# Patient Record
Sex: Male | Born: 1980 | Race: White | Hispanic: No | Marital: Single | Smoking: Never smoker
Health system: Southern US, Community
[De-identification: ages and names within clinical notes are randomized; demographics above are authoritative.]

## PROBLEM LIST (undated history)

## (undated) DIAGNOSIS — K55069 Acute infarction of intestine, part and extent unspecified: Secondary | ICD-10-CM

## (undated) DIAGNOSIS — Z7901 Long term (current) use of anticoagulants: Secondary | ICD-10-CM

## (undated) DIAGNOSIS — T7840XA Allergy, unspecified, initial encounter: Secondary | ICD-10-CM

## (undated) DIAGNOSIS — D509 Iron deficiency anemia, unspecified: Secondary | ICD-10-CM

## (undated) DIAGNOSIS — D649 Anemia, unspecified: Secondary | ICD-10-CM

## (undated) DIAGNOSIS — S62609A Fracture of unspecified phalanx of unspecified finger, initial encounter for closed fracture: Secondary | ICD-10-CM

## (undated) DIAGNOSIS — Z5189 Encounter for other specified aftercare: Secondary | ICD-10-CM

## (undated) DIAGNOSIS — I81 Portal vein thrombosis: Secondary | ICD-10-CM

## (undated) DIAGNOSIS — I2699 Other pulmonary embolism without acute cor pulmonale: Secondary | ICD-10-CM

## (undated) DIAGNOSIS — M25562 Pain in left knee: Secondary | ICD-10-CM

## (undated) DIAGNOSIS — D689 Coagulation defect, unspecified: Secondary | ICD-10-CM

## (undated) DIAGNOSIS — N200 Calculus of kidney: Secondary | ICD-10-CM

## (undated) DIAGNOSIS — K766 Portal hypertension: Secondary | ICD-10-CM

## (undated) DIAGNOSIS — I8500 Esophageal varices without bleeding: Secondary | ICD-10-CM

## (undated) DIAGNOSIS — R161 Splenomegaly, not elsewhere classified: Secondary | ICD-10-CM

## (undated) HISTORY — DX: Long term (current) use of anticoagulants: Z79.01

## (undated) HISTORY — DX: Other pulmonary embolism without acute cor pulmonale: I26.99

## (undated) HISTORY — DX: Coagulation defect, unspecified: D68.9

## (undated) HISTORY — DX: Splenomegaly, not elsewhere classified: R16.1

## (undated) HISTORY — PX: OTHER SURGICAL HISTORY: SHX169

## (undated) HISTORY — DX: Encounter for other specified aftercare: Z51.89

## (undated) HISTORY — DX: Fracture of unspecified phalanx of unspecified finger, initial encounter for closed fracture: S62.609A

## (undated) HISTORY — DX: Portal vein thrombosis: I81

## (undated) HISTORY — DX: Acute infarction of intestine, part and extent unspecified: K55.069

## (undated) HISTORY — DX: Allergy, unspecified, initial encounter: T78.40XA

## (undated) HISTORY — DX: Portal hypertension: K76.6

## (undated) HISTORY — DX: Esophageal varices without bleeding: I85.00

## (undated) HISTORY — DX: Anemia, unspecified: D64.9

## (undated) HISTORY — DX: Calculus of kidney: N20.0

## (undated) HISTORY — DX: Pain in left knee: M25.562

---
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
## 2004-04-14 ENCOUNTER — Ambulatory Visit: Payer: Self-pay | Admitting: Family Medicine

## 2004-07-02 ENCOUNTER — Ambulatory Visit: Payer: Self-pay | Admitting: Family Medicine

## 2005-02-11 ENCOUNTER — Ambulatory Visit: Payer: Self-pay | Admitting: Family Medicine

## 2005-02-17 ENCOUNTER — Ambulatory Visit: Payer: Self-pay | Admitting: Family Medicine

## 2005-03-24 ENCOUNTER — Ambulatory Visit: Payer: Self-pay | Admitting: Family Medicine

## 2007-11-14 ENCOUNTER — Ambulatory Visit: Payer: Self-pay | Admitting: Family Medicine

## 2007-11-15 ENCOUNTER — Encounter: Payer: Self-pay | Admitting: Family Medicine

## 2007-11-15 DIAGNOSIS — H6980 Other specified disorders of Eustachian tube, unspecified ear: Secondary | ICD-10-CM | POA: Insufficient documentation

## 2008-05-24 ENCOUNTER — Ambulatory Visit: Payer: Self-pay | Admitting: Family Medicine

## 2008-05-24 DIAGNOSIS — J309 Allergic rhinitis, unspecified: Secondary | ICD-10-CM | POA: Insufficient documentation

## 2008-06-05 ENCOUNTER — Encounter (INDEPENDENT_AMBULATORY_CARE_PROVIDER_SITE_OTHER): Payer: Self-pay | Admitting: Internal Medicine

## 2009-06-16 ENCOUNTER — Ambulatory Visit: Payer: Self-pay | Admitting: Internal Medicine

## 2009-06-16 DIAGNOSIS — L049 Acute lymphadenitis, unspecified: Secondary | ICD-10-CM | POA: Insufficient documentation

## 2009-10-07 ENCOUNTER — Encounter (INDEPENDENT_AMBULATORY_CARE_PROVIDER_SITE_OTHER): Payer: Self-pay | Admitting: *Deleted

## 2010-04-02 NOTE — Assessment & Plan Note (Signed)
Summary: ST/CLE   Vital Signs:  Patient profile:   30 year old male Weight:      212 pounds BMI:     28.86 Temp:     98.3 degrees F oral Pulse rate:   80 / minute Pulse rhythm:   regular BP sitting:   118 / 70  (left arm) Cuff size:   regular  Vitals Entered By: Mervin Hack CMA Duncan Dull) (June 16, 2009 10:28 AM) CC: allergies/sinus/sore thoat   History of Present Illness: Bad cold vs. allergies cough, aching, sore throat, congestion slight clear sputum with cough started a couple of weeks ago took meds and seemed better (cold meds) now has tender swollen gland in left neck no fever no trouble swallowing  Coughed up slight blood yesterday with sore throat did not seem to come up from chest  Has been on immunotherapy since last May seems better this season   Allergies: No Known Drug Allergies  Past History:  Past medical, surgical, family and social histories (including risk factors) reviewed for relevance to current acute and chronic problems.  Past Medical History: Reviewed history from 11/15/2007 and no changes required. Allergic rhinitis(11/1996)  Past Surgical History: Reviewed history from 11/15/2007 and no changes required. FRACTURE OF LEFT WRIST:(10/1996)  Family History: Reviewed history from 11/15/2007 and no changes required. Father:A  Mother: A  BROTHER A CV: P UNCLE 4 MI / +MGM CVA AND MI EAV:[id number] DM: 3 GREAT AUNTS - ADULT ONSET GOUT/ARTHRITIS: PROSTATE CANCER: NEGATIVE BREAST/OVARIAN/UTERINE CANCER: + UTERINE CANCER + MGGM COLON CANCER: NEGATIVE LUNG CANCER: +MGF  EMPHYSEMA :+ PGF SKIN CANCER:  +PGM DEPRESSION: NEGATIVE ETOH/DRUG ABUSE: NEGATIVE OTHER: +MGM  Social History: Reviewed history from 11/15/2007 and no changes required. Marital Status: Children:  Occupation:   Review of Systems       had some stomach upset with original cold and loose stools better now  Physical Exam  General:  alert and normal appearance.    Head:  no sinus tenderness Ears:  R ear normal and L ear normal.   Nose:  mild congestion Mouth:  no erythema and no exudates.   Neck:  supple and no masses.  Small mildly tender left submental node Lungs:  normal respiratory effort and normal breath sounds.     Impression & Recommendations:  Problem # 1:  ACUTE LYMPHADENITIS (ICD-683) Assessment New can try warm compress but may be too deep for that will Rx with cephalexin  Complete Medication List: 1)  Cephalexin 500 Mg Caps (Cephalexin) .Marland Kitchen.. 1 cap by mouth three times a day for infected gland  Patient Instructions: 1)  Please schedule a follow-up appointment as needed .  Prescriptions: CEPHALEXIN 500 MG CAPS (CEPHALEXIN) 1 cap by mouth three times a day for infected gland  #30 x 0   Entered and Authorized by:   Cindee Salt MD   Signed by:   Cindee Salt MD on 06/16/2009   Method used:   Electronically to        [facility] [phone]* (retail)       [address]       [place], [place]  [address]       Ph: [phone] or [phone]       Fax: [phone]   RxID:   [id number]   Prior Medications: Current Allergies (reviewed today): No known allergies

## 2010-04-02 NOTE — Letter (Signed)
Summary: Nathan Rowland letter  [place] at [facility]  [address] [place], [place] [address]   Phone: [phone]  Fax: [phone]       10/07/2009 MRN: [id number]  JAKADEN OUZTS [address], [place]  [address]  Dear [name] [facility] - [place] and [place] announce the retirement of Nathan Rowland, M.D., from full-time practice at the [facility] office effective August 28, 2009 and his plans of returning part-time.  It is important to Nathan Rowland and to our practice that you understand that [facility] has seven physicians in our office for your health care needs.  We will continue to offer the same exceptional care that you have today.    Nathan Rowland has spoken to many of you about his plans for retirement and returning part-time in the fall.   We will continue to work with you through the transition to schedule appointments for you in the office and meet the high standards that [facility] is committed to.   Again, it is with great pleasure that we share the news that Nathan Rowland will return to [facility] at [facility] in October of 2011 with a reduced schedule.    If you have any questions, or would like to request an appointment with one of our physicians, please call us at [phone] and press the option for Scheduling an appointment.  We take pleasure in providing you with excellent patient care and look forward to seeing you at your next office visit.  Our [facility] Physicians are:  Nathan Rowland, M.D. Nathan Rowland, M.D. Nathan Rowland, M.D. Nathan Rowland, M.D. Nathan Rowland, M.D. Nathan Rowland, M.D. We proudly welcomed Nathan Rowland, M.D. and Nathan Rowland, M.D. to the practice in July/August 2011.  Sincerely,  [place] [facility]

## 2010-10-20 ENCOUNTER — Ambulatory Visit (INDEPENDENT_AMBULATORY_CARE_PROVIDER_SITE_OTHER): Payer: 59 | Admitting: Family Medicine

## 2010-10-20 ENCOUNTER — Encounter: Payer: Self-pay | Admitting: Family Medicine

## 2010-10-20 DIAGNOSIS — R05 Cough: Secondary | ICD-10-CM

## 2010-10-20 DIAGNOSIS — R059 Cough, unspecified: Secondary | ICD-10-CM

## 2010-10-20 MED ORDER — HYDROCODONE-HOMATROPINE 5-1.5 MG/5ML PO SYRP: 5.0000 mL | ORAL_SOLUTION | Freq: Four times a day (QID) | ORAL | Status: DC | PRN

## 2010-10-20 MED ORDER — BENZONATATE 200 MG PO CAPS: 200.0000 mg | ORAL_CAPSULE | Freq: Three times a day (TID) | ORAL | Status: AC | PRN

## 2010-10-20 NOTE — Progress Notes (Signed)
duration of symptoms: 2 weeks Rhinorrhea: no Congestion: no ear pain: no sore throat: no Cough: yes, mainly dry, occ sputum, worse at night Myalgias: no other concerns: no FCNAV Nonsmoker.   Had some hycodan left over, this helped some, but he ran out.   He doesn't feel sick, except for the cough.    ROS: See HPI.  Otherwise negative.    Meds, vitals, and allergies reviewed.   GEN: nad, alert and oriented HEENT: mucous membranes moist, TM w/o erythema, nasal epithelium not injected, OP with cobblestoning but no exudates NECK: supple w/o LA CV: rrr. PULM: ctab, no inc wob ABD: soft, +bs EXT: no edema

## 2010-10-20 NOTE — Patient Instructions (Signed)
Use the hycodan for cough- it can make you drowsy.  Take tessalon during the day (it shouldn't make you drowsy).  This should gradually get better.  If it doesn't, then let us know.  Take care.  Glad to see you.

## 2010-10-21 ENCOUNTER — Encounter: Payer: Self-pay | Admitting: Family Medicine

## 2010-10-21 DIAGNOSIS — R059 Cough, unspecified: Secondary | ICD-10-CM | POA: Insufficient documentation

## 2010-10-21 DIAGNOSIS — R05 Cough: Secondary | ICD-10-CM | POA: Insufficient documentation

## 2010-10-21 NOTE — Assessment & Plan Note (Signed)
Likely viral, prev with some relief with hycodan. I don't think atypical PNA is likely. ddx d/w pt, supportive tx in meantime and f/u prn. Well appearing. He agrees. Sedation caution on meds.

## 2010-12-17 ENCOUNTER — Ambulatory Visit (INDEPENDENT_AMBULATORY_CARE_PROVIDER_SITE_OTHER): Payer: 59 | Admitting: Family Medicine

## 2010-12-17 ENCOUNTER — Ambulatory Visit (INDEPENDENT_AMBULATORY_CARE_PROVIDER_SITE_OTHER)
Admission: RE | Admit: 2010-12-17 | Discharge: 2010-12-17 | Disposition: A | Discharge status: Home / Self Care | Payer: 59 | Source: Ambulatory Visit | Attending: Family Medicine | Admitting: Family Medicine

## 2010-12-17 ENCOUNTER — Encounter: Payer: Self-pay | Admitting: Family Medicine

## 2010-12-17 VITALS — BP 112/70 | HR 80 | Temp 98.6°F | Wt 199.2 lb

## 2010-12-17 DIAGNOSIS — S99919A Unspecified injury of unspecified ankle, initial encounter: Secondary | ICD-10-CM

## 2010-12-17 DIAGNOSIS — S99921A Unspecified injury of right foot, initial encounter: Secondary | ICD-10-CM

## 2010-12-17 DIAGNOSIS — S8990XA Unspecified injury of unspecified lower leg, initial encounter: Secondary | ICD-10-CM

## 2010-12-17 NOTE — Assessment & Plan Note (Addendum)
R 2nd toe injury Xray - possible distal phalanx fx, no dislocation or interarticular fx. Concern for nail root dislocation - refer to podiatry for eval/treatment.

## 2010-12-17 NOTE — Patient Instructions (Addendum)
I'm worried about nail root dislocation. Pass by Marion's office to make appointment to see podiatrist.  I'd like to get you seen today if possible. I do think there was a small distal phalanx fracture.  If any change in plan based on radiology read, we will call you. Call us with questions.

## 2010-12-17 NOTE — Progress Notes (Signed)
  Subjective:    Patient ID: Nathan Rowland, male    DOB: 12-Dec-1980, 30 y.o.   MRN: [id number]  HPI CC: foot injury  Dropped microwave glass on right 2nd toe last night.  Bleeding, toenail seems to be coming off.  Took advil, helped pain some.  Now numb.   Has had toe fractures in past.  Review of Systems Per HPI    Objective:   Physical Exam  Nursing note and vitals reviewed. Constitutional: He appears well-developed and well-nourished. No distress.  Musculoskeletal:       2nd right toe distally swollen, some oozing under nail. Nail root with question of dislocation, cuticle torn Neurovascularly intact.  Skin: Skin is warm and dry.      Assessment & Plan:

## 2011-03-08 ENCOUNTER — Encounter: Payer: Self-pay | Admitting: Family Medicine

## 2011-03-08 ENCOUNTER — Ambulatory Visit (INDEPENDENT_AMBULATORY_CARE_PROVIDER_SITE_OTHER): Payer: 59 | Admitting: Family Medicine

## 2011-03-08 DIAGNOSIS — J069 Acute upper respiratory infection, unspecified: Secondary | ICD-10-CM

## 2011-03-08 MED ORDER — HYDROCODONE-HOMATROPINE 5-1.5 MG/5ML PO SYRP: 5.0000 mL | ORAL_SOLUTION | Freq: Four times a day (QID) | ORAL | Status: AC | PRN

## 2011-03-08 NOTE — Patient Instructions (Addendum)
I would get a flu shot each fall (and this winter when you are feeling better).   Get some rest and use the hycodan for cough.  Use nasal saline and gargle with salt water for your throat.    Back to work when not needing hycodan.  This is likely a virus and should gradually improve.

## 2011-03-08 NOTE — Progress Notes (Signed)
duration of symptoms: 5 days Rhinorrhea: no Congestion: yes ear pain: no sore throat: yes, from post nasal gtt Cough: yes, some, with brown sputum cleared from throat Myalgias: no other concerns:no fevers No sick contacts On allergy shots, no other meds except for leftover hycodan, which helps with cough w/o ADE.    ROS: See HPI.  Otherwise negative.    Meds, vitals, and allergies reviewed.   GEN: nad, alert and oriented HEENT: mucous membranes moist, TM w/o erythema but SOM noted, nasal epithelium injected, OP with cobblestoning, sinuses not ttp x4 NECK: supple w/o LA CV: rrr. PULM: ctab, no inc wob ABD: soft, +bs EXT: no edema

## 2011-03-09 ENCOUNTER — Encounter: Payer: Self-pay | Admitting: Family Medicine

## 2011-03-09 DIAGNOSIS — J069 Acute upper respiratory infection, unspecified: Secondary | ICD-10-CM | POA: Insufficient documentation

## 2011-03-09 NOTE — Assessment & Plan Note (Signed)
Nontoxic, likely viral,  Refill hycodan Declines nasal steroid  Use nasal saline, supportive tx o/w.  F/u prn.  ddx d/w pt.

## 2012-05-30 ENCOUNTER — Ambulatory Visit (INDEPENDENT_AMBULATORY_CARE_PROVIDER_SITE_OTHER): Payer: 59 | Admitting: Family Medicine

## 2012-05-30 ENCOUNTER — Encounter: Payer: Self-pay | Admitting: Family Medicine

## 2012-05-30 VITALS — BP 124/76 | HR 96 | Temp 98.4°F | Ht 72.0 in | Wt 216.5 lb

## 2012-05-30 DIAGNOSIS — Z0100 Encounter for examination of eyes and vision without abnormal findings: Secondary | ICD-10-CM

## 2012-05-30 DIAGNOSIS — R319 Hematuria, unspecified: Secondary | ICD-10-CM

## 2012-05-30 DIAGNOSIS — Z029 Encounter for administrative examinations, unspecified: Secondary | ICD-10-CM

## 2012-05-30 DIAGNOSIS — Z0110 Encounter for examination of ears and hearing without abnormal findings: Secondary | ICD-10-CM

## 2012-05-30 LAB — POCT URINALYSIS DIPSTICK
Bilirubin, UA: NEGATIVE
Glucose, UA: NEGATIVE
Ketones, UA: NEGATIVE
Leukocytes, UA: NEGATIVE
Nitrite, UA: NEGATIVE
pH, UA: 6

## 2012-05-30 NOTE — Progress Notes (Signed)
DOT physical.  See scanned forms.  No contraindication to driving based on history.   PMH and SH reviewed  Meds, vitals, and allergies reviewed.   ROS: See HPI.  Otherwise negative.    GEN: nad, alert and oriented HEENT: mucous membranes moist NECK: supple w/o LA CV: rrr. PULM: ctab, no inc wob ABD: soft, +bs EXT: no edema SKIN: no acute rash See DOT form for other details.

## 2012-05-30 NOTE — Patient Instructions (Signed)
Please talk to the front about getting a records release from [facility] (records since 2000).   We'll contact you with your follow up lab report. Take care.

## 2012-05-31 DIAGNOSIS — Z029 Encounter for administrative examinations, unspecified: Secondary | ICD-10-CM | POA: Insufficient documentation

## 2012-05-31 LAB — URINALYSIS, MICROSCOPIC ONLY
Bacteria, UA: NONE SEEN
Crystals: NONE SEEN
Squamous Epithelial / LPF: NONE SEEN

## 2012-05-31 LAB — URINE CULTURE: Organism ID, Bacteria: NO GROWTH

## 2012-05-31 NOTE — Assessment & Plan Note (Signed)
See DOT forms, scanned.  Okay for 2 years based on exam.   Tetanus <10 years per patient.   He had seen ortho years ago, we'll request records.   Flu shot encouraged.  His f/u urine micro was normal, ucx pending.  If neg then likely false positive.  D/w pt about the possibility of false positives. He understood.   F/u prn.

## 2013-04-23 ENCOUNTER — Ambulatory Visit (INDEPENDENT_AMBULATORY_CARE_PROVIDER_SITE_OTHER): Payer: 59 | Admitting: Family Medicine

## 2013-04-23 ENCOUNTER — Encounter: Payer: Self-pay | Admitting: Family Medicine

## 2013-04-23 VITALS — BP 114/80 | HR 80 | Temp 98.4°F | Ht 72.0 in | Wt 218.8 lb

## 2013-04-23 DIAGNOSIS — S8990XA Unspecified injury of unspecified lower leg, initial encounter: Secondary | ICD-10-CM

## 2013-04-23 DIAGNOSIS — S99921A Unspecified injury of right foot, initial encounter: Secondary | ICD-10-CM

## 2013-04-23 DIAGNOSIS — S99919A Unspecified injury of unspecified ankle, initial encounter: Secondary | ICD-10-CM

## 2013-04-23 DIAGNOSIS — S99929A Unspecified injury of unspecified foot, initial encounter: Secondary | ICD-10-CM

## 2013-04-23 MED ORDER — DICLOFENAC SODIUM 75 MG PO TBEC: 75.0000 mg | DELAYED_RELEASE_TABLET | Freq: Two times a day (BID) | ORAL | Status: DC

## 2013-04-23 NOTE — Progress Notes (Signed)
Pre visit review using our clinic review tool, if applicable. No additional management support is needed unless otherwise documented below in the visit note. 

## 2013-04-23 NOTE — Progress Notes (Signed)
Date:  04/23/2013   Name:  Nathan Rowland   DOB:  Jul 13, 1980   MRN:  [id number]  Primary Physician:  Crawford GivensGraham Duncan, MD   Chief Complaint: Foot Pain   Subjective:   History of Present Illness:  Nathan Rowland is a 33 y.o. pleasant patient who presents with the following:  Sharp pain starting last week R forefoot. Broken a lot of toes.   Pleasant gentleman with RIGHT forefoot pain, concentrated in the 1st MTP on the RIGHT. There is no redness or warmth associated with it. He has no history of trauma whatsoever. He is quite active however. No known history of gout. No history of pseudogout, no other history of significant injury to this joint. He does have a remote history of toe fracture, but not adjacent to this MTP.  He has been able to work, it does feel better when he is wearing steel toed boots.  1st ray post HP insoles  Capsule 1st r  Patient Active Problem List   Diagnosis Date Noted  . Administrative encounter 05/31/2012  . Injury of right toe 12/17/2010  . ALLERGIC RHINITIS 05/24/2008  . 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 Allergy 11/1996  . Finger fracture   . Left knee pain     dx'd with L retropatellar knee pain    No past surgical history on file.  History   Social History  . Marital Status: Single    Spouse Name: N/A    Number of Children: N/A  . Years of Education: N/A   Occupational History  . Not on file.   Social History Main Topics  . Smoking status: Never Smoker   . Smokeless tobacco: Never Used  . Alcohol Use: Yes     Comment: occ  . Drug Use: No  . Sexual Activity: Not on file   Other Topics Concern  . 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 Heart disease Maternal Grandmother     MI  . Stroke Maternal Grandmother   . Cancer Maternal Grandfather     Lung  .  Cancer Paternal Grandmother     Skin  . Emphysema Paternal Grandfather   . Hypertension Neg Hx   . Depression Neg Hx   . Alcohol abuse Neg Hx   . Drug abuse Neg Hx   . Prostate cancer Neg Hx   . Colon cancer Neg Hx   . Diabetes Other     3 great aunts, adult onset  . Cancer Other     Uterine    No Known Allergies  Medication list has been reviewed and updated.  Review of Systems:  GEN: No fevers, chills. Nontoxic. Primarily MSK c/o today. MSK: Detailed in the HPI GI: tolerating PO intake without difficulty Neuro: No numbness, parasthesias, or tingling associated. Otherwise the pertinent positives of the ROS are noted above.   Objective:   Physical Examination: BP 114/80  Pulse 80  Temp(Src) 98.4 F (36.9 C) (Oral)  Ht 6' (1.829 m)  Wt 218 lb 12 oz (99.224 kg)  BMI 29.66 kg/m2  Ideal Body Weight: Weight in (lb) to have BMI = 25: 183.9   GEN: WDWN, NAD, Non-toxic, Alert & Oriented x 3 HEENT: Atraumatic, Normocephalic.  Ears and Nose: No external deformity. EXTR: No  clubbing/cyanosis/edema NEURO: Normal gait.  PSYCH: Normally interactive. Conversant. Not depressed or anxious appearing.  Calm demeanor.   FEET: r Echymosis: no Edema: no ROM: full LE B Gait: heel toe, non-antalgic MT pain: no Callus pattern: none Lateral Mall: NT Medial Mall: NT Talus: NT Navicular: NT Cuboid: NT Calcaneous: NT Metatarsals: 1ST MTP TENDER ALONG JOINT LINE 5th MT: NT Phalanges: NT Achilles: NT Plantar Fascia: NT Fat Pad: NT Peroneals: NT Post Tib: NT Great Toe: painful terminal motion Ant Drawer: neg ATFL: NT CFL: NT Deltoid: NT Other foot breakdown: none Long arch: preserved Transverse arch: preserved Hindfoot breakdown: none Sensation: intact   No results found.  Assessment & Plan:   Injury of right toe  Most likely capsular injury. Suspect will heal with relative rest. Given sports insoles and placed a 1st ray post with partial MT bar under 1st.  New  Prescriptions   DICLOFENAC (VOLTAREN) 75 MG EC TABLET    Take 1 tablet (75 mg total) by mouth 2 (two) times daily.   No orders of the defined types were placed in this encounter.   Signed,  Elpidio Galea. Noor Witte, MD, CAQ Sports Medicine  [facility] [address] [place] [address] Phone: [phone] Fax: [phone]  There are no Patient Instructions on file for this visit. Patient's Medications  New Prescriptions   DICLOFENAC (VOLTAREN) 75 MG EC TABLET    Take 1 tablet (75 mg total) by mouth 2 (two) times daily.  Previous Medications   No medications on file  Modified Medications   No medications on file  Discontinued Medications   No medications on file

## 2013-10-01 ENCOUNTER — Encounter: Payer: Self-pay | Admitting: Family Medicine

## 2013-10-01 ENCOUNTER — Ambulatory Visit (INDEPENDENT_AMBULATORY_CARE_PROVIDER_SITE_OTHER): Payer: 59 | Admitting: Family Medicine

## 2013-10-01 VITALS — BP 100/80 | HR 72 | Temp 98.0°F | Ht 72.0 in | Wt 220.5 lb

## 2013-10-01 DIAGNOSIS — M545 Low back pain, unspecified: Secondary | ICD-10-CM

## 2013-10-01 MED ORDER — TIZANIDINE HCL 4 MG PO TABS: 4.0000 mg | ORAL_TABLET | Freq: Every evening | ORAL | Status: DC

## 2013-10-01 MED ORDER — PREDNISONE 20 MG PO TABS: ORAL_TABLET | ORAL | Status: DC

## 2013-10-01 NOTE — Progress Notes (Signed)
Pre visit review using our clinic review tool, if applicable. No additional management support is needed unless otherwise documented below in the visit note. 

## 2013-10-01 NOTE — Progress Notes (Signed)
[address] [place] [address]                Phone: [phone]                  Fax: [phone] 10/01/2013  ID: Nathan Rowland   MRN: [id number]  DOB: [birth year]  Primary Physician:  Crawford Givens, MD  Chief Complaint: Back Pain  Subjective:   History of Present Illness:  Nathan Rowland is a 33 y.o. very pleasant male patient who presents with the following: Back Pain  ongoing for approximately: 3-4 weeks The patient has had back pain before. The back pain is localized into the lumbar spine area. They also describe no radiculopathy.  Was thinking first and lifting the first week in July, has been dealing with it and has seemed to get worse. Sat was not that bad, off and on -  Both sides. No incontinence.   No numbness or tingling. No bowel or bladder incontinence. No focal weakness. Prior interventions: voltaren, tylenol Physical therapy: No Chiropractic manipulations: No Acupuncture: No Osteopathic manipulation: No Heat or cold: Minimal effect  Past Medical History, Surgical History, Family History, Medications, Allergies have been reviewed and updated if relevant.  Review of Systems  GEN: No fevers, chills. Nontoxic. Primarily MSK c/o today. MSK: Detailed in the HPI GI: tolerating PO intake without difficulty Neuro: As above  Otherwise the pertinent positives of the ROS are noted above.     Objective:   Physical Exam Filed Vitals:   10/01/13 0929  BP: 100/80  Pulse: 72  Temp: 98 F (36.7 C)  TempSrc: Oral  Height: 6' (1.829 m)  Weight: 220 lb 8 oz (100.018 kg)    Gen: Well-developed,well-nourished,in no acute distress; alert,appropriate and cooperative throughout examination HEENT: Normocephalic and atraumatic without obvious abnormalities.  Ears, externally no deformities Pulm: Breathing comfortably in no respiratory distress Range of motion at  the waist: Flexion, rotation and lateral bending: mild restriction all  directions  No echymosis or edema Rises to examination table with no difficulty Gait: minimally antalgic  Inspection/Deformity: No abnormality Paraspinus T:  L2-s1 b ten muscle  B Ankle Dorsiflexion (L5,4): 5/5 B Great Toe Dorsiflexion (L5,4): 5/5 Heel Walk (L5): WNL Toe Walk (S1): WNL Rise/Squat (L4): WNL, mild pain  SENSORY B Medial Foot (L4): WNL B Dorsum (L5): WNL B Lateral (S1): WNL Light Touch: WNL Pinprick: WNL  REFLEXES Knee (L4): 2+ Ankle (S1): 2+  B SLR, seated: neg B SLR, supine: neg B FABER: neg B Reverse FABER: neg B Greater Troch: NT B Log Roll: neg B Stork: NT B Sciatic Notch: NT  Radiology: No results found.  Assessment & Plan:   Bilateral low back pain without sciatica  I reviewed with the patient the anatomical structures involved.  Conservative algorithms for acute back pain generally begin with the following: NSAIDS, Muscle Relaxants, Mild pain medication -- failure NSAIDS. Start Pred burst, taper, ROM  Start with medications, core rehab, and progress from there following low back pain algorithm. No red flags are present.  New Prescriptions   PREDNISONE (DELTASONE) 20 MG TABLET    2 tabs po daily for 4 days, then 1 tab po for 4 days   TIZANIDINE (ZANAFLEX) 4 MG TABLET    Take 1 tablet (4 mg total) by mouth Nightly.   Discontinued Medications  No medications on file   Modified Medications   Modified Medication Previous Medication   DICLOFENAC (VOLTAREN) 75 MG EC TABLET diclofenac (VOLTAREN) 75 MG EC tablet      Take 75 mg by mouth 2 (two) times daily as needed.    Take 1 tablet (75 mg total) by mouth 2 (two) times daily.   No orders of the defined types were placed in this encounter.   Follow-up: No Follow-up on file. Unless noted above, the patient is to follow-up if symptoms worsen. Red flags were reviewed with the patient.  Signed,  Elpidio GaleaSpencer T. Siarra Gilkerson, MD, CAQ Sports Medicine  Current Medications at Discharge:     Medication List       This list is accurate as of: 10/01/13 11:59 PM.  Always use your most recent med list.               diclofenac 75 MG EC tablet  Commonly known as:  VOLTAREN  Take 75 mg by mouth 2 (two) times daily as needed.     predniSONE 20 MG tablet  Commonly known as:  DELTASONE  2 tabs po daily for 4 days, then 1 tab po for 4 days     tiZANidine 4 MG tablet  Commonly known as:  ZANAFLEX  Take 1 tablet (4 mg total) by mouth Nightly.

## 2016-05-19 ENCOUNTER — Ambulatory Visit (INDEPENDENT_AMBULATORY_CARE_PROVIDER_SITE_OTHER): Payer: Commercial Managed Care - HMO | Admitting: Family Medicine

## 2016-05-19 ENCOUNTER — Encounter (INDEPENDENT_AMBULATORY_CARE_PROVIDER_SITE_OTHER): Payer: Self-pay

## 2016-05-19 ENCOUNTER — Encounter: Payer: Self-pay | Admitting: Family Medicine

## 2016-05-19 VITALS — BP 120/84 | HR 74 | Temp 98.4°F | Ht 72.0 in | Wt 222.5 lb

## 2016-05-19 DIAGNOSIS — M7542 Impingement syndrome of left shoulder: Secondary | ICD-10-CM

## 2016-05-19 DIAGNOSIS — G2589 Other specified extrapyramidal and movement disorders: Secondary | ICD-10-CM | POA: Diagnosis not present

## 2016-05-19 DIAGNOSIS — M7582 Other shoulder lesions, left shoulder: Secondary | ICD-10-CM | POA: Diagnosis not present

## 2016-05-19 NOTE — Progress Notes (Signed)
Dr. Karleen HampshireSpencer T. Ryleigh Esqueda, MD, CAQ Sports Medicine Primary Care and Sports Medicine [address] [place], [id number] Phone: [phone] Fax: [phone]  05/19/2016  Patient: Nathan AnnaCameron R Wiehe, MRN: [id number], DOB: 1980/05/12, 36 y.o.  Primary Physician:  Crawford GivensGraham Duncan, MD   Chief Complaint  Patient presents with  . Shoulder Pain    Left   Subjective:   This 36 y.o. male patient noted above presents with L shoulder pain that has been ongoing for 8 mo scapular discomfort worsened in the shoulder the last few days. there is no history of trauma or accident recently The patient denies neck pain or radicular symptoms. Denies dislocation, subluxation, separation of the shoulder. The patient does complain of pain in the overhead plane with significant painful arc of motion.  8-9 months, had some shoulder blade pain, trouble sleeping on his side. Yesterday had some bad pain yesterday.   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  Medications Tried: Tylenol, NSAIDS Ice or Heat: minimally helpful Tried PT: No  Prior shoulder Injury: No Prior surgery: No Prior fracture: No  The PMH, PSH, Social History, Family History, Medications, and allergies have been reviewed in [facility], and have been updated if relevant.  Patient Active Problem List   Diagnosis Date Noted  . Administrative encounter 05/31/2012  . Injury of right toe 12/17/2010  . ALLERGIC RHINITIS 05/24/2008  . 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 Allergy 11/1996  . Finger fracture   . Left knee pain    dx'd with L retropatellar knee pain    No past surgical history on file.  Social History   Social History  . Marital status: Single    Spouse name: N/A  . Number of children: N/A  . Years of education: N/A   Occupational History  . Not on file.   Social History Main Topics  . Smoking status: Never Smoker  . Smokeless tobacco: Never Used  . Alcohol use Yes       Comment: occ  . Drug use: No  . Sexual activity: Not on file   Other Topics Concern  . 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 Heart disease Maternal Grandmother     MI  . Stroke Maternal Grandmother   . Cancer Maternal Grandfather     Lung  . Cancer Paternal Grandmother     Skin  . Emphysema Paternal Grandfather   . Hypertension Neg Hx   . Depression Neg Hx   . Alcohol abuse Neg Hx   . Drug abuse Neg Hx   . Prostate cancer Neg Hx   . Colon cancer Neg Hx   . Diabetes Other     3 great aunts, adult onset  . Cancer Other     Uterine    No Known Allergies  Medication list reviewed and updated in full in [facility].  GEN: No fevers, chills. Nontoxic. Primarily MSK c/o today. MSK: Detailed in the HPI GI: tolerating PO intake without difficulty Neuro: No numbness, parasthesias, or tingling associated. Otherwise the pertinent positives of the ROS are noted above.   Objective:   Blood pressure 120/84, pulse 74, temperature 98.4 F (36.9 C), temperature source Oral, height 6' (1.829 m), weight 222 lb 8 oz (100.9 kg).  GEN: Well-developed,well-nourished,in no acute  distress; alert,appropriate and cooperative throughout examination HEENT: Normocephalic and atraumatic without obvious abnormalities. Ears, externally no deformities PULM: Breathing comfortably in no respiratory distress EXT: No clubbing, cyanosis, or edema PSYCH: Normally interactive. Cooperative during the interview. Pleasant. Friendly and conversant. Not anxious or depressed appearing. Normal, full affect.  Shoulder: L Inspection: No muscle wasting or winging Ecchymosis/edema: neg  AC joint, scapula, clavicle: NT Cervical spine: NT, full ROM Spurling's: neg Abduction: full, 5/5 Flexion: full, 5/5 IR, full, lift-off: 5/5 ER at neutral: full, 5/5 AC crossover: neg Neer: pos Hawkins: pos Drop  Test: neg Empty Can: pos Supraspinatus insertion: mild-mod T Bicipital groove: NT Speed's: neg Yergason's: neg Sulcus sign: neg Scapular dyskinesis: mild winging and elevation during abduction C5-T1 intact  Neuro: Sensation intact Grip 5/5   Radiology: No results found.  Assessment and Plan:    Shoulder impingement, left - Plan: Ambulatory referral to Physical Therapy  Rotator cuff tendonitis, left - Plan: Ambulatory referral to Physical Therapy  Scapular dyskinesis  >25 minutes spent in face to face time with patient, >50% spent in counselling or coordination of care   Rotator cuff strengthening and scapular stabilization exercises were reviewed with the patient.    Start with [place]  Retraining shoulder mechanics and function was emphasized to the patient with rehab done at least 5-6 days a week.  The patient could benefit from formal PT to assist with scapular stabilization and RTC strengthening.  Ibuprofen or alleve prn   Follow-up: if not improving in 4-6 weeks  Orders Placed This Encounter  Procedures  . Ambulatory referral to Physical Therapy    Signed,  Karleen Hampshire T. Camari Wisham, MD   Patient's Medications  New Prescriptions   No medications on file  Previous Medications   No medications on file  Modified Medications   No medications on file  Discontinued Medications   DICLOFENAC (VOLTAREN) 75 MG EC TABLET    Take 75 mg by mouth 2 (two) times daily as needed.   PREDNISONE (DELTASONE) 20 MG TABLET    2 tabs po daily for 4 days, then 1 tab po for 4 days   TIZANIDINE (ZANAFLEX) 4 MG TABLET    Take 1 tablet (4 mg total) by mouth Nightly.

## 2016-05-19 NOTE — Patient Instructions (Signed)

## 2016-05-19 NOTE — Progress Notes (Signed)
Pre visit review using our clinic review tool, if applicable. No additional management support is needed unless otherwise documented below in the visit note. 

## 2016-05-21 DIAGNOSIS — M25512 Pain in left shoulder: Secondary | ICD-10-CM | POA: Diagnosis not present

## 2016-05-21 DIAGNOSIS — M7542 Impingement syndrome of left shoulder: Secondary | ICD-10-CM | POA: Diagnosis not present

## 2016-05-21 DIAGNOSIS — S46012D Strain of muscle(s) and tendon(s) of the rotator cuff of left shoulder, subsequent encounter: Secondary | ICD-10-CM | POA: Diagnosis not present

## 2016-08-02 DIAGNOSIS — J3089 Other allergic rhinitis: Secondary | ICD-10-CM | POA: Diagnosis not present

## 2016-08-02 DIAGNOSIS — J3081 Allergic rhinitis due to animal (cat) (dog) hair and dander: Secondary | ICD-10-CM | POA: Diagnosis not present

## 2016-08-02 DIAGNOSIS — J301 Allergic rhinitis due to pollen: Secondary | ICD-10-CM | POA: Diagnosis not present

## 2016-08-10 DIAGNOSIS — J301 Allergic rhinitis due to pollen: Secondary | ICD-10-CM | POA: Diagnosis not present

## 2016-08-10 DIAGNOSIS — J3089 Other allergic rhinitis: Secondary | ICD-10-CM | POA: Diagnosis not present

## 2016-08-10 DIAGNOSIS — J3081 Allergic rhinitis due to animal (cat) (dog) hair and dander: Secondary | ICD-10-CM | POA: Diagnosis not present

## 2016-08-11 DIAGNOSIS — J3089 Other allergic rhinitis: Secondary | ICD-10-CM | POA: Diagnosis not present

## 2016-08-11 DIAGNOSIS — J301 Allergic rhinitis due to pollen: Secondary | ICD-10-CM | POA: Diagnosis not present

## 2016-08-11 DIAGNOSIS — J3081 Allergic rhinitis due to animal (cat) (dog) hair and dander: Secondary | ICD-10-CM | POA: Diagnosis not present

## 2016-08-20 DIAGNOSIS — J3081 Allergic rhinitis due to animal (cat) (dog) hair and dander: Secondary | ICD-10-CM | POA: Diagnosis not present

## 2016-08-20 DIAGNOSIS — J301 Allergic rhinitis due to pollen: Secondary | ICD-10-CM | POA: Diagnosis not present

## 2016-08-20 DIAGNOSIS — J3089 Other allergic rhinitis: Secondary | ICD-10-CM | POA: Diagnosis not present

## 2016-08-23 DIAGNOSIS — J3089 Other allergic rhinitis: Secondary | ICD-10-CM | POA: Diagnosis not present

## 2016-08-23 DIAGNOSIS — J301 Allergic rhinitis due to pollen: Secondary | ICD-10-CM | POA: Diagnosis not present

## 2016-08-23 DIAGNOSIS — J3081 Allergic rhinitis due to animal (cat) (dog) hair and dander: Secondary | ICD-10-CM | POA: Diagnosis not present

## 2016-08-25 DIAGNOSIS — J301 Allergic rhinitis due to pollen: Secondary | ICD-10-CM | POA: Diagnosis not present

## 2016-08-25 DIAGNOSIS — J3081 Allergic rhinitis due to animal (cat) (dog) hair and dander: Secondary | ICD-10-CM | POA: Diagnosis not present

## 2016-08-25 DIAGNOSIS — J3089 Other allergic rhinitis: Secondary | ICD-10-CM | POA: Diagnosis not present

## 2016-08-31 DIAGNOSIS — J301 Allergic rhinitis due to pollen: Secondary | ICD-10-CM | POA: Diagnosis not present

## 2016-08-31 DIAGNOSIS — J3081 Allergic rhinitis due to animal (cat) (dog) hair and dander: Secondary | ICD-10-CM | POA: Diagnosis not present

## 2016-08-31 DIAGNOSIS — J3089 Other allergic rhinitis: Secondary | ICD-10-CM | POA: Diagnosis not present

## 2016-09-03 DIAGNOSIS — J301 Allergic rhinitis due to pollen: Secondary | ICD-10-CM | POA: Diagnosis not present

## 2016-09-03 DIAGNOSIS — J3081 Allergic rhinitis due to animal (cat) (dog) hair and dander: Secondary | ICD-10-CM | POA: Diagnosis not present

## 2016-09-03 DIAGNOSIS — J3089 Other allergic rhinitis: Secondary | ICD-10-CM | POA: Diagnosis not present

## 2016-09-07 DIAGNOSIS — J3081 Allergic rhinitis due to animal (cat) (dog) hair and dander: Secondary | ICD-10-CM | POA: Diagnosis not present

## 2016-09-07 DIAGNOSIS — J3089 Other allergic rhinitis: Secondary | ICD-10-CM | POA: Diagnosis not present

## 2016-09-07 DIAGNOSIS — J301 Allergic rhinitis due to pollen: Secondary | ICD-10-CM | POA: Diagnosis not present

## 2016-09-09 DIAGNOSIS — J301 Allergic rhinitis due to pollen: Secondary | ICD-10-CM | POA: Diagnosis not present

## 2016-09-09 DIAGNOSIS — J3081 Allergic rhinitis due to animal (cat) (dog) hair and dander: Secondary | ICD-10-CM | POA: Diagnosis not present

## 2016-09-09 DIAGNOSIS — J3089 Other allergic rhinitis: Secondary | ICD-10-CM | POA: Diagnosis not present

## 2016-09-15 DIAGNOSIS — J301 Allergic rhinitis due to pollen: Secondary | ICD-10-CM | POA: Diagnosis not present

## 2016-09-15 DIAGNOSIS — J3089 Other allergic rhinitis: Secondary | ICD-10-CM | POA: Diagnosis not present

## 2016-09-15 DIAGNOSIS — J3081 Allergic rhinitis due to animal (cat) (dog) hair and dander: Secondary | ICD-10-CM | POA: Diagnosis not present

## 2016-09-24 DIAGNOSIS — J3081 Allergic rhinitis due to animal (cat) (dog) hair and dander: Secondary | ICD-10-CM | POA: Diagnosis not present

## 2016-09-24 DIAGNOSIS — J3089 Other allergic rhinitis: Secondary | ICD-10-CM | POA: Diagnosis not present

## 2016-09-24 DIAGNOSIS — J301 Allergic rhinitis due to pollen: Secondary | ICD-10-CM | POA: Diagnosis not present

## 2016-09-28 DIAGNOSIS — J3081 Allergic rhinitis due to animal (cat) (dog) hair and dander: Secondary | ICD-10-CM | POA: Diagnosis not present

## 2016-09-28 DIAGNOSIS — J301 Allergic rhinitis due to pollen: Secondary | ICD-10-CM | POA: Diagnosis not present

## 2016-09-28 DIAGNOSIS — J3089 Other allergic rhinitis: Secondary | ICD-10-CM | POA: Diagnosis not present

## 2016-10-06 DIAGNOSIS — J3089 Other allergic rhinitis: Secondary | ICD-10-CM | POA: Diagnosis not present

## 2016-10-06 DIAGNOSIS — J301 Allergic rhinitis due to pollen: Secondary | ICD-10-CM | POA: Diagnosis not present

## 2016-10-06 DIAGNOSIS — J3081 Allergic rhinitis due to animal (cat) (dog) hair and dander: Secondary | ICD-10-CM | POA: Diagnosis not present

## 2016-10-13 DIAGNOSIS — J301 Allergic rhinitis due to pollen: Secondary | ICD-10-CM | POA: Diagnosis not present

## 2016-10-13 DIAGNOSIS — J3081 Allergic rhinitis due to animal (cat) (dog) hair and dander: Secondary | ICD-10-CM | POA: Diagnosis not present

## 2016-10-13 DIAGNOSIS — J3089 Other allergic rhinitis: Secondary | ICD-10-CM | POA: Diagnosis not present

## 2016-10-15 DIAGNOSIS — J301 Allergic rhinitis due to pollen: Secondary | ICD-10-CM | POA: Diagnosis not present

## 2016-10-15 DIAGNOSIS — J3081 Allergic rhinitis due to animal (cat) (dog) hair and dander: Secondary | ICD-10-CM | POA: Diagnosis not present

## 2016-10-15 DIAGNOSIS — J3089 Other allergic rhinitis: Secondary | ICD-10-CM | POA: Diagnosis not present

## 2016-10-18 DIAGNOSIS — J3081 Allergic rhinitis due to animal (cat) (dog) hair and dander: Secondary | ICD-10-CM | POA: Diagnosis not present

## 2016-10-18 DIAGNOSIS — J3089 Other allergic rhinitis: Secondary | ICD-10-CM | POA: Diagnosis not present

## 2016-10-18 DIAGNOSIS — J301 Allergic rhinitis due to pollen: Secondary | ICD-10-CM | POA: Diagnosis not present

## 2016-10-21 DIAGNOSIS — J3081 Allergic rhinitis due to animal (cat) (dog) hair and dander: Secondary | ICD-10-CM | POA: Diagnosis not present

## 2016-10-21 DIAGNOSIS — J3089 Other allergic rhinitis: Secondary | ICD-10-CM | POA: Diagnosis not present

## 2016-10-21 DIAGNOSIS — J301 Allergic rhinitis due to pollen: Secondary | ICD-10-CM | POA: Diagnosis not present

## 2016-10-25 DIAGNOSIS — J3089 Other allergic rhinitis: Secondary | ICD-10-CM | POA: Diagnosis not present

## 2016-10-25 DIAGNOSIS — J301 Allergic rhinitis due to pollen: Secondary | ICD-10-CM | POA: Diagnosis not present

## 2016-10-25 DIAGNOSIS — J3081 Allergic rhinitis due to animal (cat) (dog) hair and dander: Secondary | ICD-10-CM | POA: Diagnosis not present

## 2016-11-02 DIAGNOSIS — J3081 Allergic rhinitis due to animal (cat) (dog) hair and dander: Secondary | ICD-10-CM | POA: Diagnosis not present

## 2016-11-02 DIAGNOSIS — J3089 Other allergic rhinitis: Secondary | ICD-10-CM | POA: Diagnosis not present

## 2016-11-02 DIAGNOSIS — J301 Allergic rhinitis due to pollen: Secondary | ICD-10-CM | POA: Diagnosis not present

## 2016-11-08 DIAGNOSIS — J3081 Allergic rhinitis due to animal (cat) (dog) hair and dander: Secondary | ICD-10-CM | POA: Diagnosis not present

## 2016-11-08 DIAGNOSIS — J3089 Other allergic rhinitis: Secondary | ICD-10-CM | POA: Diagnosis not present

## 2016-11-08 DIAGNOSIS — J301 Allergic rhinitis due to pollen: Secondary | ICD-10-CM | POA: Diagnosis not present

## 2016-11-18 DIAGNOSIS — J3089 Other allergic rhinitis: Secondary | ICD-10-CM | POA: Diagnosis not present

## 2016-11-18 DIAGNOSIS — J3081 Allergic rhinitis due to animal (cat) (dog) hair and dander: Secondary | ICD-10-CM | POA: Diagnosis not present

## 2016-11-18 DIAGNOSIS — J301 Allergic rhinitis due to pollen: Secondary | ICD-10-CM | POA: Diagnosis not present

## 2016-11-24 DIAGNOSIS — J3089 Other allergic rhinitis: Secondary | ICD-10-CM | POA: Diagnosis not present

## 2016-11-24 DIAGNOSIS — J3081 Allergic rhinitis due to animal (cat) (dog) hair and dander: Secondary | ICD-10-CM | POA: Diagnosis not present

## 2016-11-24 DIAGNOSIS — J301 Allergic rhinitis due to pollen: Secondary | ICD-10-CM | POA: Diagnosis not present

## 2016-11-29 DIAGNOSIS — J3081 Allergic rhinitis due to animal (cat) (dog) hair and dander: Secondary | ICD-10-CM | POA: Diagnosis not present

## 2016-11-29 DIAGNOSIS — J301 Allergic rhinitis due to pollen: Secondary | ICD-10-CM | POA: Diagnosis not present

## 2016-11-29 DIAGNOSIS — J3089 Other allergic rhinitis: Secondary | ICD-10-CM | POA: Diagnosis not present

## 2016-12-17 DIAGNOSIS — J3089 Other allergic rhinitis: Secondary | ICD-10-CM | POA: Diagnosis not present

## 2016-12-17 DIAGNOSIS — J3081 Allergic rhinitis due to animal (cat) (dog) hair and dander: Secondary | ICD-10-CM | POA: Diagnosis not present

## 2016-12-17 DIAGNOSIS — J301 Allergic rhinitis due to pollen: Secondary | ICD-10-CM | POA: Diagnosis not present

## 2016-12-23 DIAGNOSIS — J3081 Allergic rhinitis due to animal (cat) (dog) hair and dander: Secondary | ICD-10-CM | POA: Diagnosis not present

## 2016-12-23 DIAGNOSIS — J3089 Other allergic rhinitis: Secondary | ICD-10-CM | POA: Diagnosis not present

## 2016-12-23 DIAGNOSIS — J301 Allergic rhinitis due to pollen: Secondary | ICD-10-CM | POA: Diagnosis not present

## 2017-01-04 DIAGNOSIS — J3081 Allergic rhinitis due to animal (cat) (dog) hair and dander: Secondary | ICD-10-CM | POA: Diagnosis not present

## 2017-01-04 DIAGNOSIS — J3089 Other allergic rhinitis: Secondary | ICD-10-CM | POA: Diagnosis not present

## 2017-01-04 DIAGNOSIS — J301 Allergic rhinitis due to pollen: Secondary | ICD-10-CM | POA: Diagnosis not present

## 2017-01-10 DIAGNOSIS — J301 Allergic rhinitis due to pollen: Secondary | ICD-10-CM | POA: Diagnosis not present

## 2017-01-10 DIAGNOSIS — J3081 Allergic rhinitis due to animal (cat) (dog) hair and dander: Secondary | ICD-10-CM | POA: Diagnosis not present

## 2017-01-10 DIAGNOSIS — J3089 Other allergic rhinitis: Secondary | ICD-10-CM | POA: Diagnosis not present

## 2017-01-13 DIAGNOSIS — J301 Allergic rhinitis due to pollen: Secondary | ICD-10-CM | POA: Diagnosis not present

## 2017-01-13 DIAGNOSIS — J3081 Allergic rhinitis due to animal (cat) (dog) hair and dander: Secondary | ICD-10-CM | POA: Diagnosis not present

## 2017-01-13 DIAGNOSIS — J3089 Other allergic rhinitis: Secondary | ICD-10-CM | POA: Diagnosis not present

## 2017-01-17 DIAGNOSIS — J3081 Allergic rhinitis due to animal (cat) (dog) hair and dander: Secondary | ICD-10-CM | POA: Diagnosis not present

## 2017-01-17 DIAGNOSIS — J3089 Other allergic rhinitis: Secondary | ICD-10-CM | POA: Diagnosis not present

## 2017-01-17 DIAGNOSIS — J301 Allergic rhinitis due to pollen: Secondary | ICD-10-CM | POA: Diagnosis not present

## 2017-01-25 DIAGNOSIS — J3081 Allergic rhinitis due to animal (cat) (dog) hair and dander: Secondary | ICD-10-CM | POA: Diagnosis not present

## 2017-01-25 DIAGNOSIS — J301 Allergic rhinitis due to pollen: Secondary | ICD-10-CM | POA: Diagnosis not present

## 2017-01-25 DIAGNOSIS — J3089 Other allergic rhinitis: Secondary | ICD-10-CM | POA: Diagnosis not present

## 2017-01-28 DIAGNOSIS — J3089 Other allergic rhinitis: Secondary | ICD-10-CM | POA: Diagnosis not present

## 2017-01-28 DIAGNOSIS — J3081 Allergic rhinitis due to animal (cat) (dog) hair and dander: Secondary | ICD-10-CM | POA: Diagnosis not present

## 2017-01-28 DIAGNOSIS — J301 Allergic rhinitis due to pollen: Secondary | ICD-10-CM | POA: Diagnosis not present

## 2017-02-03 DIAGNOSIS — J3081 Allergic rhinitis due to animal (cat) (dog) hair and dander: Secondary | ICD-10-CM | POA: Diagnosis not present

## 2017-02-03 DIAGNOSIS — J3089 Other allergic rhinitis: Secondary | ICD-10-CM | POA: Diagnosis not present

## 2017-02-03 DIAGNOSIS — J301 Allergic rhinitis due to pollen: Secondary | ICD-10-CM | POA: Diagnosis not present

## 2017-02-15 DIAGNOSIS — J3089 Other allergic rhinitis: Secondary | ICD-10-CM | POA: Diagnosis not present

## 2017-02-15 DIAGNOSIS — J301 Allergic rhinitis due to pollen: Secondary | ICD-10-CM | POA: Diagnosis not present

## 2017-02-15 DIAGNOSIS — J3081 Allergic rhinitis due to animal (cat) (dog) hair and dander: Secondary | ICD-10-CM | POA: Diagnosis not present

## 2017-03-03 DIAGNOSIS — J3089 Other allergic rhinitis: Secondary | ICD-10-CM | POA: Diagnosis not present

## 2017-03-03 DIAGNOSIS — J3081 Allergic rhinitis due to animal (cat) (dog) hair and dander: Secondary | ICD-10-CM | POA: Diagnosis not present

## 2017-03-03 DIAGNOSIS — J301 Allergic rhinitis due to pollen: Secondary | ICD-10-CM | POA: Diagnosis not present

## 2017-03-10 DIAGNOSIS — J3081 Allergic rhinitis due to animal (cat) (dog) hair and dander: Secondary | ICD-10-CM | POA: Diagnosis not present

## 2017-03-10 DIAGNOSIS — J3089 Other allergic rhinitis: Secondary | ICD-10-CM | POA: Diagnosis not present

## 2017-03-10 DIAGNOSIS — J301 Allergic rhinitis due to pollen: Secondary | ICD-10-CM | POA: Diagnosis not present

## 2017-03-15 DIAGNOSIS — J3081 Allergic rhinitis due to animal (cat) (dog) hair and dander: Secondary | ICD-10-CM | POA: Diagnosis not present

## 2017-03-15 DIAGNOSIS — J301 Allergic rhinitis due to pollen: Secondary | ICD-10-CM | POA: Diagnosis not present

## 2017-03-15 DIAGNOSIS — J3089 Other allergic rhinitis: Secondary | ICD-10-CM | POA: Diagnosis not present

## 2017-03-23 DIAGNOSIS — J3081 Allergic rhinitis due to animal (cat) (dog) hair and dander: Secondary | ICD-10-CM | POA: Diagnosis not present

## 2017-03-23 DIAGNOSIS — J301 Allergic rhinitis due to pollen: Secondary | ICD-10-CM | POA: Diagnosis not present

## 2017-03-23 DIAGNOSIS — J3089 Other allergic rhinitis: Secondary | ICD-10-CM | POA: Diagnosis not present

## 2017-03-31 DIAGNOSIS — J301 Allergic rhinitis due to pollen: Secondary | ICD-10-CM | POA: Diagnosis not present

## 2017-03-31 DIAGNOSIS — J3081 Allergic rhinitis due to animal (cat) (dog) hair and dander: Secondary | ICD-10-CM | POA: Diagnosis not present

## 2017-04-01 DIAGNOSIS — J3089 Other allergic rhinitis: Secondary | ICD-10-CM | POA: Diagnosis not present

## 2017-04-05 DIAGNOSIS — J3089 Other allergic rhinitis: Secondary | ICD-10-CM | POA: Diagnosis not present

## 2017-04-05 DIAGNOSIS — J301 Allergic rhinitis due to pollen: Secondary | ICD-10-CM | POA: Diagnosis not present

## 2017-04-05 DIAGNOSIS — J3081 Allergic rhinitis due to animal (cat) (dog) hair and dander: Secondary | ICD-10-CM | POA: Diagnosis not present

## 2017-04-14 DIAGNOSIS — J301 Allergic rhinitis due to pollen: Secondary | ICD-10-CM | POA: Diagnosis not present

## 2017-04-14 DIAGNOSIS — J3089 Other allergic rhinitis: Secondary | ICD-10-CM | POA: Diagnosis not present

## 2017-04-14 DIAGNOSIS — J3081 Allergic rhinitis due to animal (cat) (dog) hair and dander: Secondary | ICD-10-CM | POA: Diagnosis not present

## 2017-04-29 DIAGNOSIS — J3089 Other allergic rhinitis: Secondary | ICD-10-CM | POA: Diagnosis not present

## 2017-04-29 DIAGNOSIS — J301 Allergic rhinitis due to pollen: Secondary | ICD-10-CM | POA: Diagnosis not present

## 2017-04-29 DIAGNOSIS — J3081 Allergic rhinitis due to animal (cat) (dog) hair and dander: Secondary | ICD-10-CM | POA: Diagnosis not present

## 2017-05-05 DIAGNOSIS — J301 Allergic rhinitis due to pollen: Secondary | ICD-10-CM | POA: Diagnosis not present

## 2017-05-05 DIAGNOSIS — J3081 Allergic rhinitis due to animal (cat) (dog) hair and dander: Secondary | ICD-10-CM | POA: Diagnosis not present

## 2017-05-05 DIAGNOSIS — J3089 Other allergic rhinitis: Secondary | ICD-10-CM | POA: Diagnosis not present

## 2017-05-13 DIAGNOSIS — J3089 Other allergic rhinitis: Secondary | ICD-10-CM | POA: Diagnosis not present

## 2017-05-13 DIAGNOSIS — J301 Allergic rhinitis due to pollen: Secondary | ICD-10-CM | POA: Diagnosis not present

## 2017-05-13 DIAGNOSIS — J3081 Allergic rhinitis due to animal (cat) (dog) hair and dander: Secondary | ICD-10-CM | POA: Diagnosis not present

## 2017-05-16 DIAGNOSIS — J3089 Other allergic rhinitis: Secondary | ICD-10-CM | POA: Diagnosis not present

## 2017-05-16 DIAGNOSIS — J301 Allergic rhinitis due to pollen: Secondary | ICD-10-CM | POA: Diagnosis not present

## 2017-05-16 DIAGNOSIS — J3081 Allergic rhinitis due to animal (cat) (dog) hair and dander: Secondary | ICD-10-CM | POA: Diagnosis not present

## 2017-05-19 DIAGNOSIS — J301 Allergic rhinitis due to pollen: Secondary | ICD-10-CM | POA: Diagnosis not present

## 2017-05-19 DIAGNOSIS — J3081 Allergic rhinitis due to animal (cat) (dog) hair and dander: Secondary | ICD-10-CM | POA: Diagnosis not present

## 2017-05-19 DIAGNOSIS — J3089 Other allergic rhinitis: Secondary | ICD-10-CM | POA: Diagnosis not present

## 2017-05-25 DIAGNOSIS — J3089 Other allergic rhinitis: Secondary | ICD-10-CM | POA: Diagnosis not present

## 2017-05-25 DIAGNOSIS — J301 Allergic rhinitis due to pollen: Secondary | ICD-10-CM | POA: Diagnosis not present

## 2017-05-25 DIAGNOSIS — J3081 Allergic rhinitis due to animal (cat) (dog) hair and dander: Secondary | ICD-10-CM | POA: Diagnosis not present

## 2017-06-06 DIAGNOSIS — J3089 Other allergic rhinitis: Secondary | ICD-10-CM | POA: Diagnosis not present

## 2017-06-06 DIAGNOSIS — J3081 Allergic rhinitis due to animal (cat) (dog) hair and dander: Secondary | ICD-10-CM | POA: Diagnosis not present

## 2017-06-06 DIAGNOSIS — J301 Allergic rhinitis due to pollen: Secondary | ICD-10-CM | POA: Diagnosis not present

## 2017-06-24 DIAGNOSIS — J301 Allergic rhinitis due to pollen: Secondary | ICD-10-CM | POA: Diagnosis not present

## 2017-06-24 DIAGNOSIS — J3081 Allergic rhinitis due to animal (cat) (dog) hair and dander: Secondary | ICD-10-CM | POA: Diagnosis not present

## 2017-06-24 DIAGNOSIS — J3089 Other allergic rhinitis: Secondary | ICD-10-CM | POA: Diagnosis not present

## 2017-07-06 DIAGNOSIS — J301 Allergic rhinitis due to pollen: Secondary | ICD-10-CM | POA: Diagnosis not present

## 2017-07-06 DIAGNOSIS — J3081 Allergic rhinitis due to animal (cat) (dog) hair and dander: Secondary | ICD-10-CM | POA: Diagnosis not present

## 2017-07-06 DIAGNOSIS — J3089 Other allergic rhinitis: Secondary | ICD-10-CM | POA: Diagnosis not present

## 2017-07-12 DIAGNOSIS — J301 Allergic rhinitis due to pollen: Secondary | ICD-10-CM | POA: Diagnosis not present

## 2017-07-12 DIAGNOSIS — J3089 Other allergic rhinitis: Secondary | ICD-10-CM | POA: Diagnosis not present

## 2017-07-12 DIAGNOSIS — J3081 Allergic rhinitis due to animal (cat) (dog) hair and dander: Secondary | ICD-10-CM | POA: Diagnosis not present

## 2017-07-21 DIAGNOSIS — J3081 Allergic rhinitis due to animal (cat) (dog) hair and dander: Secondary | ICD-10-CM | POA: Diagnosis not present

## 2017-07-21 DIAGNOSIS — J3089 Other allergic rhinitis: Secondary | ICD-10-CM | POA: Diagnosis not present

## 2017-07-21 DIAGNOSIS — J301 Allergic rhinitis due to pollen: Secondary | ICD-10-CM | POA: Diagnosis not present

## 2017-07-26 DIAGNOSIS — J3089 Other allergic rhinitis: Secondary | ICD-10-CM | POA: Diagnosis not present

## 2017-07-26 DIAGNOSIS — J3081 Allergic rhinitis due to animal (cat) (dog) hair and dander: Secondary | ICD-10-CM | POA: Diagnosis not present

## 2017-07-26 DIAGNOSIS — J301 Allergic rhinitis due to pollen: Secondary | ICD-10-CM | POA: Diagnosis not present

## 2017-08-02 DIAGNOSIS — J301 Allergic rhinitis due to pollen: Secondary | ICD-10-CM | POA: Diagnosis not present

## 2017-08-02 DIAGNOSIS — J3081 Allergic rhinitis due to animal (cat) (dog) hair and dander: Secondary | ICD-10-CM | POA: Diagnosis not present

## 2017-08-02 DIAGNOSIS — J3089 Other allergic rhinitis: Secondary | ICD-10-CM | POA: Diagnosis not present

## 2017-08-08 DIAGNOSIS — J301 Allergic rhinitis due to pollen: Secondary | ICD-10-CM | POA: Diagnosis not present

## 2017-08-08 DIAGNOSIS — J3081 Allergic rhinitis due to animal (cat) (dog) hair and dander: Secondary | ICD-10-CM | POA: Diagnosis not present

## 2017-08-08 DIAGNOSIS — J3089 Other allergic rhinitis: Secondary | ICD-10-CM | POA: Diagnosis not present

## 2017-08-17 DIAGNOSIS — J3081 Allergic rhinitis due to animal (cat) (dog) hair and dander: Secondary | ICD-10-CM | POA: Diagnosis not present

## 2017-08-17 DIAGNOSIS — J301 Allergic rhinitis due to pollen: Secondary | ICD-10-CM | POA: Diagnosis not present

## 2017-08-17 DIAGNOSIS — J3089 Other allergic rhinitis: Secondary | ICD-10-CM | POA: Diagnosis not present

## 2017-08-23 DIAGNOSIS — J3081 Allergic rhinitis due to animal (cat) (dog) hair and dander: Secondary | ICD-10-CM | POA: Diagnosis not present

## 2017-08-23 DIAGNOSIS — J3089 Other allergic rhinitis: Secondary | ICD-10-CM | POA: Diagnosis not present

## 2017-08-23 DIAGNOSIS — J301 Allergic rhinitis due to pollen: Secondary | ICD-10-CM | POA: Diagnosis not present

## 2017-08-29 DIAGNOSIS — J301 Allergic rhinitis due to pollen: Secondary | ICD-10-CM | POA: Diagnosis not present

## 2017-08-29 DIAGNOSIS — J3089 Other allergic rhinitis: Secondary | ICD-10-CM | POA: Diagnosis not present

## 2017-08-29 DIAGNOSIS — J3081 Allergic rhinitis due to animal (cat) (dog) hair and dander: Secondary | ICD-10-CM | POA: Diagnosis not present

## 2017-09-06 DIAGNOSIS — J3089 Other allergic rhinitis: Secondary | ICD-10-CM | POA: Diagnosis not present

## 2017-09-06 DIAGNOSIS — J301 Allergic rhinitis due to pollen: Secondary | ICD-10-CM | POA: Diagnosis not present

## 2017-09-06 DIAGNOSIS — J3081 Allergic rhinitis due to animal (cat) (dog) hair and dander: Secondary | ICD-10-CM | POA: Diagnosis not present

## 2017-09-13 DIAGNOSIS — J3089 Other allergic rhinitis: Secondary | ICD-10-CM | POA: Diagnosis not present

## 2017-09-13 DIAGNOSIS — J301 Allergic rhinitis due to pollen: Secondary | ICD-10-CM | POA: Diagnosis not present

## 2017-09-13 DIAGNOSIS — J3081 Allergic rhinitis due to animal (cat) (dog) hair and dander: Secondary | ICD-10-CM | POA: Diagnosis not present

## 2017-09-22 DIAGNOSIS — J3089 Other allergic rhinitis: Secondary | ICD-10-CM | POA: Diagnosis not present

## 2017-09-22 DIAGNOSIS — J3081 Allergic rhinitis due to animal (cat) (dog) hair and dander: Secondary | ICD-10-CM | POA: Diagnosis not present

## 2017-09-22 DIAGNOSIS — J301 Allergic rhinitis due to pollen: Secondary | ICD-10-CM | POA: Diagnosis not present

## 2017-09-23 DIAGNOSIS — J3081 Allergic rhinitis due to animal (cat) (dog) hair and dander: Secondary | ICD-10-CM | POA: Diagnosis not present

## 2017-09-23 DIAGNOSIS — J301 Allergic rhinitis due to pollen: Secondary | ICD-10-CM | POA: Diagnosis not present

## 2017-09-26 DIAGNOSIS — J3089 Other allergic rhinitis: Secondary | ICD-10-CM | POA: Diagnosis not present

## 2017-09-28 DIAGNOSIS — J301 Allergic rhinitis due to pollen: Secondary | ICD-10-CM | POA: Diagnosis not present

## 2017-09-28 DIAGNOSIS — J3089 Other allergic rhinitis: Secondary | ICD-10-CM | POA: Diagnosis not present

## 2017-09-28 DIAGNOSIS — J3081 Allergic rhinitis due to animal (cat) (dog) hair and dander: Secondary | ICD-10-CM | POA: Diagnosis not present

## 2017-10-04 DIAGNOSIS — J301 Allergic rhinitis due to pollen: Secondary | ICD-10-CM | POA: Diagnosis not present

## 2017-10-04 DIAGNOSIS — J3089 Other allergic rhinitis: Secondary | ICD-10-CM | POA: Diagnosis not present

## 2017-10-04 DIAGNOSIS — J3081 Allergic rhinitis due to animal (cat) (dog) hair and dander: Secondary | ICD-10-CM | POA: Diagnosis not present

## 2017-10-10 DIAGNOSIS — J3089 Other allergic rhinitis: Secondary | ICD-10-CM | POA: Diagnosis not present

## 2017-10-10 DIAGNOSIS — J301 Allergic rhinitis due to pollen: Secondary | ICD-10-CM | POA: Diagnosis not present

## 2017-10-10 DIAGNOSIS — J3081 Allergic rhinitis due to animal (cat) (dog) hair and dander: Secondary | ICD-10-CM | POA: Diagnosis not present

## 2017-10-25 DIAGNOSIS — J3081 Allergic rhinitis due to animal (cat) (dog) hair and dander: Secondary | ICD-10-CM | POA: Diagnosis not present

## 2017-10-25 DIAGNOSIS — J301 Allergic rhinitis due to pollen: Secondary | ICD-10-CM | POA: Diagnosis not present

## 2017-10-25 DIAGNOSIS — J3089 Other allergic rhinitis: Secondary | ICD-10-CM | POA: Diagnosis not present

## 2017-10-28 DIAGNOSIS — J301 Allergic rhinitis due to pollen: Secondary | ICD-10-CM | POA: Diagnosis not present

## 2017-10-28 DIAGNOSIS — J3089 Other allergic rhinitis: Secondary | ICD-10-CM | POA: Diagnosis not present

## 2017-10-28 DIAGNOSIS — J3081 Allergic rhinitis due to animal (cat) (dog) hair and dander: Secondary | ICD-10-CM | POA: Diagnosis not present

## 2017-11-03 DIAGNOSIS — J301 Allergic rhinitis due to pollen: Secondary | ICD-10-CM | POA: Diagnosis not present

## 2017-11-03 DIAGNOSIS — J3081 Allergic rhinitis due to animal (cat) (dog) hair and dander: Secondary | ICD-10-CM | POA: Diagnosis not present

## 2017-11-03 DIAGNOSIS — J3089 Other allergic rhinitis: Secondary | ICD-10-CM | POA: Diagnosis not present

## 2017-11-07 DIAGNOSIS — J3081 Allergic rhinitis due to animal (cat) (dog) hair and dander: Secondary | ICD-10-CM | POA: Diagnosis not present

## 2017-11-07 DIAGNOSIS — J301 Allergic rhinitis due to pollen: Secondary | ICD-10-CM | POA: Diagnosis not present

## 2017-11-07 DIAGNOSIS — J3089 Other allergic rhinitis: Secondary | ICD-10-CM | POA: Diagnosis not present

## 2017-11-09 DIAGNOSIS — J3089 Other allergic rhinitis: Secondary | ICD-10-CM | POA: Diagnosis not present

## 2017-11-09 DIAGNOSIS — J3081 Allergic rhinitis due to animal (cat) (dog) hair and dander: Secondary | ICD-10-CM | POA: Diagnosis not present

## 2017-11-09 DIAGNOSIS — J301 Allergic rhinitis due to pollen: Secondary | ICD-10-CM | POA: Diagnosis not present

## 2017-11-16 DIAGNOSIS — J3089 Other allergic rhinitis: Secondary | ICD-10-CM | POA: Diagnosis not present

## 2017-11-16 DIAGNOSIS — J301 Allergic rhinitis due to pollen: Secondary | ICD-10-CM | POA: Diagnosis not present

## 2017-11-16 DIAGNOSIS — J3081 Allergic rhinitis due to animal (cat) (dog) hair and dander: Secondary | ICD-10-CM | POA: Diagnosis not present

## 2017-11-22 DIAGNOSIS — J301 Allergic rhinitis due to pollen: Secondary | ICD-10-CM | POA: Diagnosis not present

## 2017-11-22 DIAGNOSIS — J3089 Other allergic rhinitis: Secondary | ICD-10-CM | POA: Diagnosis not present

## 2017-11-22 DIAGNOSIS — J3081 Allergic rhinitis due to animal (cat) (dog) hair and dander: Secondary | ICD-10-CM | POA: Diagnosis not present

## 2017-12-01 DIAGNOSIS — J3081 Allergic rhinitis due to animal (cat) (dog) hair and dander: Secondary | ICD-10-CM | POA: Diagnosis not present

## 2017-12-01 DIAGNOSIS — J3089 Other allergic rhinitis: Secondary | ICD-10-CM | POA: Diagnosis not present

## 2017-12-01 DIAGNOSIS — J301 Allergic rhinitis due to pollen: Secondary | ICD-10-CM | POA: Diagnosis not present

## 2017-12-06 DIAGNOSIS — J3089 Other allergic rhinitis: Secondary | ICD-10-CM | POA: Diagnosis not present

## 2017-12-06 DIAGNOSIS — J301 Allergic rhinitis due to pollen: Secondary | ICD-10-CM | POA: Diagnosis not present

## 2017-12-06 DIAGNOSIS — J3081 Allergic rhinitis due to animal (cat) (dog) hair and dander: Secondary | ICD-10-CM | POA: Diagnosis not present

## 2017-12-12 DIAGNOSIS — J3089 Other allergic rhinitis: Secondary | ICD-10-CM | POA: Diagnosis not present

## 2017-12-12 DIAGNOSIS — J301 Allergic rhinitis due to pollen: Secondary | ICD-10-CM | POA: Diagnosis not present

## 2017-12-12 DIAGNOSIS — J3081 Allergic rhinitis due to animal (cat) (dog) hair and dander: Secondary | ICD-10-CM | POA: Diagnosis not present

## 2017-12-19 DIAGNOSIS — J3081 Allergic rhinitis due to animal (cat) (dog) hair and dander: Secondary | ICD-10-CM | POA: Diagnosis not present

## 2017-12-19 DIAGNOSIS — J301 Allergic rhinitis due to pollen: Secondary | ICD-10-CM | POA: Diagnosis not present

## 2017-12-19 DIAGNOSIS — J3089 Other allergic rhinitis: Secondary | ICD-10-CM | POA: Diagnosis not present

## 2017-12-27 DIAGNOSIS — J3081 Allergic rhinitis due to animal (cat) (dog) hair and dander: Secondary | ICD-10-CM | POA: Diagnosis not present

## 2017-12-27 DIAGNOSIS — J301 Allergic rhinitis due to pollen: Secondary | ICD-10-CM | POA: Diagnosis not present

## 2017-12-27 DIAGNOSIS — J3089 Other allergic rhinitis: Secondary | ICD-10-CM | POA: Diagnosis not present

## 2018-01-04 DIAGNOSIS — J3089 Other allergic rhinitis: Secondary | ICD-10-CM | POA: Diagnosis not present

## 2018-01-04 DIAGNOSIS — J301 Allergic rhinitis due to pollen: Secondary | ICD-10-CM | POA: Diagnosis not present

## 2018-01-04 DIAGNOSIS — J3081 Allergic rhinitis due to animal (cat) (dog) hair and dander: Secondary | ICD-10-CM | POA: Diagnosis not present

## 2018-01-11 DIAGNOSIS — J301 Allergic rhinitis due to pollen: Secondary | ICD-10-CM | POA: Diagnosis not present

## 2018-01-11 DIAGNOSIS — J3089 Other allergic rhinitis: Secondary | ICD-10-CM | POA: Diagnosis not present

## 2018-01-11 DIAGNOSIS — J3081 Allergic rhinitis due to animal (cat) (dog) hair and dander: Secondary | ICD-10-CM | POA: Diagnosis not present

## 2018-01-20 DIAGNOSIS — J3089 Other allergic rhinitis: Secondary | ICD-10-CM | POA: Diagnosis not present

## 2018-01-20 DIAGNOSIS — J3081 Allergic rhinitis due to animal (cat) (dog) hair and dander: Secondary | ICD-10-CM | POA: Diagnosis not present

## 2018-01-20 DIAGNOSIS — J301 Allergic rhinitis due to pollen: Secondary | ICD-10-CM | POA: Diagnosis not present

## 2018-02-01 DIAGNOSIS — J301 Allergic rhinitis due to pollen: Secondary | ICD-10-CM | POA: Diagnosis not present

## 2018-02-01 DIAGNOSIS — J3081 Allergic rhinitis due to animal (cat) (dog) hair and dander: Secondary | ICD-10-CM | POA: Diagnosis not present

## 2018-02-01 DIAGNOSIS — J3089 Other allergic rhinitis: Secondary | ICD-10-CM | POA: Diagnosis not present

## 2018-02-17 DIAGNOSIS — J3089 Other allergic rhinitis: Secondary | ICD-10-CM | POA: Diagnosis not present

## 2018-02-17 DIAGNOSIS — J301 Allergic rhinitis due to pollen: Secondary | ICD-10-CM | POA: Diagnosis not present

## 2018-02-17 DIAGNOSIS — J3081 Allergic rhinitis due to animal (cat) (dog) hair and dander: Secondary | ICD-10-CM | POA: Diagnosis not present

## 2018-02-28 DIAGNOSIS — J301 Allergic rhinitis due to pollen: Secondary | ICD-10-CM | POA: Diagnosis not present

## 2018-02-28 DIAGNOSIS — J3081 Allergic rhinitis due to animal (cat) (dog) hair and dander: Secondary | ICD-10-CM | POA: Diagnosis not present

## 2018-02-28 DIAGNOSIS — J3089 Other allergic rhinitis: Secondary | ICD-10-CM | POA: Diagnosis not present

## 2018-03-06 DIAGNOSIS — J301 Allergic rhinitis due to pollen: Secondary | ICD-10-CM | POA: Diagnosis not present

## 2018-03-06 DIAGNOSIS — J3089 Other allergic rhinitis: Secondary | ICD-10-CM | POA: Diagnosis not present

## 2018-03-06 DIAGNOSIS — J3081 Allergic rhinitis due to animal (cat) (dog) hair and dander: Secondary | ICD-10-CM | POA: Diagnosis not present

## 2018-03-24 DIAGNOSIS — J301 Allergic rhinitis due to pollen: Secondary | ICD-10-CM | POA: Diagnosis not present

## 2018-03-24 DIAGNOSIS — J3089 Other allergic rhinitis: Secondary | ICD-10-CM | POA: Diagnosis not present

## 2018-03-24 DIAGNOSIS — J3081 Allergic rhinitis due to animal (cat) (dog) hair and dander: Secondary | ICD-10-CM | POA: Diagnosis not present

## 2018-03-27 DIAGNOSIS — J3089 Other allergic rhinitis: Secondary | ICD-10-CM | POA: Diagnosis not present

## 2018-03-27 DIAGNOSIS — J3081 Allergic rhinitis due to animal (cat) (dog) hair and dander: Secondary | ICD-10-CM | POA: Diagnosis not present

## 2018-03-27 DIAGNOSIS — J301 Allergic rhinitis due to pollen: Secondary | ICD-10-CM | POA: Diagnosis not present

## 2018-04-05 DIAGNOSIS — J3081 Allergic rhinitis due to animal (cat) (dog) hair and dander: Secondary | ICD-10-CM | POA: Diagnosis not present

## 2018-04-05 DIAGNOSIS — J3089 Other allergic rhinitis: Secondary | ICD-10-CM | POA: Diagnosis not present

## 2018-04-05 DIAGNOSIS — J301 Allergic rhinitis due to pollen: Secondary | ICD-10-CM | POA: Diagnosis not present

## 2018-04-06 DIAGNOSIS — J3089 Other allergic rhinitis: Secondary | ICD-10-CM | POA: Diagnosis not present

## 2018-04-11 DIAGNOSIS — J3089 Other allergic rhinitis: Secondary | ICD-10-CM | POA: Diagnosis not present

## 2018-04-11 DIAGNOSIS — J3081 Allergic rhinitis due to animal (cat) (dog) hair and dander: Secondary | ICD-10-CM | POA: Diagnosis not present

## 2018-04-11 DIAGNOSIS — J301 Allergic rhinitis due to pollen: Secondary | ICD-10-CM | POA: Diagnosis not present

## 2018-04-21 DIAGNOSIS — J3089 Other allergic rhinitis: Secondary | ICD-10-CM | POA: Diagnosis not present

## 2018-04-21 DIAGNOSIS — J301 Allergic rhinitis due to pollen: Secondary | ICD-10-CM | POA: Diagnosis not present

## 2018-04-21 DIAGNOSIS — J3081 Allergic rhinitis due to animal (cat) (dog) hair and dander: Secondary | ICD-10-CM | POA: Diagnosis not present

## 2018-04-27 DIAGNOSIS — J3089 Other allergic rhinitis: Secondary | ICD-10-CM | POA: Diagnosis not present

## 2018-04-27 DIAGNOSIS — J301 Allergic rhinitis due to pollen: Secondary | ICD-10-CM | POA: Diagnosis not present

## 2018-04-27 DIAGNOSIS — J3081 Allergic rhinitis due to animal (cat) (dog) hair and dander: Secondary | ICD-10-CM | POA: Diagnosis not present

## 2018-05-08 DIAGNOSIS — J3081 Allergic rhinitis due to animal (cat) (dog) hair and dander: Secondary | ICD-10-CM | POA: Diagnosis not present

## 2018-05-08 DIAGNOSIS — J3089 Other allergic rhinitis: Secondary | ICD-10-CM | POA: Diagnosis not present

## 2018-05-08 DIAGNOSIS — J301 Allergic rhinitis due to pollen: Secondary | ICD-10-CM | POA: Diagnosis not present

## 2018-05-11 DIAGNOSIS — J301 Allergic rhinitis due to pollen: Secondary | ICD-10-CM | POA: Diagnosis not present

## 2018-05-11 DIAGNOSIS — J3089 Other allergic rhinitis: Secondary | ICD-10-CM | POA: Diagnosis not present

## 2018-05-11 DIAGNOSIS — J3081 Allergic rhinitis due to animal (cat) (dog) hair and dander: Secondary | ICD-10-CM | POA: Diagnosis not present

## 2018-05-17 DIAGNOSIS — J301 Allergic rhinitis due to pollen: Secondary | ICD-10-CM | POA: Diagnosis not present

## 2018-05-17 DIAGNOSIS — J3081 Allergic rhinitis due to animal (cat) (dog) hair and dander: Secondary | ICD-10-CM | POA: Diagnosis not present

## 2018-05-17 DIAGNOSIS — J3089 Other allergic rhinitis: Secondary | ICD-10-CM | POA: Diagnosis not present

## 2018-05-22 DIAGNOSIS — J3081 Allergic rhinitis due to animal (cat) (dog) hair and dander: Secondary | ICD-10-CM | POA: Diagnosis not present

## 2018-05-22 DIAGNOSIS — J3089 Other allergic rhinitis: Secondary | ICD-10-CM | POA: Diagnosis not present

## 2018-05-22 DIAGNOSIS — J301 Allergic rhinitis due to pollen: Secondary | ICD-10-CM | POA: Diagnosis not present

## 2018-05-25 DIAGNOSIS — J3081 Allergic rhinitis due to animal (cat) (dog) hair and dander: Secondary | ICD-10-CM | POA: Diagnosis not present

## 2018-05-25 DIAGNOSIS — J3089 Other allergic rhinitis: Secondary | ICD-10-CM | POA: Diagnosis not present

## 2018-05-25 DIAGNOSIS — J301 Allergic rhinitis due to pollen: Secondary | ICD-10-CM | POA: Diagnosis not present

## 2018-06-08 DIAGNOSIS — J3081 Allergic rhinitis due to animal (cat) (dog) hair and dander: Secondary | ICD-10-CM | POA: Diagnosis not present

## 2018-06-08 DIAGNOSIS — J301 Allergic rhinitis due to pollen: Secondary | ICD-10-CM | POA: Diagnosis not present

## 2018-06-08 DIAGNOSIS — J3089 Other allergic rhinitis: Secondary | ICD-10-CM | POA: Diagnosis not present

## 2018-06-21 DIAGNOSIS — J3081 Allergic rhinitis due to animal (cat) (dog) hair and dander: Secondary | ICD-10-CM | POA: Diagnosis not present

## 2018-06-21 DIAGNOSIS — J301 Allergic rhinitis due to pollen: Secondary | ICD-10-CM | POA: Diagnosis not present

## 2018-06-21 DIAGNOSIS — J3089 Other allergic rhinitis: Secondary | ICD-10-CM | POA: Diagnosis not present

## 2018-06-26 DIAGNOSIS — J3081 Allergic rhinitis due to animal (cat) (dog) hair and dander: Secondary | ICD-10-CM | POA: Diagnosis not present

## 2018-06-26 DIAGNOSIS — J3089 Other allergic rhinitis: Secondary | ICD-10-CM | POA: Diagnosis not present

## 2018-06-26 DIAGNOSIS — J301 Allergic rhinitis due to pollen: Secondary | ICD-10-CM | POA: Diagnosis not present

## 2018-07-06 DIAGNOSIS — J301 Allergic rhinitis due to pollen: Secondary | ICD-10-CM | POA: Diagnosis not present

## 2018-07-06 DIAGNOSIS — J3081 Allergic rhinitis due to animal (cat) (dog) hair and dander: Secondary | ICD-10-CM | POA: Diagnosis not present

## 2018-07-06 DIAGNOSIS — J3089 Other allergic rhinitis: Secondary | ICD-10-CM | POA: Diagnosis not present

## 2019-05-08 ENCOUNTER — Encounter: Payer: Self-pay | Admitting: Family Medicine

## 2019-05-15 ENCOUNTER — Emergency Department (HOSPITAL_COMMUNITY)
Admission: EM | Admit: 2019-05-15 | Discharge: 2019-05-15 | Disposition: A | Discharge status: Home / Self Care | Payer: 59 | Attending: Emergency Medicine | Admitting: Emergency Medicine

## 2019-05-15 ENCOUNTER — Encounter (HOSPITAL_COMMUNITY): Payer: Self-pay | Admitting: Emergency Medicine

## 2019-05-15 ENCOUNTER — Emergency Department (HOSPITAL_COMMUNITY): Payer: 59

## 2019-05-15 ENCOUNTER — Other Ambulatory Visit: Payer: Self-pay

## 2019-05-15 DIAGNOSIS — R1032 Left lower quadrant pain: Secondary | ICD-10-CM | POA: Diagnosis present

## 2019-05-15 DIAGNOSIS — R109 Unspecified abdominal pain: Secondary | ICD-10-CM

## 2019-05-15 DIAGNOSIS — R319 Hematuria, unspecified: Secondary | ICD-10-CM | POA: Diagnosis not present

## 2019-05-15 LAB — URINALYSIS, ROUTINE W REFLEX MICROSCOPIC
Bilirubin Urine: NEGATIVE
Glucose, UA: NEGATIVE mg/dL
Ketones, ur: NEGATIVE mg/dL
Leukocytes,Ua: NEGATIVE
Nitrite: NEGATIVE
Protein, ur: 30 mg/dL — AB
Specific Gravity, Urine: 1.018 (ref 1.005–1.030)
pH: 8 (ref 5.0–8.0)

## 2019-05-15 LAB — CBC
HCT: 48.8 % (ref 39.0–52.0)
Hemoglobin: 16 g/dL (ref 13.0–17.0)
MCH: 29.5 pg (ref 26.0–34.0)
MCHC: 32.8 g/dL (ref 30.0–36.0)
MCV: 90 fL (ref 80.0–100.0)
Platelets: 348 10*3/uL (ref 150–400)
RBC: 5.42 MIL/uL (ref 4.22–5.81)
RDW: 12.6 % (ref 11.5–15.5)
WBC: 11 10*3/uL — ABNORMAL HIGH (ref 4.0–10.5)
nRBC: 0 % (ref 0.0–0.2)

## 2019-05-15 LAB — BASIC METABOLIC PANEL
Anion gap: 10 (ref 5–15)
BUN: 12 mg/dL (ref 6–20)
CO2: 26 mmol/L (ref 22–32)
Calcium: 9.2 mg/dL (ref 8.9–10.3)
Chloride: 102 mmol/L (ref 98–111)
Creatinine, Ser: 1.27 mg/dL — ABNORMAL HIGH (ref 0.61–1.24)
GFR calc Af Amer: >60 mL/min (ref >60)
GFR calc non Af Amer: >60 mL/min (ref >60)
Glucose, Bld: 131 mg/dL — ABNORMAL HIGH (ref 70–99)
Potassium: 4.3 mmol/L (ref 3.5–5.1)
Sodium: 138 mmol/L (ref 135–145)

## 2019-05-15 NOTE — Discharge Instructions (Addendum)
Your symptoms appear to be consistent with a passed kidney stone. You do have blood in your urine and this should be followed up until clear.  You have been given a referral to urology. Return to the emergency department if you are having worsening pain or further symptoms.

## 2019-05-15 NOTE — ED Notes (Signed)
Pt d/c home per MD order. Discharge summary reviewed with pt, pt verbalizes understanding. Off unit via WC. Reports discharge ride home.  

## 2019-05-15 NOTE — ED Provider Notes (Signed)
[facility] EMERGENCY DEPARTMENT Provider Note   CSN: [id number] Arrival date & time: 05/15/19  3220     History Chief Complaint  Patient presents with  . Flank Pain    Nathan Rowland is a 39 y.o. male.  HPI 39 year old male presents today complaining of left flank pain.  He states that he awoke this morning around 530 and went to the bathroom to urinate.  When he began urinating he started having severe pain in the left flank area that radiated around to the left lower quadrant and into the groin and upper thigh.  It was severe in nature.  It lasted approximately an hour and a half and resolved on arrival here to the emergency department.  He had not noted any dark or bloody urine.  He has not had similar symptoms in the past.  He denies any fever, chills, cough, chest pain, diarrhea.  He is currently asymptomatic.    Past Medical History:  Diagnosis Date  . Allergy 11/1996  . Finger fracture   . Left knee pain    dx'd with L retropatellar knee pain    Patient Active Problem List   Diagnosis Date Noted  . Administrative encounter 05/31/2012  . Injury of right toe 12/17/2010  . ALLERGIC RHINITIS 05/24/2008  . 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 No pertinent surgical history.     Family History  Problem Relation Age of Onset  . Diabetes Other        3 great aunts, adult onset  . Cancer Other        Uterine  . Heart disease Maternal Grandmother        MI  . Stroke Maternal Grandmother   . Cancer Maternal Grandfather        Lung  . Cancer Paternal Grandmother        Skin  . Emphysema Paternal Grandfather   . Hypertension Neg Hx   . Depression Neg Hx   . Alcohol abuse Neg Hx   . Drug abuse Neg Hx   . Prostate cancer Neg Hx   . Colon cancer Neg Hx     Social History   Tobacco Use  . Smoking status: Never Smoker  . Smokeless tobacco: Never Used  Substance Use Topics  . Alcohol use: Yes    Comment:  occ  . Drug use: No    Home Medications Prior to Admission medications   Not on File    Allergies    Patient has no known allergies.  Review of Systems   Review of Systems  All other systems reviewed and are negative.   Physical Exam Updated Vital Signs BP 126/81   Pulse 83   Temp (!) 97.4 F (36.3 C) (Oral)   Resp 16   SpO2 96%   Physical Exam Vitals and nursing note reviewed.  Constitutional:      Appearance: He is well-developed.  HENT:     Head: Normocephalic and atraumatic.     Right Ear: External ear normal.     Left Ear: External ear normal.     Nose: Nose normal.  Eyes:     Conjunctiva/sclera: Conjunctivae normal.     Pupils: Pupils are equal, round, and reactive to light.  Cardiovascular:     Rate and Rhythm: Normal rate and regular rhythm.     Heart sounds: Normal heart sounds.  Pulmonary:     Effort: Pulmonary effort is normal. No respiratory distress.  Breath sounds: Normal breath sounds. No wheezing.  Chest:     Chest wall: No tenderness.  Abdominal:     General: Bowel sounds are normal. There is no distension.     Palpations: Abdomen is soft. There is no mass.     Tenderness: There is no abdominal tenderness. There is no guarding.  Musculoskeletal:        General: Normal range of motion.     Cervical back: Normal range of motion and neck supple.  Skin:    General: Skin is warm and dry.  Neurological:     Mental Status: He is alert and oriented to person, place, and time.     Motor: No abnormal muscle tone.     Coordination: Coordination normal.     Deep Tendon Reflexes: Reflexes are normal and symmetric.  Psychiatric:        Behavior: Behavior normal.        Thought Content: Thought content normal.        Judgment: Judgment normal.     ED Results / Procedures / Treatments   Labs (all labs ordered are listed, but only abnormal results are displayed) Labs Reviewed  BASIC METABOLIC PANEL - Abnormal; Notable for the following  components:      Result Value   Glucose, Bld 131 (*)    Creatinine, Ser 1.27 (*)    All other components within normal limits  CBC - Abnormal; Notable for the following components:   WBC 11.0 (*)    All other components within normal limits  URINALYSIS, ROUTINE W REFLEX MICROSCOPIC    EKG None  Radiology CT Renal Stone Study  Result Date: 05/15/2019 CLINICAL DATA:  Left flank pain, dysuria EXAM: CT ABDOMEN AND PELVIS WITHOUT CONTRAST TECHNIQUE: Multidetector CT imaging of the abdomen and pelvis was performed following the standard protocol without IV contrast. COMPARISON:  None. FINDINGS: Lower chest: No acute abnormality. Hepatobiliary: No focal hepatic abnormality. Gallbladder unremarkable. Pancreas: No focal abnormality or ductal dilatation. Spleen: No focal abnormality.  Normal size. Adrenals/Urinary Tract: Small nonobstructing stones in the midpole of the left kidney and lower pole of the right kidney. No ureteral stones or hydronephrosis. No adrenal mass. Urinary bladder unremarkable. Stomach/Bowel: Moderate stool burden. Normal appendix. Stomach, large and small bowel grossly unremarkable. Vascular/Lymphatic: No evidence of aneurysm or adenopathy. Reproductive: No visible focal abnormality. Other: No free fluid or free air. Small bilateral inguinal hernias containing fat. Musculoskeletal: No acute bony abnormality. IMPRESSION: Small punctate bilateral nephrolithiasis. No ureteral stones or hydronephrosis. No acute findings in the abdomen or pelvis. Electronically Signed   By: Rolm Baptise M.D.   On: 05/15/2019 08:58    Procedures Procedures (including critical care time)  Medications Ordered in ED Medications - No data to display  ED Course  I have reviewed the triage vital signs and the nursing notes.  Pertinent labs & imaging results that were available during my care of the patient were reviewed by me and considered in my medical decision making (see chart for details).      MDM Rules/Calculators/A&P                      Patient presents today with right-sided flank pain consistent with renal colic.  His pain had resolved prior to my evaluation.  CT scan shows punctate stones but no ureteral obstruction.  I suspect that, given his pain-free status, he has passed the stone.  He has urine that has 21-50 red blood cells  consistent with this.  Patient is pain-free here.  Plan referral to urology for follow-up as needed.  Discussed kidney stones and given information. Final Clinical Impression(s) / ED Diagnoses Final diagnoses:  Flank pain  Hematuria, unspecified type    Rx / DC Orders ED Discharge Orders    None       Margarita Grizzle, MD 05/15/19 [phone]

## 2019-05-15 NOTE — ED Triage Notes (Signed)
Patient reports L flank pain that began at 0530 this morning. He states that pain comes in waves and was radiating to his testicles earlier. He reports the pain has eased off at this time. Denies hematuria but does endorse dysuria despite feeling an extreme urge to go.

## 2020-02-27 ENCOUNTER — Other Ambulatory Visit: Payer: Self-pay

## 2020-02-27 ENCOUNTER — Telehealth: Payer: Self-pay | Admitting: Internal Medicine
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
## 2020-06-12 DIAGNOSIS — J301 Allergic rhinitis due to pollen: Secondary | ICD-10-CM | POA: Diagnosis not present

## 2020-06-12 DIAGNOSIS — J3089 Other allergic rhinitis: Secondary | ICD-10-CM | POA: Diagnosis not present

## 2020-06-12 DIAGNOSIS — J3081 Allergic rhinitis due to animal (cat) (dog) hair and dander: Secondary | ICD-10-CM | POA: Diagnosis not present

## 2020-06-27 DIAGNOSIS — J3081 Allergic rhinitis due to animal (cat) (dog) hair and dander: Secondary | ICD-10-CM | POA: Diagnosis not present

## 2020-06-27 DIAGNOSIS — J301 Allergic rhinitis due to pollen: Secondary | ICD-10-CM | POA: Diagnosis not present

## 2020-06-27 DIAGNOSIS — J3089 Other allergic rhinitis: Secondary | ICD-10-CM | POA: Diagnosis not present

## 2020-07-10 DIAGNOSIS — J3081 Allergic rhinitis due to animal (cat) (dog) hair and dander: Secondary | ICD-10-CM | POA: Diagnosis not present

## 2020-07-10 DIAGNOSIS — J301 Allergic rhinitis due to pollen: Secondary | ICD-10-CM | POA: Diagnosis not present

## 2020-07-10 DIAGNOSIS — J3089 Other allergic rhinitis: Secondary | ICD-10-CM | POA: Diagnosis not present

## 2020-07-24 DIAGNOSIS — J301 Allergic rhinitis due to pollen: Secondary | ICD-10-CM | POA: Diagnosis not present

## 2020-07-24 DIAGNOSIS — J3089 Other allergic rhinitis: Secondary | ICD-10-CM | POA: Diagnosis not present

## 2020-07-24 DIAGNOSIS — J3081 Allergic rhinitis due to animal (cat) (dog) hair and dander: Secondary | ICD-10-CM | POA: Diagnosis not present

## 2020-07-29 DIAGNOSIS — J3081 Allergic rhinitis due to animal (cat) (dog) hair and dander: Secondary | ICD-10-CM | POA: Diagnosis not present

## 2020-07-29 DIAGNOSIS — J301 Allergic rhinitis due to pollen: Secondary | ICD-10-CM | POA: Diagnosis not present

## 2021-02-20 ENCOUNTER — Emergency Department (HOSPITAL_COMMUNITY): Payer: No Typology Code available for payment source

## 2021-02-20 ENCOUNTER — Other Ambulatory Visit: Payer: Self-pay

## 2021-02-20 ENCOUNTER — Emergency Department (HOSPITAL_COMMUNITY)
Admission: EM | Admit: 2021-02-20 | Discharge: 2021-02-20 | Disposition: A | Discharge status: Home / Self Care | Payer: No Typology Code available for payment source | Attending: Emergency Medicine | Admitting: Emergency Medicine

## 2021-02-20 DIAGNOSIS — R109 Unspecified abdominal pain: Secondary | ICD-10-CM | POA: Insufficient documentation

## 2021-02-20 DIAGNOSIS — S0511XA Contusion of eyeball and orbital tissues, right eye, initial encounter: Secondary | ICD-10-CM | POA: Insufficient documentation

## 2021-02-20 DIAGNOSIS — T07XXXA Unspecified multiple injuries, initial encounter: Secondary | ICD-10-CM

## 2021-02-20 DIAGNOSIS — I81 Portal vein thrombosis: Secondary | ICD-10-CM

## 2021-02-20 DIAGNOSIS — S2242XA Multiple fractures of ribs, left side, initial encounter for closed fracture: Secondary | ICD-10-CM

## 2021-02-20 DIAGNOSIS — Z23 Encounter for immunization: Secondary | ICD-10-CM | POA: Insufficient documentation

## 2021-02-20 DIAGNOSIS — S069X9A Unspecified intracranial injury with loss of consciousness of unspecified duration, initial encounter: Secondary | ICD-10-CM | POA: Diagnosis present

## 2021-02-20 DIAGNOSIS — Y92410 Unspecified street and highway as the place of occurrence of the external cause: Secondary | ICD-10-CM | POA: Insufficient documentation

## 2021-02-20 DIAGNOSIS — R0789 Other chest pain: Secondary | ICD-10-CM

## 2021-02-20 DIAGNOSIS — S20212A Contusion of left front wall of thorax, initial encounter: Secondary | ICD-10-CM | POA: Diagnosis not present

## 2021-02-20 DIAGNOSIS — T148XXA Other injury of unspecified body region, initial encounter: Secondary | ICD-10-CM

## 2021-02-20 HISTORY — DX: Portal vein thrombosis: I81

## 2021-02-20 HISTORY — DX: Multiple fractures of ribs, left side, initial encounter for closed fracture: S22.42XA

## 2021-02-20 LAB — CBC WITH DIFFERENTIAL/PLATELET
Abs Immature Granulocytes: 0.21 10*3/uL — ABNORMAL HIGH (ref 0.00–0.07)
Basophils Absolute: 0.1 10*3/uL (ref 0.0–0.1)
Basophils Relative: 0 %
Eosinophils Absolute: 0 10*3/uL (ref 0.0–0.5)
Eosinophils Relative: 0 %
HCT: 48.1 % (ref 39.0–52.0)
Hemoglobin: 16.3 g/dL (ref 13.0–17.0)
Immature Granulocytes: 1 %
Lymphocytes Relative: 9 %
Lymphs Abs: 1.9 10*3/uL (ref 0.7–4.0)
MCH: 30.2 pg (ref 26.0–34.0)
MCHC: 33.9 g/dL (ref 30.0–36.0)
MCV: 89.2 fL (ref 80.0–100.0)
Monocytes Absolute: 1.2 10*3/uL — ABNORMAL HIGH (ref 0.1–1.0)
Monocytes Relative: 6 %
Neutro Abs: 17.8 10*3/uL — ABNORMAL HIGH (ref 1.7–7.7)
Neutrophils Relative %: 84 %
Platelets: 324 10*3/uL (ref 150–400)
RBC: 5.39 MIL/uL (ref 4.22–5.81)
RDW: 12.6 % (ref 11.5–15.5)
WBC: 21.2 10*3/uL — ABNORMAL HIGH (ref 4.0–10.5)
nRBC: 0 % (ref 0.0–0.2)

## 2021-02-20 LAB — COMPREHENSIVE METABOLIC PANEL
ALT: 25 U/L (ref 0–44)
AST: 26 U/L (ref 15–41)
Albumin: 4 g/dL (ref 3.5–5.0)
Alkaline Phosphatase: 59 U/L (ref 38–126)
Anion gap: 10 (ref 5–15)
BUN: 10 mg/dL (ref 6–20)
CO2: 23 mmol/L (ref 22–32)
Calcium: 9 mg/dL (ref 8.9–10.3)
Chloride: 105 mmol/L (ref 98–111)
Creatinine, Ser: 1.03 mg/dL (ref 0.61–1.24)
GFR, Estimated: >60 mL/min (ref >60)
Glucose, Bld: 128 mg/dL — ABNORMAL HIGH (ref 70–99)
Potassium: 4.2 mmol/L (ref 3.5–5.1)
Sodium: 138 mmol/L (ref 135–145)
Total Bilirubin: 0.6 mg/dL (ref 0.3–1.2)
Total Protein: 6.9 g/dL (ref 6.5–8.1)

## 2021-02-20 LAB — TROPONIN I (HIGH SENSITIVITY)
Troponin I (High Sensitivity): 9 ng/L (ref <18)
Troponin I (High Sensitivity): 10 ng/L (ref <18)

## 2021-02-20 LAB — LIPASE, BLOOD: Lipase: 33 U/L (ref 11–51)

## 2021-02-20 MED ORDER — FENTANYL CITRATE PF 50 MCG/ML IJ SOSY
50.0000 ug | PREFILLED_SYRINGE | Freq: Once | INTRAMUSCULAR | Status: AC
  Administered 2021-02-20: 11:00:00 50 ug via INTRAVENOUS
  Filled 2021-02-20: qty 1

## 2021-02-20 MED ORDER — LACTATED RINGERS IV BOLUS
1000.0000 mL | Freq: Once | INTRAVENOUS | Status: AC
  Administered 2021-02-20: 11:00:00 1000 mL via INTRAVENOUS

## 2021-02-20 MED ORDER — HYDROMORPHONE HCL 1 MG/ML IJ SOLN
1.0000 mg | Freq: Once | INTRAMUSCULAR | Status: AC
  Administered 2021-02-20: 12:00:00 1 mg via INTRAVENOUS
  Filled 2021-02-20: qty 1

## 2021-02-20 MED ORDER — METHOCARBAMOL 1000 MG PO TABS: 1000.0000 mg | ORAL_TABLET | Freq: Three times a day (TID) | ORAL | 0 refills | Status: DC | PRN

## 2021-02-20 MED ORDER — ACETAMINOPHEN 500 MG PO TABS
1000.0000 mg | ORAL_TABLET | Freq: Four times a day (QID) | ORAL | Status: DC
  Administered 2021-02-20: 15:00:00 1000 mg via ORAL
  Filled 2021-02-20: qty 2

## 2021-02-20 MED ORDER — ACETAMINOPHEN 500 MG PO TABS: 1000.0000 mg | ORAL_TABLET | Freq: Four times a day (QID) | ORAL | 0 refills | Status: DC

## 2021-02-20 MED ORDER — OXYCODONE HCL 5 MG/5ML PO SOLN
5.0000 mg | ORAL | Status: DC | PRN
Start: 2021-02-20 — End: 2021-02-20

## 2021-02-20 MED ORDER — METHOCARBAMOL 500 MG PO TABS
1000.0000 mg | ORAL_TABLET | Freq: Three times a day (TID) | ORAL | Status: DC
  Administered 2021-02-20: 15:00:00 1000 mg via ORAL
  Filled 2021-02-20: qty 2

## 2021-02-20 MED ORDER — KETOROLAC TROMETHAMINE 15 MG/ML IJ SOLN
30.0000 mg | Freq: Four times a day (QID) | INTRAMUSCULAR | Status: DC
  Administered 2021-02-20: 15:00:00 30 mg via INTRAVENOUS
  Filled 2021-02-20: qty 2

## 2021-02-20 MED ORDER — OXYCODONE HCL 5 MG PO TABS: 5.0000 mg | ORAL_TABLET | Freq: Four times a day (QID) | ORAL | 0 refills | Status: DC | PRN

## 2021-02-20 MED ORDER — TETANUS-DIPHTH-ACELL PERTUSSIS 5-2.5-18.5 LF-MCG/0.5 IM SUSY
0.5000 mL | PREFILLED_SYRINGE | Freq: Once | INTRAMUSCULAR | Status: AC
  Administered 2021-02-20: 12:00:00 0.5 mL via INTRAMUSCULAR
  Filled 2021-02-20: qty 0.5

## 2021-02-20 MED ORDER — OXYCODONE HCL 5 MG PO TABS
5.0000 mg | ORAL_TABLET | ORAL | Status: DC | PRN
Start: 2021-02-20 — End: 2021-02-20
  Administered 2021-02-20: 15:00:00 10 mg via ORAL
  Filled 2021-02-20: qty 2

## 2021-02-20 MED ORDER — IOHEXOL 300 MG/ML  SOLN
75.0000 mL | Freq: Once | INTRAMUSCULAR | Status: AC | PRN
  Administered 2021-02-20: 11:00:00 75 mL via INTRAVENOUS
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
## 2021-02-20 NOTE — ED Notes (Signed)
Patient verbalizes understanding of discharge instructions. Prescriptions and follow-up care reviewed. Opportunity for questioning and answers were provided. Armband removed by staff, pt discharged from ED ambualtory.

## 2021-02-20 NOTE — Discharge Instructions (Addendum)
Please do not drive or operate heavy machinery while taking narcotic pain medication such as oxycodone.   PNEUMOTHORAX OR HEMOTHORAX +/- RIB FRACTURES  HOME INSTRUCTIONS   PAIN CONTROL:  Pain is best controlled by a usual combination of three different methods TOGETHER:  Ice/Heat Over the counter pain medication Prescription pain medication You may experience some swelling and bruising in area of broken ribs. Ice packs or heating pads (30-60 minutes up to 6 times a day) will help. Use ice for the first few days to help decrease swelling and bruising, then switch to heat to help relax tight/sore spots and speed recovery. Some people prefer to use ice alone, heat alone, alternating between ice & heat. Experiment to what works for you. Swelling and bruising can take several weeks to resolve.  It is helpful to take an over-the-counter pain medication regularly for the first few weeks. Choose one of the following that works best for you:  Naproxen (Aleve, etc) Two 220mg  tabs twice a day Ibuprofen (Advil, etc) Three 200mg  tabs four times a day (every meal & bedtime) Acetaminophen (Tylenol, etc) 500-650mg  four times a day (every meal & bedtime) A prescription for pain medication (such as oxycodone, hydrocodone, etc) may be given to you upon discharge. Take your pain medication as prescribed.  If you are having problems/concerns with the prescription medicine (does not control pain, nausea, vomiting, rash, itching, etc), please call [phone] to see if we need to switch you to a different pain medicine that will work better for you and/or control your side effect better. If you need a refill on your pain medication, please contact your pharmacy. They will contact our office to request authorization. Prescriptions will not be filled after 5 pm or on week-ends. Avoid getting constipated. When taking pain medications, it is common to experience some constipation. Increasing fluid intake and taking a  fiber supplement (such as Metamucil, Citrucel, FiberCon, MiraLax, etc) 1-2 times a day regularly will usually help prevent this problem from occurring. A mild laxative (prune juice, Milk of Magnesia, MiraLax, etc) should be taken according to package directions if there are no bowel movements after 48 hours.  Watch out for diarrhea. If you have many loose bowel movements, simplify your diet to bland foods & liquids for a few days. Stop any stool softeners and decrease your fiber supplement. Switching to mild anti-diarrheal medications (Kayopectate, Pepto Bismol) can help. If this worsens or does not improve, please call us. Chest tube site wound: you may remove the dressing from your chest tube site 3 days after the removal of your chest tube. DO NOT shower over the dressing. Once   removed, you may shower as normal. Do not submerge your wound in water for 2-3 weeks.  FOLLOW UP  Please call our office to set up or confirm an appointment for follow up for 2 weeks after discharge. You will need to get a chest xray at either [facility] or [facility]. This will be outlined in your follow up instructions. Please call [facility] at [phone] if you have any questions about follow up.  If you have any orthopedic or other injuries you will need to follow up as outlined in your follow up instructions.   WHEN TO CALL [facility] [phone]:  Poor pain control Reactions / problems with new medications (rash/itching, nausea, etc)  Fever over 101.5 F (38.5 C) Worsening swelling or bruising Redness, drainage, pain or swelling around chest tube site Worsening pain, productive cough,  difficulty breathing or any other concerning symptoms  The clinic staff is available to answer your questions during regular business hours (8:30am-5pm). Please dont hesitate to call and ask to speak to one of our nurses for clinical concerns.  If you have a medical emergency, go to the nearest emergency room or call 911.  A  surgeon from [facility] is always on call at the [facility], [place]  [address], [place], [place] [address] ?  MAIN: [phone] ? TOLL FREE: [phone] ?  FAX [phone]  [email or web address]      Information on Rib Fractures  A rib fracture is a break or crack in one of the bones of the ribs. The ribs are long, curved bones that wrap around your chest and attach to your spine and your breastbone. The ribs protect your heart, lungs, and other organs in the chest. A broken or cracked rib is often painful but is not usually serious. Most rib fractures heal on their own over time. However, rib fractures can be more serious if multiple ribs are broken or if broken ribs move out of place and push against other structures or organs. What are the causes? This condition is caused by: Repetitive movements with high force, such as pitching a baseball or having severe coughing spells. A direct blow to the chest, such as a sports injury, a car accident, or a fall. Cancer that has spread to the bones, which can weaken bones and cause them to break. What are the signs or symptoms? Symptoms of this condition include: Pain when you breathe in or cough. Pain when someone presses on the injured area. Feeling short of breath. How is this diagnosed? This condition is diagnosed with a physical exam and medical history. Imaging tests may also be done, such as: Chest X-ray. CT scan. MRI. Bone scan. Chest ultrasound. How is this treated? Treatment for this condition depends on the severity of the fracture. Most rib fractures usually heal on their own in 1-3 months. Sometimes healing takes longer if there is a cough that does not stop or if there are other activities that make the injury worse (aggravating factors). While you heal, you will be given medicines to control the pain. You will also be taught deep breathing  exercises. Severe injuries may require hospitalization or surgery. Follow these instructions at home: Managing pain, stiffness, and swelling If directed, apply ice to the injured area. Put ice in a plastic bag. Place a towel between your skin and the bag. Leave the ice on for 20 minutes, 2-3 times a day. Take over-the-counter and prescription medicines only as told by your health care provider. Activity Avoid a lot of activity and any activities or movements that cause pain. Be careful during activities and avoid bumping the injured rib. Slowly increase your activity as told by your health care provider. General instructions Do deep breathing exercises as told by your health care provider. This helps prevent pneumonia, which is a common complication of a broken rib. Your health care provider may instruct you to: Take deep breaths several times a day. Try to cough several times a day, holding a pillow against the injured area. Use a device called incentive spirometer to practice deep breathing several times a day. Drink enough fluid to keep your urine pale yellow. Do not wear a rib belt or binder. These restrict breathing, which can lead to pneumonia. Keep all follow-up visits as told by  your health care provider. This is important. Contact a health care provider if: You have a fever. Get help right away if: You have difficulty breathing or you are short of breath. You develop a cough that does not stop, or you cough up thick or bloody sputum. You have nausea, vomiting, or pain in your abdomen. Your pain gets worse and medicine does not help. Summary A rib fracture is a break or crack in one of the bones of the ribs. A broken or cracked rib is often painful but is not usually serious. Most rib fractures heal on their own over time. Treatment for this condition depends on the severity of the fracture. Avoid a lot of activity and any activities or movements that cause pain. This  information is not intended to replace advice given to you by your health care provider. Make sure you discuss any questions you have with your health care provider. Document Released: 02/15/2005 Document Revised: 05/17/2016 Document Reviewed: 05/17/2016 Elsevier Interactive Patient Education  2019 [employer]    Pneumothorax A pneumothorax is commonly called a collapsed lung. It is a condition in which air leaks from a lung and builds up between the thin layer of tissue that covers the lungs (visceral pleura) and the interior wall of the chest cavity (parietal pleura). The air gets trapped outside the lung, between the lung and the chest wall (pleural space). The air takes up space and prevents the lung from fully expanding. This condition sometimes occurs suddenly with no apparent cause. The buildup of air may be small or large. A small pneumothorax may go away on its own. A large pneumothorax will require treatment and hospitalization. What are the causes? This condition may be caused by: Trauma and injury to the chest wall. Surgery and other medical procedures. A complication of an underlying lung problem, especially chronic obstructive pulmonary disease (COPD) or emphysema. Sometimes the cause of this condition is not known. What increases the risk? You are more likely to develop this condition if: You have an underlying lung problem. You smoke. You are 31-57 years old, male, tall, and underweight. You have a personal or family history of pneumothorax. You have an eating disorder (anorexia nervosa). This condition can also happen quickly, even in people with no history of lung problems. What are the signs or symptoms? Sometimes a pneumothorax will have no symptoms. When symptoms are present, they can include: Chest pain. Shortness of breath. Increased rate of breathing. Bluish color to your lips or skin (cyanosis). How is this diagnosed? This condition may be diagnosed by: A  medical history and physical exam. A chest X-ray, chest CT scan, or ultrasound. How is this treated? Treatment depends on how severe your condition is. The goal of treatment is to remove the extra air and allow your lung to expand back to its normal size. For a small pneumothorax: No treatment may be needed. Extra oxygen is sometimes used to make it go away more quickly. For a large pneumothorax or a pneumothorax that is causing symptoms, a procedure is done to drain the air from your lungs. To do this, a health care provider may use: A needle with a syringe. This is used to suck air from a pleural space where no additional leakage is taking place. A chest tube. This is used to suck air where there is ongoing leakage into the pleural space. The chest tube may need to remain in place for several days until the air leak has healed. In  more severe cases, surgery may be needed to repair the damage that is causing the leak. If you have multiple pneumothorax episodes or have an air leak that will not heal, a procedure called a pleurodesis may be done. A medicine is placed in the pleural space to irritate the tissues around the lung so that the lung will stick to the chest wall, seal any leaks, and stop any buildup of air in that space. If you have an underlying lung problem, severe symptoms, or a large pneumothorax you will usually need to stay in the hospital. Follow these instructions at home: Lifestyle Do not use any products that contain nicotine or tobacco, such as cigarettes and e-cigarettes. These are major risk factors in pneumothorax. If you need help quitting, ask your health care provider. Do not lift anything that is heavier than 10 lb (4.5 kg), or the limit that your health care provider tells you, until he or she says that it is safe. Avoid activities that take a lot of effort (strenuous) for as long as told by your health care provider. Return to your normal activities as told by your  health care provider. Ask your health care provider what activities are safe for you. Do not fly in an airplane or scuba dive until your health care provider says it is okay. General instructions Take over-the-counter and prescription medicines only as told by your health care provider. If a cough or pain makes it difficult for you to sleep at night, try sleeping in a semi-upright position in a recliner or by using 2 or 3 pillows. If you had a chest tube and it was removed, ask your health care provider when you can remove the bandage (dressing). While the dressing is in place, do not allow it to get wet. Keep all follow-up visits as told by your health care provider. This is important. Contact a health care provider if: You cough up thick mucus (sputum) that is yellow or green in color. You were treated with a chest tube, and you have redness, increasing pain, or discharge at the site where it was placed. Get help right away if: You have increasing chest pain or shortness of breath. You have a cough that will not go away. You begin coughing up blood. You have pain that is getting worse or is not controlled with medicines. The site where your chest tube was located opens up. You feel air coming out of the site where the chest tube was placed. You have a fever or persistent symptoms for more than 2-3 days. You have a fever and your symptoms suddenly get worse. These symptoms may represent a serious problem that is an emergency. Do not wait to see if the symptoms will go away. Get medical help right away. Call your local emergency services (911 in the [place] Do not drive yourself to the hospital. Summary A pneumothorax, commonly called a collapsed lung, is a condition in which air leaks from a lung and gets trapped between the lung and the chest wall (pleural space). The buildup of air may be small or large. A small pneumothorax may go away on its own. A large pneumothorax will require treatment  and hospitalization. Treatment for this condition depends on how severe the pneumothorax is. The goal of treatment is to remove the extra air and allow the lung to expand back to its normal size. This information is not intended to replace advice given to you by your health care provider. Make sure  you discuss any questions you have with your health care provider. Document Released: 02/15/2005 Document Revised: 01/24/2017 Document Reviewed: 01/24/2017 Elsevier Interactive Patient Education  2019 [employer].
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
## 2021-02-20 NOTE — ED Notes (Signed)
Repeat EKG completed
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
## 2021-02-20 NOTE — ED Notes (Signed)
Incentive spirometer 2500
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
## 2021-03-03 ENCOUNTER — Other Ambulatory Visit: Payer: Self-pay

## 2021-03-03 ENCOUNTER — Ambulatory Visit: Payer: 59 | Admitting: Speech Pathology

## 2021-03-03 ENCOUNTER — Encounter: Payer: Self-pay | Admitting: Family Medicine

## 2021-03-03 ENCOUNTER — Ambulatory Visit (INDEPENDENT_AMBULATORY_CARE_PROVIDER_SITE_OTHER)
Admission: RE | Admit: 2021-03-03 | Discharge: 2021-03-03 | Disposition: A | Discharge status: Home / Self Care | Payer: 59 | Source: Ambulatory Visit | Attending: Family Medicine | Admitting: Family Medicine

## 2021-03-03 ENCOUNTER — Ambulatory Visit (INDEPENDENT_AMBULATORY_CARE_PROVIDER_SITE_OTHER): Payer: Self-pay | Admitting: Family Medicine

## 2021-03-03 VITALS — BP 104/80 | HR 88 | Temp 98.1°F | Ht 73.0 in | Wt 230.0 lb

## 2021-03-03 DIAGNOSIS — M25512 Pain in left shoulder: Secondary | ICD-10-CM

## 2021-03-03 DIAGNOSIS — R0781 Pleurodynia: Secondary | ICD-10-CM

## 2021-03-03 DIAGNOSIS — S060X1D Concussion with loss of consciousness of 30 minutes or less, subsequent encounter: Secondary | ICD-10-CM

## 2021-03-03 MED ORDER — OXYCODONE HCL 5 MG PO TABS: 5.0000 mg | ORAL_TABLET | Freq: Four times a day (QID) | ORAL | 0 refills | Status: DC | PRN

## 2021-03-03 NOTE — Patient Instructions (Addendum)
Conditions to go back to work:  No concussion symptoms at rest or with exercise.   No oxycodone use or needed.   Normal functional status with your shoulder.   Go to the lab on the way out.   If you have mychart we'll likely use that to update you.    We'll set up seeing ortho.  Take care.  Glad to see you.
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
## 2021-03-04 DIAGNOSIS — S060XAA Concussion with loss of consciousness status unknown, initial encounter: Secondary | ICD-10-CM | POA: Insufficient documentation

## 2021-03-04 DIAGNOSIS — R0781 Pleurodynia: Secondary | ICD-10-CM | POA: Insufficient documentation

## 2021-03-04 DIAGNOSIS — M25512 Pain in left shoulder: Secondary | ICD-10-CM | POA: Insufficient documentation

## 2021-03-04 NOTE — Assessment & Plan Note (Signed)
Discussed options.  Refer to orthopedics.  See notes on imaging today.  He can use oxycodone as needed, as he is also using it for rib pain.

## 2021-03-04 NOTE — Assessment & Plan Note (Signed)
Can use oxycodone with routine cautions.  He agrees with plan.  Discussed his situation overall, ie his conditions to go back to work:  No concussion symptoms at rest or with exercise.   No oxycodone use or needed.   Normal functional status with his shoulder.  He agrees with plan.  30 minutes were devoted to patient care in this encounter (this includes time spent reviewing the patient's file/history, interviewing and examining the patient, counseling/reviewing plan with patient).
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
## 2021-03-05 ENCOUNTER — Other Ambulatory Visit: Payer: Self-pay

## 2021-03-05 ENCOUNTER — Encounter
Payer: No Typology Code available for payment source | Attending: Physical Medicine and Rehabilitation | Admitting: Physical Medicine and Rehabilitation

## 2021-03-05 VITALS — BP 117/85 | HR 110 | Ht 72.0 in | Wt 226.0 lb

## 2021-03-05 DIAGNOSIS — G4701 Insomnia due to medical condition: Secondary | ICD-10-CM | POA: Diagnosis present

## 2021-03-05 DIAGNOSIS — M7542 Impingement syndrome of left shoulder: Secondary | ICD-10-CM | POA: Diagnosis present

## 2021-03-05 DIAGNOSIS — K5900 Constipation, unspecified: Secondary | ICD-10-CM | POA: Insufficient documentation

## 2021-03-05 DIAGNOSIS — S060X1D Concussion with loss of consciousness of 30 minutes or less, subsequent encounter: Secondary | ICD-10-CM | POA: Insufficient documentation

## 2021-03-05 MED ORDER — GABAPENTIN 300 MG PO CAPS: 300.0000 mg | ORAL_CAPSULE | Freq: Every day | ORAL | 3 refills | Status: DC

## 2021-03-05 NOTE — Patient Instructions (Signed)
-  Discussed following foods that may reduce pain: 1) Ginger (especially studied for arthritis)- reduce leukotriene production to decrease inflammation 2) Blueberries- high in phytonutrients that decrease inflammation 3) Salmon- marine omega-3s reduce joint swelling and pain 4) Pumpkin seeds- reduce inflammation 5) dark chocolate- reduces inflammation 6) turmeric- reduces inflammation 7) tart cherries - reduce pain and stiffness 8) extra virgin olive oil - its compound olecanthal helps to block prostaglandins  9) chili peppers- can be eaten or applied topically via capsaicin 10) mint- helpful for headache, muscle aches, joint pain, and itching 11) garlic- reduces inflammation  Link to further information on diet for chronic pain: [email or web address].[email or web address]   Constipation:  Magnesium  -Provided list of following foods that help with constipation and highlighted a few: 1) prunes- contain high amounts of fiber.  2) apples- has a form of dietary fiber called pectin that accelerates stool movement and increases beneficial gut bacteria 3) pears- in addition to fiber, also high in fructose and sorbitol which have laxative effect 4) figs- contain an enzyme ficin which helps to speed colonic transit 5) kiwis- contain an enzyme actinidin that improves gut motility and reduces constipation 6) oranges- rich in pectin (like apples) 7) grapefruits- contain a flavanol naringenin which has a laxative effect 8) vegetables- rich in fiber and also great sources of folate, vitamin C, and K 9) artichoke- high in inulin, prebiotic great for the microbiome 10) chicory- increases stool frequency and softness (can be added to coffee) 11) rhubarb- laxative effect 12) sweet potato- high fiber 13) beans, peas, and lentils- contain both soluble and insoluble fiber 14) chia seeds- improves intestinal health and gut flora 15) flaxseeds- laxative  effect 16) whole grain rye bread- high in fiber 17) oat bran- high in soluble and insoluble fiber 18) kefir- softens stools -recommended to try at least one of these foods every day.  -drink 6-8 glasses of water per day -walk regularly, especially after meals.
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
## 2021-03-16 ENCOUNTER — Telehealth: Payer: Self-pay | Admitting: Family Medicine
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
## 2021-03-16 NOTE — Telephone Encounter (Signed)
Spoke with patient about below; patient verbalized understanding and will do so.
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
## 2021-03-18 ENCOUNTER — Other Ambulatory Visit: Payer: Self-pay

## 2021-03-18 ENCOUNTER — Emergency Department (HOSPITAL_COMMUNITY): Payer: 59 | Attending: Emergency Medicine

## 2021-03-18 ENCOUNTER — Observation Stay (HOSPITAL_COMMUNITY)
Admission: EM | Admit: 2021-03-18 | Discharge: 2021-03-19 | Disposition: A | Discharge status: Home / Self Care | Payer: No Typology Code available for payment source | Attending: Internal Medicine | Admitting: Internal Medicine

## 2021-03-18 ENCOUNTER — Emergency Department (HOSPITAL_COMMUNITY): Payer: 59

## 2021-03-18 DIAGNOSIS — Z20822 Contact with and (suspected) exposure to covid-19: Secondary | ICD-10-CM | POA: Diagnosis not present

## 2021-03-18 DIAGNOSIS — R109 Unspecified abdominal pain: Secondary | ICD-10-CM | POA: Diagnosis present

## 2021-03-18 DIAGNOSIS — I81 Portal vein thrombosis: Secondary | ICD-10-CM | POA: Diagnosis not present

## 2021-03-18 DIAGNOSIS — R52 Pain, unspecified: Secondary | ICD-10-CM

## 2021-03-18 LAB — CBC WITH DIFFERENTIAL/PLATELET
Abs Immature Granulocytes: 0.02 10*3/uL (ref 0.00–0.07)
Basophils Absolute: 0.1 10*3/uL (ref 0.0–0.1)
Basophils Relative: 1 %
Eosinophils Absolute: 0 10*3/uL (ref 0.0–0.5)
Eosinophils Relative: 0 %
HCT: 43.5 % (ref 39.0–52.0)
Hemoglobin: 14.6 g/dL (ref 13.0–17.0)
Immature Granulocytes: 0 %
Lymphocytes Relative: 15 %
Lymphs Abs: 1.5 10*3/uL (ref 0.7–4.0)
MCH: 29.8 pg (ref 26.0–34.0)
MCHC: 33.6 g/dL (ref 30.0–36.0)
MCV: 88.8 fL (ref 80.0–100.0)
Monocytes Absolute: 0.9 10*3/uL (ref 0.1–1.0)
Monocytes Relative: 10 %
Neutro Abs: 7.1 10*3/uL (ref 1.7–7.7)
Neutrophils Relative %: 74 %
Platelets: 368 10*3/uL (ref 150–400)
RBC: 4.9 MIL/uL (ref 4.22–5.81)
RDW: 12.1 % (ref 11.5–15.5)
WBC: 9.6 10*3/uL (ref 4.0–10.5)
nRBC: 0 % (ref 0.0–0.2)

## 2021-03-18 LAB — COMPREHENSIVE METABOLIC PANEL
ALT: 52 U/L — ABNORMAL HIGH (ref 0–44)
AST: 25 U/L (ref 15–41)
Albumin: 3.8 g/dL (ref 3.5–5.0)
Alkaline Phosphatase: 120 U/L (ref 38–126)
Anion gap: 10 (ref 5–15)
BUN: 12 mg/dL (ref 6–20)
CO2: 25 mmol/L (ref 22–32)
Calcium: 9.1 mg/dL (ref 8.9–10.3)
Chloride: 102 mmol/L (ref 98–111)
Creatinine, Ser: 0.97 mg/dL (ref 0.61–1.24)
GFR, Estimated: >60 mL/min (ref >60)
Glucose, Bld: 114 mg/dL — ABNORMAL HIGH (ref 70–99)
Potassium: 4 mmol/L (ref 3.5–5.1)
Sodium: 137 mmol/L (ref 135–145)
Total Bilirubin: 0.4 mg/dL (ref 0.3–1.2)
Total Protein: 7.3 g/dL (ref 6.5–8.1)

## 2021-03-18 LAB — LIPASE, BLOOD: Lipase: 40 U/L (ref 11–51)

## 2021-03-18 LAB — RESP PANEL BY RT-PCR (FLU A&B, COVID) ARPGX2
Influenza A by PCR: NEGATIVE
Influenza B by PCR: NEGATIVE
SARS Coronavirus 2 by RT PCR: NEGATIVE

## 2021-03-18 MED ORDER — SENNOSIDES-DOCUSATE SODIUM 8.6-50 MG PO TABS
1.0000 | ORAL_TABLET | Freq: Every evening | ORAL | Status: DC | PRN
Start: 2021-03-18 — End: 2021-03-19

## 2021-03-18 MED ORDER — POLYETHYLENE GLYCOL 3350 17 G PO PACK: 17.0000 g | PACK | Freq: Every day | ORAL | Status: DC | PRN

## 2021-03-18 MED ORDER — ACETAMINOPHEN 650 MG RE SUPP: 650.0000 mg | Freq: Four times a day (QID) | RECTAL | Status: DC | PRN

## 2021-03-18 MED ORDER — ACETAMINOPHEN 325 MG PO TABS
650.0000 mg | ORAL_TABLET | Freq: Four times a day (QID) | ORAL | Status: DC | PRN
  Filled 2021-03-18: qty 2

## 2021-03-18 MED ORDER — ONDANSETRON HCL 4 MG/2ML IJ SOLN: 4.0000 mg | Freq: Four times a day (QID) | INTRAMUSCULAR | Status: DC | PRN

## 2021-03-18 MED ORDER — ONDANSETRON HCL 4 MG PO TABS: 4.0000 mg | ORAL_TABLET | Freq: Four times a day (QID) | ORAL | Status: DC | PRN

## 2021-03-18 MED ORDER — HEPARIN (PORCINE) 25000 UT/250ML-% IV SOLN
1800.0000 [IU]/h | INTRAVENOUS | Status: DC
  Administered 2021-03-18: 20:00:00 1650 [IU]/h via INTRAVENOUS
  Filled 2021-03-18 (×2): qty 250

## 2021-03-18 MED ORDER — HEPARIN BOLUS VIA INFUSION
6700.0000 [IU] | Freq: Once | INTRAVENOUS | Status: AC
  Administered 2021-03-18: 6700 [IU] via INTRAVENOUS
  Filled 2021-03-18: qty 6700

## 2021-03-18 MED ORDER — IOHEXOL 300 MG/ML  SOLN
100.0000 mL | Freq: Once | INTRAMUSCULAR | Status: AC | PRN
  Administered 2021-03-18: 100 mL via INTRAVENOUS
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
## 2021-03-18 NOTE — Assessment & Plan Note (Addendum)
Findings consistent with portal vein thrombosis seen on CT imaging, confirmed on Doppler ultrasound of the liver.  EDP discussed with on-call vascular surgery who recommended medical admission for anticoagulation and they will round on patient in the morning. -Continue IV heparin per pharmacy -Vascular surgery to see -Anticipate transition to DOAC in next 1-2 days

## 2021-03-18 NOTE — ED Notes (Signed)
Patient transported to CT 
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
## 2021-03-18 NOTE — Progress Notes (Signed)
ANTICOAGULATION CONSULT NOTE - Initial Consult  Pharmacy Consult for IV heparin Indication:  portal vein thrombosis  No Known Allergies  Patient Measurements:   Heparin Dosing Weight: 98.5 kg  Vital Signs: Temp: 98.4 F (36.9 C) (01/18 1113) Temp Source: Oral (01/18 1113) BP: 120/83 (01/18 1521) Pulse Rate: 60 (01/18 1521)  Labs: Recent Labs    03/18/21 1118  HGB 14.6  HCT 43.5  PLT 368  CREATININE 0.97    Estimated Creatinine Clearance: 127.9 mL/min (by C-G formula based on SCr of 0.97 mg/dL).   Medical History: Past Medical History:  Diagnosis Date   Allergy 11/1996   Finger fracture    Left knee pain    dx'd with L retropatellar knee pain   Assessment: 40 YOM presenting with abdominal pain to ED. CT abdomen positive for portal vein thrombosis. No history of anticoagulation prior to admission. Pharmacy to dose IV heparin.   Hgb and platelets are within normal limits, renal function appears to be at baseline.   Goal of Therapy:  Heparin level 0.3-0.7 units/ml Monitor platelets by anticoagulation protocol: Yes   Plan:  IV heparin 6700 units x1 Start IV heparin 1650 units/h 6h heparin level check Daily heparin level, CBC Monitor for signs and symptoms of bleeding Follow-up long-term AC plan  Thank you for involving pharmacy in this patient's care.  Arnette Felts, PharmD PGY1 Ambulatory Care Pharmacy Resident 03/18/2021 7:03 PM  **Pharmacist phone directory can be found on [email or web address] listed under [facility]**
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
## 2021-03-18 NOTE — ED Notes (Signed)
Pt given [place] sandwich and soda, per MD

## 2021-03-19 ENCOUNTER — Telehealth: Payer: Self-pay | Admitting: Family Medicine

## 2021-03-19 DIAGNOSIS — I81 Portal vein thrombosis: Secondary | ICD-10-CM | POA: Diagnosis not present

## 2021-03-19 DIAGNOSIS — R1010 Upper abdominal pain, unspecified: Secondary | ICD-10-CM

## 2021-03-19 LAB — CBC
HCT: 42.4 % (ref 39.0–52.0)
Hemoglobin: 13.9 g/dL (ref 13.0–17.0)
MCH: 29.5 pg (ref 26.0–34.0)
MCHC: 32.8 g/dL (ref 30.0–36.0)
MCV: 90 fL (ref 80.0–100.0)
Platelets: 337 10*3/uL (ref 150–400)
RBC: 4.71 MIL/uL (ref 4.22–5.81)
RDW: 12.2 % (ref 11.5–15.5)
WBC: 8.7 10*3/uL (ref 4.0–10.5)
nRBC: 0 % (ref 0.0–0.2)

## 2021-03-19 LAB — HEPARIN LEVEL (UNFRACTIONATED)
Heparin Unfractionated: 0.24 IU/mL — ABNORMAL LOW (ref 0.30–0.70)
Heparin Unfractionated: 0.59 IU/mL (ref 0.30–0.70)

## 2021-03-19 LAB — HIV ANTIBODY (ROUTINE TESTING W REFLEX): HIV Screen 4th Generation wRfx: NONREACTIVE

## 2021-03-19 MED ORDER — OXYCODONE HCL 5 MG PO TABS
5.0000 mg | ORAL_TABLET | ORAL | Status: DC | PRN
  Administered 2021-03-19: 5 mg via ORAL
  Filled 2021-03-19: qty 1

## 2021-03-19 MED ORDER — HEPARIN BOLUS VIA INFUSION
1500.0000 [IU] | Freq: Once | INTRAVENOUS | Status: AC
  Administered 2021-03-19: 1500 [IU] via INTRAVENOUS
  Filled 2021-03-19: qty 1500

## 2021-03-19 MED ORDER — APIXABAN (ELIQUIS) VTE STARTER PACK (10MG AND 5MG): ORAL_TABLET | ORAL | 0 refills | Status: DC

## 2021-03-19 MED ORDER — APIXABAN 5 MG PO TABS
10.0000 mg | ORAL_TABLET | Freq: Once | ORAL | Status: AC
  Administered 2021-03-19: 10 mg via ORAL
  Filled 2021-03-19: qty 2

## 2021-03-19 NOTE — TOC Transition Note (Signed)
Transition of Care (TOC) - CM/SW Discharge Note Donn Pierini RN, BSN Transitions of Care Unit 4E- RN Case Manager See Treatment Team for direct phone #    Patient Details  Name: Nathan Rowland MRN: [id number] Date of Birth: 1980/09/24  [facility] of [facility]) CM/SW Contact:  Darrold Span, RN Phone Number: 03/19/2021, 4:16 PM   Clinical Narrative:    Pt admitted with thrombosis, from home. 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 Benefits check submitted for Eliquis- copay $50.   CM in to speak with pt- per conversation pt has spoken with his WC-adjuster and this visit is to be covered under his worker's comp. Pt provided info release form to sign for worker's comp to be able to get needed documentation. Pt provided info on Eliquis as well as cards to assist with both 30 day free and copay assist- pt plans to f/u with [facility] to see if they will cover cost.   Patient provided info for his worker's comp:  [employer] [address] [address] [place], [place] [address] Adjuster: Irish Lack [phone] ext. [phone] [phone] Email- [email or web address] Claim #- [id number]   Final next level of care: Home/Self Care Barriers to Discharge: No Barriers Identified   Patient Goals and CMS Choice Patient states their goals for this hospitalization and ongoing recovery are:: return home   Choice offered to / list presented to : NA  Discharge Placement               Home        Discharge Plan and Services   Discharge Planning Services: CM Consult, Medication Assistance Post Acute Care Choice: NA          DME Arranged: N/A DME Agency: NA       HH Arranged: NA HH Agency: NA        Social Determinants of Health (SDOH) Interventions     Readmission Risk Interventions No flowsheet data found.

## 2021-03-19 NOTE — TOC Benefit Eligibility Note (Signed)
Transition of Care [facility]) Benefit Eligibility Note    Patient Details  Name: ORDEAN FOUTS MRN: [id number] Date of Birth: 1980-03-15   Medication/Dose: Eliquis 5 mg BID  Covered?: Yes  Tier: 3 Drug  Prescription Coverage Preferred Pharmacy: local  Spoke with Person/Company/Phone Number:: Eric/ [employer] [phone]  Co-Pay: $50.00  Prior Approval: No          Caren Macadam Phone Number: 03/19/2021, 3:42 PM

## 2021-03-19 NOTE — Progress Notes (Incomplete)
°  Progress Note   Patient: Nathan Rowland SPQ:[id number] DOB: [birth year] DOA: 03/18/2021     0 DOS: the patient was seen and examined on 03/19/2021   Brief hospital course: No notes on file  Assessment and Plan * Portal vein thrombosis- (present on admission) Findings consistent with portal vein thrombosis seen on CT imaging, confirmed on Doppler ultrasound of the liver.  EDP discussed with on-call vascular surgery who recommended medical admission for anticoagulation and they will round on patient in the morning. -Continue IV heparin per pharmacy -Vascular surgery to see -Anticipate transition to [facility] in next 1-2 days   {Tip this will not be part of the note when signed There is no height or weight on file to calculate BMI. ,  ,     (Optional):26781}  Subjective: ***  Objective {Vitals:26382} ***  Data Reviewed: {Tip this will not be part of the note when signed- Document your independent interpretation of telemetry tracing, EKG, lab, Radiology test or any other diagnostic tests. Add any new diagnostic test ordered today. (Optional):26781} {Results:26384}  Family Communication: ***  Disposition: Status is: Observation  {Observation:23811}     {Tip this will not be part of the note when signed  DVT Prophylaxis  .,       (Optional):26781}   Time spent: *** minutes  Author: Calvert Cantor, MD 03/19/2021 4:44 PM  For on call review [email or web address].
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
## 2021-03-19 NOTE — Discharge Instructions (Addendum)
Dr Roselie Awkward with vascular surgery has recommended at least 3 months of the blood thinner. He would like to to see a GI doctor to decide when it should ultimately be stopped.   You were cared for by a hospitalist during your hospital stay. If you have any questions about your discharge medications or the care you received while you were in the hospital after you are discharged, you can call the unit and asked to speak with the hospitalist on call if the hospitalist that took care of you is not available. Once you are discharged, your primary care physician will handle any further medical issues.   Please note that NO REFILLS for any discharge medications will be authorized once you are discharged, as it is imperative that you return to your primary care physician (or establish a relationship with a primary care physician if you do not have one) for your aftercare needs so that they can reassess your need for medications and monitor your lab values.  Please take all your medications with you for your next visit with your Primary MD. Please ask your Primary MD to get all Hospital records sent to his/her office. Please request your Primary MD to go over all hospital test results at the follow up.   If you experience worsening of your admission symptoms, develop shortness of breath, chest pain, suicidal or homicidal thoughts or a life threatening emergency, you must seek medical attention immediately by calling 911 or calling your MD.   Dennis Bast must read the complete instructions/literature along with all the possible adverse reactions/side effects for all the medicines you take including new medications that have been prescribed to you. Take new medicines after you have completely understood and accpet all the possible adverse reactions/side effects.    Do not drive when taking pain medications or sedatives.     Do not take more than prescribed Pain, Sleep and Anxiety Medications   If you have smoked or  chewed Tobacco in the last 2 yrs please stop. Stop any regular alcohol  and or recreational drug use.   Wear Seat belts while driving.       Information on my medicine - ELIQUIS (apixaban)  Why was Eliquis prescribed for you? Eliquis was prescribed to treat blood clots that may have been found in the veins of your legs (deep vein thrombosis) or in your lungs (pulmonary embolism) and to reduce the risk of them occurring again.  What do You need to know about Eliquis ? The starting dose is 10 mg (two 5 mg tablets) taken TWICE daily for the FIRST SEVEN (7) DAYS, then on (enter date)  03/26/21  the dose is reduced to ONE 5 mg tablet taken TWICE daily.  Eliquis may be taken with or without food.   Try to take the dose about the same time in the morning and in the evening. If you have difficulty swallowing the tablet whole please discuss with your pharmacist how to take the medication safely.  Take Eliquis exactly as prescribed and DO NOT stop taking Eliquis without talking to the doctor who prescribed the medication.  Stopping may increase your risk of developing a new blood clot.  Refill your prescription before you run out.  After discharge, you should have regular check-up appointments with your healthcare provider that is prescribing your Eliquis.    What do you do if you miss a dose? If a dose of ELIQUIS is not taken at the scheduled time, take it as soon  as possible on the same day and twice-daily administration should be resumed. The dose should not be doubled to make up for a missed dose.  Important Safety Information A possible side effect of Eliquis is bleeding. You should call your healthcare provider right away if you experience any of the following: Bleeding from an injury or your nose that does not stop. Unusual colored urine (red or dark brown) or unusual colored stools (red or black). Unusual bruising for unknown reasons. A serious fall or if you hit your head (even  if there is no bleeding).  Some medicines may interact with Eliquis and might increase your risk of bleeding or clotting while on Eliquis. To help avoid this, consult your healthcare provider or pharmacist prior to using any new prescription or non-prescription medications, including herbals, vitamins, non-steroidal anti-inflammatory drugs (NSAIDs) and supplements.  This website has more information on Eliquis (apixaban): http://www.eliquis.com/eliquis/home

## 2021-03-19 NOTE — Progress Notes (Signed)
Patient arrived to 4E from ED. Vitals taken and stable. Patient placed on tele and CCMD notified. Patient in excruciating pain. Called vascular MD ordered 5 mg Oxycodone PRN. Patient oriented to unit and call bell within reach. Family at the bedside.   Nathan Rowland

## 2021-03-19 NOTE — Telephone Encounter (Signed)
Called pt. He is going to be seen by vascular surgery.  I had seen his intake notes.  Will await consult notes and d/c summary.  No prev h/o thrombosis and recent injury hx noted.  I thanked him for taking the call.  He thanked me for calling.

## 2021-03-19 NOTE — Telephone Encounter (Signed)
Patient called, wanted to make sure his Provider new that he was admitted to the hospital yesterday and they found a blood clot.

## 2021-03-19 NOTE — Progress Notes (Signed)
ANTICOAGULATION CONSULT NOTE  Pharmacy Consult for IV heparin Indication:  portal vein thrombosis  No Known Allergies  Patient Measurements:   Heparin Dosing Weight: 98.5 kg  Vital Signs: BP: 122/80 (01/19 0100) Pulse Rate: 94 (01/19 0100)  Labs: Recent Labs    03/18/21 1118 03/19/21 0322  HGB 14.6  --   HCT 43.5  --   PLT 368  --   HEPARINUNFRC  --  0.24*  CREATININE 0.97  --      Estimated Creatinine Clearance: 127.9 mL/min (by C-G formula based on SCr of 0.97 mg/dL).   Medical History: Past Medical History:  Diagnosis Date   Allergy 11/1996   Finger fracture    Left knee pain    dx'd with L retropatellar knee pain   Assessment: 40 YOM presenting with abdominal pain to ED. CT abdomen positive for portal vein thrombosis. No history of anticoagulation prior to admission. Pharmacy to dose IV heparin.   Currently on IV heparin at 1650 units/hr. Initial HL is subtherapeutic at 0.24. RN reports no s/s of overt bleeding   Goal of Therapy:  Heparin level 0.3-0.7 units/ml Monitor platelets by anticoagulation protocol: Yes   Plan:  Heparin 1500 units IV bolus  Increase IV heparin 1800 units/h 6h heparin level check Daily heparin level, CBC Monitor for signs and symptoms of bleeding Follow-up long-term AC plan  Thank you for involving pharmacy in this patient's care.  Vinnie Level, PharmD., BCPS, BCCCP Clinical Pharmacist Please refer to [facility] for unit-specific pharmacist
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
## 2021-03-19 NOTE — Progress Notes (Signed)
ANTICOAGULATION CONSULT NOTE  Pharmacy Consult for IV heparin Indication:  portal vein thrombosis  No Known Allergies  Patient Measurements:   Heparin Dosing Weight: 98.5 kg  Vital Signs: Temp: 98.2 F (36.8 C) (01/19 0721) Temp Source: Oral (01/19 0721) BP: 124/72 (01/19 1300) Pulse Rate: 106 (01/19 1300)  Labs: Recent Labs    03/18/21 1118 03/19/21 0322 03/19/21 1257  HGB 14.6 13.9  --   HCT 43.5 42.4  --   PLT 368 337  --   HEPARINUNFRC  --  0.24* 0.59  CREATININE 0.97  --   --      CrCl cannot be calculated (Unknown ideal weight.).   Medical History: Past Medical History:  Diagnosis Date   Allergy 11/1996   Finger fracture    Left knee pain    dx'd with L retropatellar knee pain   Assessment: 40 YOM presenting with abdominal pain to ED. CT abdomen positive for portal vein thrombosis. No history of anticoagulation prior to admission. Pharmacy to dose IV heparin.   Heparin level therapeutic s/p rate increase to 1800 units/hr  Goal of Therapy:  Heparin level 0.3-0.7 units/ml Monitor platelets by anticoagulation protocol: Yes   Plan:  Continue heparin gtt at 1800 units/hr Daily heparin level, CBC, s/s bleeding F/u long term AC plan and ability to transition to PO  Daylene Posey, PharmD Clinical Pharmacist ED Pharmacist Phone # [phone] 03/19/2021 2:06 PM

## 2021-03-20 NOTE — Telephone Encounter (Signed)
Happy to see patient and if ongoing concerns or symptoms, then please schedule.  It is likely more important for him to f/u with GI.  If he needs help getting that set up, then please let me know.  Thanks.

## 2021-03-20 NOTE — Telephone Encounter (Signed)
Called and spoke with patient. He is okay right now but if he decides he needs to f/u with Dr. Para March he will call back and schedule. He stated he is getting set up with GI; his workers comp is handling that.

## 2021-03-20 NOTE — Telephone Encounter (Signed)
Pt called in wants to know if he need to come in for a follow visit with Dr. Damita Dunnings . Please Advise [phone]

## 2021-03-23 ENCOUNTER — Telehealth: Payer: Self-pay

## 2021-03-23 NOTE — Telephone Encounter (Signed)
Noted. Thanks.

## 2021-03-23 NOTE — Telephone Encounter (Signed)
Transition Care Management Follow-up Telephone Call Date of discharge and from where: TCM [facility] 03-19-21 Dx: Portal Vein thrombosis How have you been since you were released from the hospital? Getting better  Any questions or concerns? No  Items Reviewed: Did the pt receive and understand the discharge instructions provided? Yes  Medications obtained and verified? Yes  Other? No  Any new allergies since your discharge? no Dietary orders reviewed? Yes Do you have support at home? Yes   Home Care and Equipment/Supplies: Were home health services ordered? no If so, what is the name of the agency? na  Has the agency set up a time to come to the patient's home? not applicable Were any new equipment or medical supplies ordered?  No What is the name of the medical supply agency? na Were you able to get the supplies/equipment? not applicable Do you have any questions related to the use of the equipment or supplies? No  Functional Questionnaire: (I = Independent and D = Dependent) ADLs: I  Bathing/Dressing- I  Meal Prep- I  Eating- I  Maintaining continence- I  Transferring/Ambulation- I  Managing Meds- I  Follow up appointments reviewed:  PCP Hospital f/u appt confirmed? Yes  Scheduled to see Dr Para March on 03-27-21 @ 230pm. Specialist [facility] f/u appt confirmed? No  -states worker comp is arranging appt with GI . Are transportation arrangements needed? No  If their condition worsens, is the pt aware to call PCP or go to the Emergency Dept.? Yes Was the patient provided with contact information for the PCP's office or ED? Yes Was to pt encouraged to call back with questions or concerns? Yes

## 2021-03-27 ENCOUNTER — Encounter: Payer: Self-pay | Admitting: Family Medicine

## 2021-03-27 ENCOUNTER — Ambulatory Visit (INDEPENDENT_AMBULATORY_CARE_PROVIDER_SITE_OTHER): Payer: Self-pay | Admitting: Family Medicine

## 2021-03-27 ENCOUNTER — Other Ambulatory Visit: Payer: Self-pay

## 2021-03-27 VITALS — BP 122/72 | HR 94 | Temp 98.2°F | Ht 72.0 in | Wt 226.0 lb

## 2021-03-27 DIAGNOSIS — I81 Portal vein thrombosis: Secondary | ICD-10-CM

## 2021-03-27 DIAGNOSIS — R7989 Other specified abnormal findings of blood chemistry: Secondary | ICD-10-CM

## 2021-03-27 MED ORDER — OXYCODONE HCL 5 MG PO TABS: 5.0000 mg | ORAL_TABLET | Freq: Four times a day (QID) | ORAL | 0 refills | Status: DC | PRN
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
## 2021-03-27 NOTE — Patient Instructions (Addendum)
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  Either way, let me know.    Take care.  Glad to see you.  Go to the lab on the way out.   If you have mychart we'll likely use that to update you.     You can use miralax daily.  Try 1/2 tab of oxycodone prior to bed and see if that helps.  I would presume you'll need some bowel regimen while on pain medication.   Let me know if you need help getting a follow up appointment (for the 3 month follow up with either vascular or GI or both).

## 2021-03-28 LAB — CBC WITH DIFFERENTIAL/PLATELET
Absolute Monocytes: 763 cells/uL (ref 200–950)
Basophils Absolute: 47 cells/uL (ref 0–200)
Basophils Relative: 0.5 %
Eosinophils Absolute: 0 cells/uL — ABNORMAL LOW (ref 15–500)
Eosinophils Relative: 0 %
HCT: 43.5 % (ref 38.5–50.0)
Hemoglobin: 14.5 g/dL (ref 13.2–17.1)
Lymphs Abs: 3004 cells/uL (ref 850–3900)
MCH: 29.5 pg (ref 27.0–33.0)
MCHC: 33.3 g/dL (ref 32.0–36.0)
MCV: 88.6 fL (ref 80.0–100.0)
MPV: 10.8 fL (ref 7.5–12.5)
Monocytes Relative: 8.2 %
Neutro Abs: 5487 cells/uL (ref 1500–7800)
Neutrophils Relative %: 59 %
Platelets: 358 10*3/uL (ref 140–400)
RBC: 4.91 10*6/uL (ref 4.20–5.80)
RDW: 12.2 % (ref 11.0–15.0)
Total Lymphocyte: 32.3 %
WBC: 9.3 10*3/uL (ref 3.8–10.8)

## 2021-03-28 LAB — COMPREHENSIVE METABOLIC PANEL
AG Ratio: 1.6 (calc) (ref 1.0–2.5)
ALT: 48 U/L — ABNORMAL HIGH (ref 9–46)
AST: 20 U/L (ref 10–40)
Albumin: 4.2 g/dL (ref 3.6–5.1)
Alkaline phosphatase (APISO): 116 U/L (ref 36–130)
BUN: 10 mg/dL (ref 7–25)
CO2: 29 mmol/L (ref 20–32)
Calcium: 9.3 mg/dL (ref 8.6–10.3)
Chloride: 105 mmol/L (ref 98–110)
Creat: 0.97 mg/dL (ref 0.60–1.26)
Globulin: 2.7 g/dL (calc) (ref 1.9–3.7)
Glucose, Bld: 94 mg/dL (ref 65–99)
Potassium: 4 mmol/L (ref 3.5–5.3)
Sodium: 141 mmol/L (ref 135–146)
Total Bilirubin: 0.4 mg/dL (ref 0.2–1.2)
Total Protein: 6.9 g/dL (ref 6.1–8.1)

## 2021-03-29 ENCOUNTER — Other Ambulatory Visit: Payer: Self-pay | Admitting: Family Medicine

## 2021-03-29 DIAGNOSIS — I81 Portal vein thrombosis: Secondary | ICD-10-CM

## 2021-03-29 NOTE — Assessment & Plan Note (Signed)
Continue anticoagulation.  Recheck labs today.  Abdominal exam is benign.  Continue.  Oxycodone use.  Likely reasonable to continue bowel regimen in the meantime, as long as he is on opiates.  I expect his situation to improve, his pain to decrease, and him to need less opiates/bowel regimen.  Not jaundiced.  He will follow-up with GI.  See after visit summary.  He can let me know if he needs help with a referral.  Okay for outpatient follow-up.  Routine anticoagulation cautions discussed with patient.

## 2021-04-17 ENCOUNTER — Telehealth: Payer: Self-pay | Admitting: Family Medicine

## 2021-04-17 DIAGNOSIS — J3089 Other allergic rhinitis: Secondary | ICD-10-CM | POA: Diagnosis not present

## 2021-04-17 DIAGNOSIS — H1045 Other chronic allergic conjunctivitis: Secondary | ICD-10-CM | POA: Diagnosis not present

## 2021-04-17 DIAGNOSIS — J3081 Allergic rhinitis due to animal (cat) (dog) hair and dander: Secondary | ICD-10-CM | POA: Diagnosis not present

## 2021-04-17 DIAGNOSIS — J301 Allergic rhinitis due to pollen: Secondary | ICD-10-CM | POA: Diagnosis not present

## 2021-04-17 MED ORDER — APIXABAN 5 MG PO TABS: 5.0000 mg | ORAL_TABLET | Freq: Two times a day (BID) | ORAL | 3 refills | Status: DC

## 2021-04-17 NOTE — Telephone Encounter (Signed)
Patient notified rx was sent 

## 2021-04-17 NOTE — Telephone Encounter (Signed)
Rx sent to continue the med at the routine dose.  Thanks.

## 2021-04-17 NOTE — Addendum Note (Signed)
Addended by: Joaquim Nam on: 04/17/2021 03:16 PM   Modules accepted: Orders

## 2021-04-17 NOTE — Telephone Encounter (Signed)
°  Encourage patient to contact the pharmacy for refills or they can request refills through [facility]  LAST APPOINTMENT DATE:  Please schedule appointment if longer than 1 year  NEXT APPOINTMENT DATE:  MEDICATION: APIXABAN (ELIQUIS) VTE STARTER PACK (10MG  AND 5MG )  Is the patient out of medication?  Yes  PHARMACY: [facility]-  [address]  Let patient know to contact pharmacy at the end of the day to make sure medication is ready.  Please notify patient to allow 48-72 hours to process  CLINICAL FILLS OUT ALL BELOW:   LAST REFILL:  QTY:  REFILL DATE:    OTHER COMMENTS:    Okay for refill?  Please advise

## 2021-04-17 NOTE — Telephone Encounter (Signed)
I was going to sent in refill but I need to verify what he needs to be taking now. Looks like a starter pack was sent before. Does he still need that or different dose?

## 2021-04-24 DIAGNOSIS — J3089 Other allergic rhinitis: Secondary | ICD-10-CM | POA: Diagnosis not present

## 2021-04-24 DIAGNOSIS — J301 Allergic rhinitis due to pollen: Secondary | ICD-10-CM | POA: Diagnosis not present

## 2021-04-24 DIAGNOSIS — J3081 Allergic rhinitis due to animal (cat) (dog) hair and dander: Secondary | ICD-10-CM | POA: Diagnosis not present

## 2021-04-28 ENCOUNTER — Other Ambulatory Visit (INDEPENDENT_AMBULATORY_CARE_PROVIDER_SITE_OTHER): Payer: 59

## 2021-04-28 ENCOUNTER — Other Ambulatory Visit: Payer: Self-pay

## 2021-04-28 DIAGNOSIS — I81 Portal vein thrombosis: Secondary | ICD-10-CM

## 2021-04-29 LAB — HEPATIC FUNCTION PANEL
ALT: 27 U/L (ref 0–53)
AST: 18 U/L (ref 0–37)
Albumin: 4.4 g/dL (ref 3.5–5.2)
Alkaline Phosphatase: 84 U/L (ref 39–117)
Bilirubin, Direct: 0.1 mg/dL (ref 0.0–0.3)
Total Bilirubin: 0.6 mg/dL (ref 0.2–1.2)
Total Protein: 7.2 g/dL (ref 6.0–8.3)

## 2021-05-06 DIAGNOSIS — J3089 Other allergic rhinitis: Secondary | ICD-10-CM | POA: Diagnosis not present

## 2021-05-06 DIAGNOSIS — J3081 Allergic rhinitis due to animal (cat) (dog) hair and dander: Secondary | ICD-10-CM | POA: Diagnosis not present

## 2021-05-06 DIAGNOSIS — J301 Allergic rhinitis due to pollen: Secondary | ICD-10-CM | POA: Diagnosis not present

## 2021-05-19 DIAGNOSIS — J3081 Allergic rhinitis due to animal (cat) (dog) hair and dander: Secondary | ICD-10-CM | POA: Diagnosis not present

## 2021-05-19 DIAGNOSIS — J301 Allergic rhinitis due to pollen: Secondary | ICD-10-CM | POA: Diagnosis not present

## 2021-05-19 DIAGNOSIS — J3089 Other allergic rhinitis: Secondary | ICD-10-CM | POA: Diagnosis not present

## 2021-05-28 DIAGNOSIS — J301 Allergic rhinitis due to pollen: Secondary | ICD-10-CM | POA: Diagnosis not present

## 2021-05-28 DIAGNOSIS — J3089 Other allergic rhinitis: Secondary | ICD-10-CM | POA: Diagnosis not present

## 2021-05-28 DIAGNOSIS — J3081 Allergic rhinitis due to animal (cat) (dog) hair and dander: Secondary | ICD-10-CM | POA: Diagnosis not present

## 2021-06-09 DIAGNOSIS — J3081 Allergic rhinitis due to animal (cat) (dog) hair and dander: Secondary | ICD-10-CM | POA: Diagnosis not present

## 2021-06-09 DIAGNOSIS — J301 Allergic rhinitis due to pollen: Secondary | ICD-10-CM | POA: Diagnosis not present

## 2021-06-09 DIAGNOSIS — J3089 Other allergic rhinitis: Secondary | ICD-10-CM | POA: Diagnosis not present

## 2021-06-16 ENCOUNTER — Telehealth: Payer: Self-pay | Admitting: Family Medicine

## 2021-06-16 DIAGNOSIS — J3081 Allergic rhinitis due to animal (cat) (dog) hair and dander: Secondary | ICD-10-CM | POA: Diagnosis not present

## 2021-06-16 DIAGNOSIS — J3089 Other allergic rhinitis: Secondary | ICD-10-CM | POA: Diagnosis not present

## 2021-06-16 DIAGNOSIS — J301 Allergic rhinitis due to pollen: Secondary | ICD-10-CM | POA: Diagnosis not present

## 2021-06-16 DIAGNOSIS — I81 Portal vein thrombosis: Secondary | ICD-10-CM

## 2021-06-16 MED ORDER — APIXABAN 5 MG PO TABS: 5.0000 mg | ORAL_TABLET | Freq: Two times a day (BID) | ORAL | 3 refills | Status: DC

## 2021-06-16 NOTE — Addendum Note (Signed)
Addended by: Sherrilee Gilles B on: 06/16/2021 04:31 PM ? ? Modules accepted: Orders ? ?

## 2021-06-16 NOTE — Telephone Encounter (Signed)
Encourage patient to contact the pharmacy for refills or they can request refills through [facility] ? ?Did the patient contact the pharmacy:  No ? ?LAST APPOINTMENT DATE:  04/28/2021  ? ?NEXT APPOINTMENT DATE: Is not sure  ? ?MEDICATION:  apixaban (ELIQUIS) 5 MG TABS tablet ? ?Is the patient out of medication? Will run out Thursday morning ? ?If not, how much is left? N/A ? ?Is this a 90 day supply: No 30 day supply ? ?[facility]AC[facility] [address], [place] [address] ? ?Let patient know to contact pharmacy at the end of the day to make sure medication is ready. ? ?Please notify patient to allow 48-72 hours to process ? ?

## 2021-06-16 NOTE — Telephone Encounter (Signed)
Erx sent

## 2021-06-16 NOTE — Telephone Encounter (Signed)
Pt called stating he has a blood clot and is wondering what to do next. Lab, Follow-Up? He is in need of a medication refill, that note is in his chart. He would like to speak about this/medication refill.  ?

## 2021-06-16 NOTE — Telephone Encounter (Signed)
Spoke with patient as this note needed clarification. Patient does not have a current blood clot but was asking about his past blood clot issue and being on eliquis. Patient wants to clarify he does not need further blood work done anytime soon or f/u?  ?

## 2021-06-17 MED ORDER — APIXABAN 5 MG PO TABS: 5.0000 mg | ORAL_TABLET | Freq: Two times a day (BID) | ORAL | 0 refills | Status: DC

## 2021-06-17 NOTE — Telephone Encounter (Signed)
Patient has not seen GI yet; he tried to get in to see someone but he stated no one would see him for this. He said his workers Radiographer, therapeutic was working on trying to get him seen but never could. Patient did not know who all refused to see him when I asked. He said he was trying to get all of this done with the workers comp claim but from what I took from the conversation I had with the patient is it sounds like they will not cover this part of the claim. He just kept saying they wouldn't see him and then in the same sentence said they don't see patients for this as a workers comp claim.  ?

## 2021-06-17 NOTE — Addendum Note (Signed)
Addended by: Joaquim Nam on: 06/17/2021 10:36 AM ? ? Modules accepted: Orders ? ?

## 2021-06-17 NOTE — Telephone Encounter (Signed)
Please check with patient.  The previous guidance was  3 months of anticoagulation and f/u with GI to repeat liver imaging and decide on final duration of therapy.  Has he seen GI?  Is he scheduled to see GI?  Let me know if he needs referral.  If he has seen GI then I would like to see that consult note.  I do not see it in the EMR currently. ? ?I think it makes sense to continue anticoagulation until he is seen in the GI clinic and they provide some additional guidance going forward.  This is assuming he is not bleeding or having other trouble.  I sent a refill in the meantime, in case he needs that to get through to the GI appointment.  Please let me know about the above.  Thanks. ?

## 2021-06-18 NOTE — Telephone Encounter (Signed)
I think it makes sense for him to try to see the GI clinic and I put in a referral to try to help with that.  If he cannot get that scheduled then have him let us know.  Thanks. ?

## 2021-06-18 NOTE — Addendum Note (Signed)
Addended by: Joaquim Nam on: 06/18/2021 10:35 AM ? ? Modules accepted: Orders ? ?

## 2021-06-18 NOTE — Telephone Encounter (Signed)
Called patient and notified referral was done. Advised patient if he can not get scheduled to let us know. ?

## 2021-06-24 DIAGNOSIS — J301 Allergic rhinitis due to pollen: Secondary | ICD-10-CM | POA: Diagnosis not present

## 2021-06-24 DIAGNOSIS — J3089 Other allergic rhinitis: Secondary | ICD-10-CM | POA: Diagnosis not present

## 2021-06-24 DIAGNOSIS — J3081 Allergic rhinitis due to animal (cat) (dog) hair and dander: Secondary | ICD-10-CM | POA: Diagnosis not present

## 2021-07-08 DIAGNOSIS — J3089 Other allergic rhinitis: Secondary | ICD-10-CM | POA: Diagnosis not present

## 2021-07-08 DIAGNOSIS — J301 Allergic rhinitis due to pollen: Secondary | ICD-10-CM | POA: Diagnosis not present

## 2021-07-08 DIAGNOSIS — J3081 Allergic rhinitis due to animal (cat) (dog) hair and dander: Secondary | ICD-10-CM | POA: Diagnosis not present

## 2021-07-13 ENCOUNTER — Encounter: Payer: Self-pay | Admitting: Gastroenterology

## 2021-07-13 MED ORDER — APIXABAN 5 MG PO TABS: 5.0000 mg | ORAL_TABLET | Freq: Two times a day (BID) | ORAL | 1 refills | Status: DC

## 2021-07-13 NOTE — Telephone Encounter (Signed)
Called patient and advised patient that GI called him on 06/18/21 to set up appt and left message on VM. Patient given number to call GI back and set up appt. Advised patient he is to stay on the medication until instructed otherwise after seeing GI. Refill sent.  ?

## 2021-07-13 NOTE — Telephone Encounter (Signed)
Pt called and stated he has not heard anything back from the GI clinic about his referral. He also wants to speak with the nurse to see if "he needs to get another medication refill or if he needs to wait until he gets his ultrasound." Please advise ? ?Callback Number: [phone]  ?

## 2021-07-13 NOTE — Addendum Note (Signed)
Addended by: Wendie Simmer B on: 07/13/2021 11:03 AM ? ? Modules accepted: Orders ? ?

## 2021-07-14 DIAGNOSIS — J3089 Other allergic rhinitis: Secondary | ICD-10-CM | POA: Diagnosis not present

## 2021-07-14 DIAGNOSIS — J301 Allergic rhinitis due to pollen: Secondary | ICD-10-CM | POA: Diagnosis not present

## 2021-07-14 DIAGNOSIS — J3081 Allergic rhinitis due to animal (cat) (dog) hair and dander: Secondary | ICD-10-CM | POA: Diagnosis not present

## 2021-07-16 DIAGNOSIS — J301 Allergic rhinitis due to pollen: Secondary | ICD-10-CM | POA: Diagnosis not present

## 2021-07-16 DIAGNOSIS — J3081 Allergic rhinitis due to animal (cat) (dog) hair and dander: Secondary | ICD-10-CM | POA: Diagnosis not present

## 2021-07-17 DIAGNOSIS — J3089 Other allergic rhinitis: Secondary | ICD-10-CM | POA: Diagnosis not present

## 2021-07-21 DIAGNOSIS — J301 Allergic rhinitis due to pollen: Secondary | ICD-10-CM | POA: Diagnosis not present

## 2021-07-21 DIAGNOSIS — J3081 Allergic rhinitis due to animal (cat) (dog) hair and dander: Secondary | ICD-10-CM | POA: Diagnosis not present

## 2021-07-21 DIAGNOSIS — J3089 Other allergic rhinitis: Secondary | ICD-10-CM | POA: Diagnosis not present

## 2021-07-30 DIAGNOSIS — J3081 Allergic rhinitis due to animal (cat) (dog) hair and dander: Secondary | ICD-10-CM | POA: Diagnosis not present

## 2021-07-30 DIAGNOSIS — J3089 Other allergic rhinitis: Secondary | ICD-10-CM | POA: Diagnosis not present

## 2021-07-30 DIAGNOSIS — J301 Allergic rhinitis due to pollen: Secondary | ICD-10-CM | POA: Diagnosis not present

## 2021-08-04 ENCOUNTER — Telehealth: Payer: Self-pay

## 2021-08-04 ENCOUNTER — Encounter: Payer: Self-pay | Admitting: Gastroenterology

## 2021-08-04 ENCOUNTER — Ambulatory Visit (INDEPENDENT_AMBULATORY_CARE_PROVIDER_SITE_OTHER): Payer: No Typology Code available for payment source | Admitting: Gastroenterology

## 2021-08-04 VITALS — BP 100/80 | HR 85 | Ht 72.0 in | Wt 239.0 lb

## 2021-08-04 DIAGNOSIS — I81 Portal vein thrombosis: Secondary | ICD-10-CM | POA: Diagnosis not present

## 2021-08-04 DIAGNOSIS — N200 Calculus of kidney: Secondary | ICD-10-CM | POA: Insufficient documentation

## 2021-08-04 NOTE — Telephone Encounter (Signed)
Referral sent to Dr Leonides Schanz with hematology. Will await appointment information.

## 2021-08-04 NOTE — Patient Instructions (Signed)
If you are age 39 or older, your body mass index should be between 23-30. Your Body mass index is 32.41 kg/m. If this is out of the aforementioned range listed, please consider follow up with your Primary Care Provider.  If you are age 59 or younger, your body mass index should be between 19-25. Your Body mass index is 32.41 kg/m. If this is out of the aformentioned range listed, please consider follow up with your Primary Care Provider.   ________________________________________________________  The [place] [facility] providers would like to encourage you to use [facility] to communicate with providers for non-urgent requests or questions.  Due to long hold times on the telephone, sending your provider a message by [facility] may be a faster and more efficient way to get a response.  Please allow 48 business hours for a response.  Please remember that this is for non-urgent requests.  _______________________________________________________ We have sent a referral to Dr J.Dorsey with hematology. They will reach out to you to schedule. [address] [place], [place], [place] [address] [phone]    You have been scheduled for an abdominal ultrasound at [facility] (1st floor of hospital) on 08-17-2021 at 10am. Please arrive 15 minutes prior to your appointment for registration. Make certain not to have anything to eat or drink 6 hours prior to your appointment. Should you need to reschedule your appointment, please contact radiology at [phone]. This test typically takes about 30 minutes to perform.   Due to recent changes in healthcare laws, you may see the results of your imaging and laboratory studies on MyChart before your provider has had a chance to review them.  We understand that in some cases there may be results that are confusing or concerning to you. Not all laboratory results come back in the same time frame and the provider may be waiting for multiple results in order to interpret others.   Please give [place] 48 hours in order for your provider to thoroughly review all the results before contacting the office for clarification of your results.    It was a pleasure to see you today!  Thank you for trusting me with your gastrointestinal care!
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
## 2021-08-05 ENCOUNTER — Telehealth: Payer: Self-pay | Admitting: Hematology and Oncology

## 2021-08-05 NOTE — Telephone Encounter (Signed)
Scheduled appt per 6/6 referral. Pt is aware of appt date and time. Pt is aware to arrive 15 mins prior to appt time and to bring and updated insurance card. Pt is aware of appt location.   

## 2021-08-06 DIAGNOSIS — J3081 Allergic rhinitis due to animal (cat) (dog) hair and dander: Secondary | ICD-10-CM | POA: Diagnosis not present

## 2021-08-06 DIAGNOSIS — J301 Allergic rhinitis due to pollen: Secondary | ICD-10-CM | POA: Diagnosis not present

## 2021-08-06 DIAGNOSIS — J3089 Other allergic rhinitis: Secondary | ICD-10-CM | POA: Diagnosis not present

## 2021-08-06 NOTE — Telephone Encounter (Signed)
Patient scheduled with Dr Leonides Schanz 08/20/21.

## 2021-08-12 DIAGNOSIS — J301 Allergic rhinitis due to pollen: Secondary | ICD-10-CM | POA: Diagnosis not present

## 2021-08-12 DIAGNOSIS — J3089 Other allergic rhinitis: Secondary | ICD-10-CM | POA: Diagnosis not present

## 2021-08-12 DIAGNOSIS — J3081 Allergic rhinitis due to animal (cat) (dog) hair and dander: Secondary | ICD-10-CM | POA: Diagnosis not present

## 2021-08-17 ENCOUNTER — Ambulatory Visit (HOSPITAL_COMMUNITY)
Admission: RE | Admit: 2021-08-17 | Discharge: 2021-08-17 | Disposition: A | Discharge status: Home / Self Care | Payer: No Typology Code available for payment source | Source: Ambulatory Visit | Attending: Gastroenterology | Admitting: Gastroenterology

## 2021-08-17 DIAGNOSIS — R161 Splenomegaly, not elsewhere classified: Secondary | ICD-10-CM | POA: Diagnosis not present

## 2021-08-17 DIAGNOSIS — I81 Portal vein thrombosis: Secondary | ICD-10-CM | POA: Diagnosis not present

## 2021-08-17 DIAGNOSIS — R188 Other ascites: Secondary | ICD-10-CM | POA: Diagnosis not present

## 2021-08-17 DIAGNOSIS — I82890 Acute embolism and thrombosis of other specified veins: Secondary | ICD-10-CM | POA: Diagnosis not present

## 2021-08-20 ENCOUNTER — Other Ambulatory Visit: Payer: Self-pay

## 2021-08-20 ENCOUNTER — Inpatient Hospital Stay: Payer: No Typology Code available for payment source

## 2021-08-20 ENCOUNTER — Inpatient Hospital Stay
Payer: No Typology Code available for payment source | Attending: Hematology and Oncology | Admitting: Hematology and Oncology

## 2021-08-20 VITALS — BP 118/91 | HR 97 | Temp 97.7°F | Resp 16 | Ht 72.0 in | Wt 238.9 lb

## 2021-08-20 DIAGNOSIS — Z7901 Long term (current) use of anticoagulants: Secondary | ICD-10-CM | POA: Diagnosis not present

## 2021-08-20 DIAGNOSIS — I81 Portal vein thrombosis: Secondary | ICD-10-CM

## 2021-08-20 DIAGNOSIS — J3089 Other allergic rhinitis: Secondary | ICD-10-CM | POA: Diagnosis not present

## 2021-08-20 DIAGNOSIS — Z808 Family history of malignant neoplasm of other organs or systems: Secondary | ICD-10-CM | POA: Diagnosis not present

## 2021-08-20 DIAGNOSIS — J301 Allergic rhinitis due to pollen: Secondary | ICD-10-CM | POA: Diagnosis not present

## 2021-08-20 DIAGNOSIS — Z803 Family history of malignant neoplasm of breast: Secondary | ICD-10-CM | POA: Diagnosis not present

## 2021-08-20 DIAGNOSIS — Z801 Family history of malignant neoplasm of trachea, bronchus and lung: Secondary | ICD-10-CM | POA: Insufficient documentation

## 2021-08-20 DIAGNOSIS — J3081 Allergic rhinitis due to animal (cat) (dog) hair and dander: Secondary | ICD-10-CM | POA: Diagnosis not present

## 2021-08-20 LAB — CBC WITH DIFFERENTIAL (CANCER CENTER ONLY)
Abs Immature Granulocytes: 0.02 10*3/uL (ref 0.00–0.07)
Basophils Absolute: 0.1 10*3/uL (ref 0.0–0.1)
Basophils Relative: 1 %
Eosinophils Absolute: 0 10*3/uL (ref 0.0–0.5)
Eosinophils Relative: 0 %
HCT: 45.6 % (ref 39.0–52.0)
Hemoglobin: 15.7 g/dL (ref 13.0–17.0)
Immature Granulocytes: 0 %
Lymphocytes Relative: 26 %
Lymphs Abs: 2.3 10*3/uL (ref 0.7–4.0)
MCH: 29.5 pg (ref 26.0–34.0)
MCHC: 34.4 g/dL (ref 30.0–36.0)
MCV: 85.6 fL (ref 80.0–100.0)
Monocytes Absolute: 0.7 10*3/uL (ref 0.1–1.0)
Monocytes Relative: 8 %
Neutro Abs: 5.8 10*3/uL (ref 1.7–7.7)
Neutrophils Relative %: 65 %
Platelet Count: 216 10*3/uL (ref 150–400)
RBC: 5.33 MIL/uL (ref 4.22–5.81)
RDW: 13.4 % (ref 11.5–15.5)
WBC Count: 9 10*3/uL (ref 4.0–10.5)
nRBC: 0 % (ref 0.0–0.2)

## 2021-08-20 LAB — CMP (CANCER CENTER ONLY)
ALT: 23 U/L (ref 0–44)
AST: 16 U/L (ref 15–41)
Albumin: 4.5 g/dL (ref 3.5–5.0)
Alkaline Phosphatase: 67 U/L (ref 38–126)
Anion gap: 6 (ref 5–15)
BUN: 11 mg/dL (ref 6–20)
CO2: 27 mmol/L (ref 22–32)
Calcium: 9.6 mg/dL (ref 8.9–10.3)
Chloride: 106 mmol/L (ref 98–111)
Creatinine: 1.03 mg/dL (ref 0.61–1.24)
GFR, Estimated: >60 mL/min (ref >60)
Glucose, Bld: 113 mg/dL — ABNORMAL HIGH (ref 70–99)
Potassium: 3.8 mmol/L (ref 3.5–5.1)
Sodium: 139 mmol/L (ref 135–145)
Total Bilirubin: 0.5 mg/dL (ref 0.3–1.2)
Total Protein: 7.6 g/dL (ref 6.5–8.1)

## 2021-08-21 LAB — CARDIOLIPIN ANTIBODIES, IGG, IGM, IGA
Anticardiolipin IgA: <9 APL U/mL (ref 0–11)
Anticardiolipin IgG: <9 GPL U/mL (ref 0–14)
Anticardiolipin IgM: <9 MPL U/mL (ref 0–12)

## 2021-08-21 LAB — PROTEIN S PANEL
Protein S Activity: 95 % (ref 63–140)
Protein S Ag, Free: 121 % (ref 61–136)
Protein S Ag, Total: 117 % (ref 60–150)

## 2021-08-21 LAB — DRVVT CONFIRM: dRVVT Confirm: 1.1 ratio (ref 0.8–1.2)

## 2021-08-21 LAB — LUPUS ANTICOAGULANT PANEL
DRVVT: 50.7 s — ABNORMAL HIGH (ref 0.0–47.0)
PTT Lupus Anticoagulant: 38.8 s (ref 0.0–43.5)

## 2021-08-21 LAB — DRVVT MIX: dRVVT Mix: 43 s — ABNORMAL HIGH (ref 0.0–40.4)

## 2021-08-21 LAB — BETA-2-GLYCOPROTEIN I ABS, IGG/M/A
Beta-2 Glyco I IgG: <9 GPI IgG units (ref 0–20)
Beta-2-Glycoprotein I IgA: <9 GPI IgA units (ref 0–25)
Beta-2-Glycoprotein I IgM: <9 GPI IgM units (ref 0–32)

## 2021-08-21 LAB — PROTEIN C, TOTAL: Protein C, Total: 113 % (ref 60–150)

## 2021-08-25 DIAGNOSIS — J3089 Other allergic rhinitis: Secondary | ICD-10-CM | POA: Diagnosis not present

## 2021-08-25 DIAGNOSIS — J301 Allergic rhinitis due to pollen: Secondary | ICD-10-CM | POA: Diagnosis not present

## 2021-08-25 DIAGNOSIS — J3081 Allergic rhinitis due to animal (cat) (dog) hair and dander: Secondary | ICD-10-CM | POA: Diagnosis not present

## 2021-08-25 LAB — PNH PROFILE (-HIGH SENSITIVITY)

## 2021-08-28 DIAGNOSIS — J3081 Allergic rhinitis due to animal (cat) (dog) hair and dander: Secondary | ICD-10-CM | POA: Diagnosis not present

## 2021-08-28 DIAGNOSIS — J301 Allergic rhinitis due to pollen: Secondary | ICD-10-CM | POA: Diagnosis not present

## 2021-08-28 DIAGNOSIS — J3089 Other allergic rhinitis: Secondary | ICD-10-CM | POA: Diagnosis not present

## 2021-09-02 DIAGNOSIS — J3081 Allergic rhinitis due to animal (cat) (dog) hair and dander: Secondary | ICD-10-CM | POA: Diagnosis not present

## 2021-09-02 DIAGNOSIS — J301 Allergic rhinitis due to pollen: Secondary | ICD-10-CM | POA: Diagnosis not present

## 2021-09-02 DIAGNOSIS — J3089 Other allergic rhinitis: Secondary | ICD-10-CM | POA: Diagnosis not present

## 2021-09-03 LAB — JAK2 (INCLUDING V617F AND EXON 12), MPL,& CALR W/RFL MPN PANEL (NGS)

## 2021-09-07 ENCOUNTER — Telehealth: Payer: Self-pay | Admitting: *Deleted

## 2021-09-07 NOTE — Telephone Encounter (Signed)
Called patient and explained lab values and Dr Derek Mound report to the patient.  Sent scheduling message for visit in January 2024.  Patient denies having any further questions at this time.

## 2021-09-17 DIAGNOSIS — J3081 Allergic rhinitis due to animal (cat) (dog) hair and dander: Secondary | ICD-10-CM | POA: Diagnosis not present

## 2021-09-17 DIAGNOSIS — J3089 Other allergic rhinitis: Secondary | ICD-10-CM | POA: Diagnosis not present

## 2021-09-17 DIAGNOSIS — J301 Allergic rhinitis due to pollen: Secondary | ICD-10-CM | POA: Diagnosis not present

## 2021-09-23 ENCOUNTER — Telehealth: Payer: Self-pay | Admitting: Hematology and Oncology

## 2021-09-23 NOTE — Telephone Encounter (Signed)
.  Called patient to schedule appointment per 06/20 inbasket, patient is aware of date and time.

## 2021-09-24 DIAGNOSIS — J3081 Allergic rhinitis due to animal (cat) (dog) hair and dander: Secondary | ICD-10-CM | POA: Diagnosis not present

## 2021-09-24 DIAGNOSIS — J301 Allergic rhinitis due to pollen: Secondary | ICD-10-CM | POA: Diagnosis not present

## 2021-09-24 DIAGNOSIS — J3089 Other allergic rhinitis: Secondary | ICD-10-CM | POA: Diagnosis not present

## 2021-10-02 DIAGNOSIS — J301 Allergic rhinitis due to pollen: Secondary | ICD-10-CM | POA: Diagnosis not present

## 2021-10-02 DIAGNOSIS — J3089 Other allergic rhinitis: Secondary | ICD-10-CM | POA: Diagnosis not present

## 2021-10-02 DIAGNOSIS — J3081 Allergic rhinitis due to animal (cat) (dog) hair and dander: Secondary | ICD-10-CM | POA: Diagnosis not present

## 2021-10-08 DIAGNOSIS — J301 Allergic rhinitis due to pollen: Secondary | ICD-10-CM | POA: Diagnosis not present

## 2021-10-08 DIAGNOSIS — J3089 Other allergic rhinitis: Secondary | ICD-10-CM | POA: Diagnosis not present

## 2021-10-08 DIAGNOSIS — J3081 Allergic rhinitis due to animal (cat) (dog) hair and dander: Secondary | ICD-10-CM | POA: Diagnosis not present

## 2021-10-14 DIAGNOSIS — J3089 Other allergic rhinitis: Secondary | ICD-10-CM | POA: Diagnosis not present

## 2021-10-14 DIAGNOSIS — J301 Allergic rhinitis due to pollen: Secondary | ICD-10-CM | POA: Diagnosis not present

## 2021-10-14 DIAGNOSIS — J3081 Allergic rhinitis due to animal (cat) (dog) hair and dander: Secondary | ICD-10-CM | POA: Diagnosis not present

## 2021-10-21 DIAGNOSIS — J3081 Allergic rhinitis due to animal (cat) (dog) hair and dander: Secondary | ICD-10-CM | POA: Diagnosis not present

## 2021-10-21 DIAGNOSIS — J3089 Other allergic rhinitis: Secondary | ICD-10-CM | POA: Diagnosis not present

## 2021-10-21 DIAGNOSIS — J301 Allergic rhinitis due to pollen: Secondary | ICD-10-CM | POA: Diagnosis not present

## 2021-10-27 DIAGNOSIS — J3089 Other allergic rhinitis: Secondary | ICD-10-CM | POA: Diagnosis not present

## 2021-10-27 DIAGNOSIS — J301 Allergic rhinitis due to pollen: Secondary | ICD-10-CM | POA: Diagnosis not present

## 2021-10-27 DIAGNOSIS — J3081 Allergic rhinitis due to animal (cat) (dog) hair and dander: Secondary | ICD-10-CM | POA: Diagnosis not present

## 2021-11-04 DIAGNOSIS — J3089 Other allergic rhinitis: Secondary | ICD-10-CM | POA: Diagnosis not present

## 2021-11-04 DIAGNOSIS — J301 Allergic rhinitis due to pollen: Secondary | ICD-10-CM | POA: Diagnosis not present

## 2021-11-04 DIAGNOSIS — J3081 Allergic rhinitis due to animal (cat) (dog) hair and dander: Secondary | ICD-10-CM | POA: Diagnosis not present

## 2021-11-11 DIAGNOSIS — J3081 Allergic rhinitis due to animal (cat) (dog) hair and dander: Secondary | ICD-10-CM | POA: Diagnosis not present

## 2021-11-11 DIAGNOSIS — J301 Allergic rhinitis due to pollen: Secondary | ICD-10-CM | POA: Diagnosis not present

## 2021-11-11 DIAGNOSIS — J3089 Other allergic rhinitis: Secondary | ICD-10-CM | POA: Diagnosis not present

## 2021-11-17 DIAGNOSIS — J301 Allergic rhinitis due to pollen: Secondary | ICD-10-CM | POA: Diagnosis not present

## 2021-11-17 DIAGNOSIS — J3089 Other allergic rhinitis: Secondary | ICD-10-CM | POA: Diagnosis not present

## 2021-11-17 DIAGNOSIS — J3081 Allergic rhinitis due to animal (cat) (dog) hair and dander: Secondary | ICD-10-CM | POA: Diagnosis not present

## 2021-11-26 DIAGNOSIS — J301 Allergic rhinitis due to pollen: Secondary | ICD-10-CM | POA: Diagnosis not present

## 2021-11-26 DIAGNOSIS — J3081 Allergic rhinitis due to animal (cat) (dog) hair and dander: Secondary | ICD-10-CM | POA: Diagnosis not present

## 2021-11-26 DIAGNOSIS — J3089 Other allergic rhinitis: Secondary | ICD-10-CM | POA: Diagnosis not present

## 2021-12-02 DIAGNOSIS — J3081 Allergic rhinitis due to animal (cat) (dog) hair and dander: Secondary | ICD-10-CM | POA: Diagnosis not present

## 2021-12-02 DIAGNOSIS — J3089 Other allergic rhinitis: Secondary | ICD-10-CM | POA: Diagnosis not present

## 2021-12-02 DIAGNOSIS — J301 Allergic rhinitis due to pollen: Secondary | ICD-10-CM | POA: Diagnosis not present

## 2021-12-03 ENCOUNTER — Telehealth: Payer: Self-pay | Admitting: Family Medicine

## 2021-12-03 NOTE — Telephone Encounter (Signed)
Pt called stating he has been getting an ongoing refill of apixaban (ELIQUIS) 5 MG TABS tablet but somehow its stopped so he's asking what should he do or can we refill prescription for him?   Caller Name: Arif Amendola Call back phone #: [phone]  MEDICATION(S):  apixaban (ELIQUIS) 5 MG TABS tablet  Days of Med Remaining: 1  Has the patient contacted their pharmacy (YES/NO)? YES What did pharmacy advise?   Pharmacy stated pt needed to contact pcp for refill since it was ended.   [facility] [place]  ~~~Please advise patient/caregiver to allow 2-3 business days to process RX refills.

## 2021-12-04 ENCOUNTER — Other Ambulatory Visit: Payer: Self-pay | Admitting: Hematology and Oncology

## 2021-12-04 MED ORDER — APIXABAN 5 MG PO TABS: 5.0000 mg | ORAL_TABLET | Freq: Two times a day (BID) | ORAL | 3 refills | Status: DC

## 2021-12-04 NOTE — Telephone Encounter (Signed)
I need input from hematology about the duration of anticoagulation.  I can send the rx if needed but I need guidance from hematology first.  I routed this to Dr. Lorenso Courier in the meantime.  I thank all involved.

## 2021-12-06 NOTE — Telephone Encounter (Signed)
Noted. Thanks.  ================  Orson Slick, MD  You 2 days ago    Thank you for reaching out. [facility] [address].

## 2021-12-08 DIAGNOSIS — J3081 Allergic rhinitis due to animal (cat) (dog) hair and dander: Secondary | ICD-10-CM | POA: Diagnosis not present

## 2021-12-08 DIAGNOSIS — J301 Allergic rhinitis due to pollen: Secondary | ICD-10-CM | POA: Diagnosis not present

## 2021-12-09 DIAGNOSIS — J3089 Other allergic rhinitis: Secondary | ICD-10-CM | POA: Diagnosis not present

## 2021-12-16 DIAGNOSIS — J3089 Other allergic rhinitis: Secondary | ICD-10-CM | POA: Diagnosis not present

## 2021-12-16 DIAGNOSIS — J301 Allergic rhinitis due to pollen: Secondary | ICD-10-CM | POA: Diagnosis not present

## 2021-12-16 DIAGNOSIS — J3081 Allergic rhinitis due to animal (cat) (dog) hair and dander: Secondary | ICD-10-CM | POA: Diagnosis not present

## 2021-12-25 DIAGNOSIS — J3089 Other allergic rhinitis: Secondary | ICD-10-CM | POA: Diagnosis not present

## 2021-12-25 DIAGNOSIS — J3081 Allergic rhinitis due to animal (cat) (dog) hair and dander: Secondary | ICD-10-CM | POA: Diagnosis not present

## 2021-12-25 DIAGNOSIS — J301 Allergic rhinitis due to pollen: Secondary | ICD-10-CM | POA: Diagnosis not present

## 2022-01-01 DIAGNOSIS — J3089 Other allergic rhinitis: Secondary | ICD-10-CM | POA: Diagnosis not present

## 2022-01-01 DIAGNOSIS — Z23 Encounter for immunization: Secondary | ICD-10-CM | POA: Diagnosis not present

## 2022-01-01 DIAGNOSIS — J3081 Allergic rhinitis due to animal (cat) (dog) hair and dander: Secondary | ICD-10-CM | POA: Diagnosis not present

## 2022-01-01 DIAGNOSIS — J301 Allergic rhinitis due to pollen: Secondary | ICD-10-CM | POA: Diagnosis not present

## 2022-01-07 ENCOUNTER — Telehealth: Payer: Self-pay | Admitting: Family Medicine

## 2022-01-07 NOTE — Telephone Encounter (Signed)
Please check with patient.  He should have refills on file at  [id number] [place], [place] - [address] [place] [address] Phone: [phone]  Fax: [phone]   It was prev sent by Dr. Leonides Schanz.  He should be able to get the refill there or transfer it if needed.    I called and LMOVM for patient re: the above.  Thanks.

## 2022-01-07 NOTE — Telephone Encounter (Signed)
  Encourage patient to contact the pharmacy for refills or they can request refills through [facility]  Did the patient contact the pharmacy:  no   LAST APPOINTMENT DATE:  Please schedule appointment if longer than 1 year 03/27/2021 NEXT APPOINTMENT DATE:  MEDICATION:apixaban (ELIQUIS) 5 MG TABS tablet   Is the patient out of medication? no  If not, how much is left?1 pill left  Is this a 90 day supply: no  [facility]Y: [id number] [place], [place] - [address] [facility] Phone: [phone]  Fax: [phone]      Let patient know to contact pharmacy at the end of the day to make sure medication is ready.  Please notify patient to allow 48-72 hours to process

## 2022-01-08 DIAGNOSIS — J3081 Allergic rhinitis due to animal (cat) (dog) hair and dander: Secondary | ICD-10-CM | POA: Diagnosis not present

## 2022-01-08 DIAGNOSIS — J3089 Other allergic rhinitis: Secondary | ICD-10-CM | POA: Diagnosis not present

## 2022-01-08 DIAGNOSIS — J301 Allergic rhinitis due to pollen: Secondary | ICD-10-CM | POA: Diagnosis not present

## 2022-01-08 NOTE — Telephone Encounter (Signed)
Spoke with patient and [facility].

## 2022-01-19 DIAGNOSIS — J3081 Allergic rhinitis due to animal (cat) (dog) hair and dander: Secondary | ICD-10-CM | POA: Diagnosis not present

## 2022-01-19 DIAGNOSIS — J301 Allergic rhinitis due to pollen: Secondary | ICD-10-CM | POA: Diagnosis not present

## 2022-01-19 DIAGNOSIS — J3089 Other allergic rhinitis: Secondary | ICD-10-CM | POA: Diagnosis not present

## 2022-01-26 DIAGNOSIS — J3081 Allergic rhinitis due to animal (cat) (dog) hair and dander: Secondary | ICD-10-CM | POA: Diagnosis not present

## 2022-01-26 DIAGNOSIS — J3089 Other allergic rhinitis: Secondary | ICD-10-CM | POA: Diagnosis not present

## 2022-01-26 DIAGNOSIS — J301 Allergic rhinitis due to pollen: Secondary | ICD-10-CM | POA: Diagnosis not present

## 2022-02-08 ENCOUNTER — Telehealth: Payer: Self-pay | Admitting: Hematology and Oncology

## 2022-02-08 NOTE — Telephone Encounter (Signed)
Called patient to r/s January appointment. Patient notified.

## 2022-02-11 DIAGNOSIS — J3081 Allergic rhinitis due to animal (cat) (dog) hair and dander: Secondary | ICD-10-CM | POA: Diagnosis not present

## 2022-02-11 DIAGNOSIS — J301 Allergic rhinitis due to pollen: Secondary | ICD-10-CM | POA: Diagnosis not present

## 2022-02-11 DIAGNOSIS — J3089 Other allergic rhinitis: Secondary | ICD-10-CM | POA: Diagnosis not present

## 2022-03-05 DIAGNOSIS — J301 Allergic rhinitis due to pollen: Secondary | ICD-10-CM | POA: Diagnosis not present

## 2022-03-05 DIAGNOSIS — J3089 Other allergic rhinitis: Secondary | ICD-10-CM | POA: Diagnosis not present

## 2022-03-05 DIAGNOSIS — J3081 Allergic rhinitis due to animal (cat) (dog) hair and dander: Secondary | ICD-10-CM | POA: Diagnosis not present

## 2022-03-11 ENCOUNTER — Inpatient Hospital Stay (HOSPITAL_BASED_OUTPATIENT_CLINIC_OR_DEPARTMENT_OTHER): Payer: BC Managed Care – PPO | Admitting: Hematology and Oncology

## 2022-03-11 ENCOUNTER — Other Ambulatory Visit: Payer: Self-pay

## 2022-03-11 ENCOUNTER — Other Ambulatory Visit: Payer: Self-pay | Admitting: Hematology and Oncology

## 2022-03-11 ENCOUNTER — Inpatient Hospital Stay: Payer: BC Managed Care – PPO | Attending: Hematology and Oncology

## 2022-03-11 VITALS — BP 122/88 | Temp 98.1°F | Resp 20 | Wt 241.9 lb

## 2022-03-11 DIAGNOSIS — J3089 Other allergic rhinitis: Secondary | ICD-10-CM | POA: Diagnosis not present

## 2022-03-11 DIAGNOSIS — I81 Portal vein thrombosis: Secondary | ICD-10-CM

## 2022-03-11 DIAGNOSIS — Z7901 Long term (current) use of anticoagulants: Secondary | ICD-10-CM | POA: Insufficient documentation

## 2022-03-11 DIAGNOSIS — J3081 Allergic rhinitis due to animal (cat) (dog) hair and dander: Secondary | ICD-10-CM | POA: Diagnosis not present

## 2022-03-11 DIAGNOSIS — D649 Anemia, unspecified: Secondary | ICD-10-CM | POA: Insufficient documentation

## 2022-03-11 DIAGNOSIS — J301 Allergic rhinitis due to pollen: Secondary | ICD-10-CM | POA: Diagnosis not present

## 2022-03-11 LAB — CBC WITH DIFFERENTIAL (CANCER CENTER ONLY)
Abs Immature Granulocytes: 0.03 10*3/uL (ref 0.00–0.07)
Basophils Absolute: 0 10*3/uL (ref 0.0–0.1)
Basophils Relative: 0 %
Eosinophils Absolute: 0 10*3/uL (ref 0.0–0.5)
Eosinophils Relative: 0 %
HCT: 27 % — ABNORMAL LOW (ref 39.0–52.0)
Hemoglobin: 8.8 g/dL — ABNORMAL LOW (ref 13.0–17.0)
Immature Granulocytes: 0 %
Lymphocytes Relative: 24 %
Lymphs Abs: 2.3 10*3/uL (ref 0.7–4.0)
MCH: 26.9 pg (ref 26.0–34.0)
MCHC: 32.6 g/dL (ref 30.0–36.0)
MCV: 82.6 fL (ref 80.0–100.0)
Monocytes Absolute: 0.7 10*3/uL (ref 0.1–1.0)
Monocytes Relative: 7 %
Neutro Abs: 6.6 10*3/uL (ref 1.7–7.7)
Neutrophils Relative %: 69 %
Platelet Count: 336 10*3/uL (ref 150–400)
RBC: 3.27 MIL/uL — ABNORMAL LOW (ref 4.22–5.81)
RDW: 14.4 % (ref 11.5–15.5)
WBC Count: 9.7 10*3/uL (ref 4.0–10.5)
nRBC: 0 % (ref 0.0–0.2)

## 2022-03-11 LAB — CMP (CANCER CENTER ONLY)
ALT: 18 U/L (ref 0–44)
AST: 17 U/L (ref 15–41)
Albumin: 4.3 g/dL (ref 3.5–5.0)
Alkaline Phosphatase: 64 U/L (ref 38–126)
Anion gap: 6 (ref 5–15)
BUN: 11 mg/dL (ref 6–20)
CO2: 28 mmol/L (ref 22–32)
Calcium: 9.3 mg/dL (ref 8.9–10.3)
Chloride: 107 mmol/L (ref 98–111)
Creatinine: 1.12 mg/dL (ref 0.61–1.24)
GFR, Estimated: >60 mL/min (ref >60)
Glucose, Bld: 100 mg/dL — ABNORMAL HIGH (ref 70–99)
Potassium: 3.9 mmol/L (ref 3.5–5.1)
Sodium: 141 mmol/L (ref 135–145)
Total Bilirubin: 0.4 mg/dL (ref 0.3–1.2)
Total Protein: 7.1 g/dL (ref 6.5–8.1)
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
## 2022-03-12 ENCOUNTER — Other Ambulatory Visit: Payer: Self-pay

## 2022-03-12 DIAGNOSIS — D649 Anemia, unspecified: Secondary | ICD-10-CM

## 2022-03-12 DIAGNOSIS — I81 Portal vein thrombosis: Secondary | ICD-10-CM

## 2022-03-13 ENCOUNTER — Telehealth: Payer: Self-pay | Admitting: Family Medicine

## 2022-03-13 NOTE — Telephone Encounter (Signed)
Please check with patient to make sure he has follow-up with GI.  If he has trouble getting this set then please let me know.  Thanks.

## 2022-03-15 ENCOUNTER — Telehealth: Payer: Self-pay

## 2022-03-15 ENCOUNTER — Other Ambulatory Visit (INDEPENDENT_AMBULATORY_CARE_PROVIDER_SITE_OTHER): Payer: BC Managed Care – PPO

## 2022-03-15 DIAGNOSIS — I81 Portal vein thrombosis: Secondary | ICD-10-CM | POA: Diagnosis not present

## 2022-03-15 DIAGNOSIS — D649 Anemia, unspecified: Secondary | ICD-10-CM | POA: Diagnosis not present

## 2022-03-15 LAB — IBC + FERRITIN
Ferritin: 8.3 ng/mL — ABNORMAL LOW (ref 22.0–322.0)
Iron: 12 ug/dL — ABNORMAL LOW (ref 42–165)
Saturation Ratios: 2.8 % — ABNORMAL LOW (ref 20.0–50.0)
TIBC: 425.6 ug/dL (ref 250.0–450.0)
Transferrin: 304 mg/dL (ref 212.0–360.0)

## 2022-03-15 LAB — VITAMIN B12: Vitamin B-12: 450 pg/mL (ref 211–911)

## 2022-03-15 LAB — FOLATE: Folate: 12.5 ng/mL (ref >5.9)

## 2022-03-15 NOTE — Telephone Encounter (Signed)
Noted. Will await GI input.  Thanks.

## 2022-03-15 NOTE — Telephone Encounter (Signed)
-----  Message from Yevette Edwards, RN sent at 03/12/2022 10:19 AM EST ----- Regarding: Labs IBC+Ferritin, B12, and Folate - orders are in epic

## 2022-03-15 NOTE — Telephone Encounter (Signed)
Pt has an appt with Gi on 03/16/2022 @ 3:40 pm.

## 2022-03-16 ENCOUNTER — Ambulatory Visit: Payer: BC Managed Care – PPO | Admitting: Gastroenterology

## 2022-03-16 ENCOUNTER — Encounter: Payer: Self-pay | Admitting: Gastroenterology

## 2022-03-16 VITALS — BP 126/76 | HR 96 | Ht 72.0 in | Wt 240.0 lb

## 2022-03-16 DIAGNOSIS — J3089 Other allergic rhinitis: Secondary | ICD-10-CM | POA: Diagnosis not present

## 2022-03-16 DIAGNOSIS — D509 Iron deficiency anemia, unspecified: Secondary | ICD-10-CM

## 2022-03-16 DIAGNOSIS — Z7902 Long term (current) use of antithrombotics/antiplatelets: Secondary | ICD-10-CM | POA: Diagnosis not present

## 2022-03-16 DIAGNOSIS — J301 Allergic rhinitis due to pollen: Secondary | ICD-10-CM | POA: Diagnosis not present

## 2022-03-16 DIAGNOSIS — J3081 Allergic rhinitis due to animal (cat) (dog) hair and dander: Secondary | ICD-10-CM | POA: Diagnosis not present

## 2022-03-16 MED ORDER — NA SULFATE-K SULFATE-MG SULF 17.5-3.13-1.6 GM/177ML PO SOLN: 1.0000 | Freq: Once | ORAL | 0 refills | Status: AC

## 2022-03-16 NOTE — Patient Instructions (Addendum)
_______________________________________________________  If your blood pressure at your visit was 140/90 or greater, please contact your primary care physician to follow up on this.  _______________________________________________________  If you are age 40 or older, your body mass index should be between 23-30. Your Body mass index is 32.55 kg/m. If this is out of the aforementioned range listed, please consider follow up with your Primary Care Provider.  If you are age 52 or younger, your body mass index should be between 19-25. Your Body mass index is 32.55 kg/m. If this is out of the aformentioned range listed, please consider follow up with your Primary Care Provider.   ________________________________________________________  The [facility] providers would like to encourage you to use [facility] to communicate with providers for non-urgent requests or questions.  Due to long hold times on the telephone, sending your provider a message by [facility] may be a faster and more efficient way to get a response.  Please allow 48 business hours for a response.  Please remember that this is for non-urgent requests.  _______________________________________________________  Nathan Rowland have been scheduled for an endoscopy and colonoscopy. Please follow the written instructions given to you at your visit today. Please pick up your prep supplies at the pharmacy within the next 1-3 days. If you use inhalers (even only as needed), please bring them with you on the day of your procedure.  Start over the counter Iron tablet 65mg , once a day.  It was a pleasure to see you today!  Thank you for trusting me with your gastrointestinal care!
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
## 2022-03-16 NOTE — Telephone Encounter (Signed)
Patient came for labs yesterday. Will be reviewed at appt later today.

## 2022-03-16 NOTE — Progress Notes (Signed)
Todd,    There may be a problem with the prep prescription.  Please contact the pharmacy to investigate and remedy.  Thanks  - HD

## 2022-03-24 ENCOUNTER — Encounter: Payer: Self-pay | Admitting: Certified Registered Nurse Anesthetist

## 2022-03-24 ENCOUNTER — Encounter: Payer: Self-pay | Admitting: Gastroenterology

## 2022-03-25 ENCOUNTER — Ambulatory Visit: Payer: BC Managed Care – PPO | Admitting: Hematology and Oncology

## 2022-03-25 ENCOUNTER — Other Ambulatory Visit: Payer: BC Managed Care – PPO

## 2022-03-26 DIAGNOSIS — J3089 Other allergic rhinitis: Secondary | ICD-10-CM | POA: Diagnosis not present

## 2022-03-26 DIAGNOSIS — J3081 Allergic rhinitis due to animal (cat) (dog) hair and dander: Secondary | ICD-10-CM | POA: Diagnosis not present

## 2022-03-26 DIAGNOSIS — J301 Allergic rhinitis due to pollen: Secondary | ICD-10-CM | POA: Diagnosis not present

## 2022-03-31 ENCOUNTER — Encounter: Payer: Self-pay | Admitting: Gastroenterology

## 2022-03-31 ENCOUNTER — Ambulatory Visit (AMBULATORY_SURGERY_CENTER): Payer: BC Managed Care – PPO | Admitting: Gastroenterology

## 2022-03-31 VITALS — BP 105/69 | HR 78 | Temp 97.9°F | Resp 23 | Ht 72.0 in | Wt 240.0 lb

## 2022-03-31 DIAGNOSIS — K766 Portal hypertension: Secondary | ICD-10-CM | POA: Diagnosis not present

## 2022-03-31 DIAGNOSIS — D509 Iron deficiency anemia, unspecified: Secondary | ICD-10-CM | POA: Diagnosis not present

## 2022-03-31 DIAGNOSIS — I8500 Esophageal varices without bleeding: Secondary | ICD-10-CM | POA: Diagnosis not present

## 2022-03-31 DIAGNOSIS — K319 Disease of stomach and duodenum, unspecified: Secondary | ICD-10-CM

## 2022-03-31 MED ORDER — OMEPRAZOLE 40 MG PO CPDR: 40.0000 mg | DELAYED_RELEASE_CAPSULE | Freq: Every day | ORAL | 3 refills | Status: DC

## 2022-03-31 MED ORDER — SODIUM CHLORIDE 0.9 % IV SOLN: 500.0000 mL | Freq: Once | INTRAVENOUS | Status: DC

## 2022-03-31 NOTE — Progress Notes (Signed)
No changes to clinical history since GI office visit on 03/16/22.  The patient is appropriate for an endoscopic procedure in the ambulatory setting.  - Wilfrid Lund, MD

## 2022-03-31 NOTE — Patient Instructions (Addendum)
Recommendation: - Patient has a contact number available for                            emergencies. The signs and symptoms of potential                            delayed complications were discussed with the                            patient. Return to normal activities tomorrow.                            Written discharge instructions were provided to the                            patient.                           - Resume previous diet.                           - Resume Eliquis (apixaban) at prior dose today.                           - Omeprazole 40 mg once daily. Disp #30, RF 3                           - CT angiogram abdomen and pelvis to evaluate                            portal vein thrombosis and portal hypertension.                            This will help determine if radiologic intervention                            for the varices is feasible.                           -Follow-up with hematology consultant. Report will                            be forwarded.                                                      YOU HAD AN ENDOSCOPIC PROCEDURE TODAY AT [facility]:   Refer to the procedure report that was given to you for any specific questions about what was found during the examination.  If the procedure report does not answer your questions, please call your gastroenterologist to clarify.  If you requested that your care partner not be given the details of your procedure findings, then the procedure report has been included in a sealed envelope for you to review  at your convenience later.  YOU SHOULD EXPECT: Some feelings of bloating in the abdomen. Passage of more gas than usual.  Walking can help get rid of the air that was put into your GI tract during the procedure and reduce the bloating. If you had a lower endoscopy (such as a colonoscopy or flexible sigmoidoscopy) you may notice spotting of blood in your stool or on the toilet paper. If you underwent  a bowel prep for your procedure, you may not have a normal bowel movement for a few days.  Please Note:  You might notice some irritation and congestion in your nose or some drainage.  This is from the oxygen used during your procedure.  There is no need for concern and it should clear up in a day or so.  SYMPTOMS TO REPORT IMMEDIATELY:  Following lower endoscopy (colonoscopy or flexible sigmoidoscopy):  Excessive amounts of blood in the stool  Significant tenderness or worsening of abdominal pains  Swelling of the abdomen that is new, acute  Fever of 100F or higher  Following upper endoscopy (EGD)  Vomiting of blood or coffee ground material  New chest pain or pain under the shoulder blades  Painful or persistently difficult swallowing  New shortness of breath  Black, tarry-looking stools  For urgent or emergent issues, a gastroenterologist can be reached at any hour by calling [phone]. Do not use MyChart messaging for urgent concerns.   DIET:  We do recommend a small meal at first, but then you may proceed to your regular diet.  Drink plenty of fluids but you should avoid alcoholic beverages for 24 hours.  ACTIVITY:  You should plan to take it easy for the rest of today and you should NOT DRIVE or use heavy machinery until tomorrow (because of the sedation medicines used during the test).    FOLLOW UP: Our staff will call the number listed on your records the next business day following your procedure.  We will call around 7:15- 8:00 am to check on you and address any questions or concerns that you may have regarding the information given to you following your procedure. If we do not reach you, we will leave a message.     If any biopsies were taken you will be contacted by phone or by letter within the next 1-3 weeks.  Please call us at [phone] if you have not heard about the biopsies in 3 weeks.   SIGNATURES/CONFIDENTIALITY: You and/or your care partner have signed  paperwork which will be entered into your electronic medical record.  These signatures attest to the fact that that the information above on your After Visit Summary has been reviewed and is understood.  Full responsibility of the confidentiality of this discharge information lies with you and/or your care-partner.

## 2022-03-31 NOTE — Progress Notes (Signed)
Report given to PACU, vss 

## 2022-03-31 NOTE — Op Note (Signed)
[facility] Patient Name: Nathan Rowland Procedure Date: 03/31/2022 3:23 PM MRN: [id number] Endoscopist: Mallie Mussel L. Loletha Carrow , MD, [id number] Age: 42 Referring MD:  Date of Birth: 04-01-1980 Gender: Male Account #: [id number] Procedure:                Colonoscopy Indications:              Unexplained iron deficiency anemia Medicines:                Monitored Anesthesia Care Procedure:                Pre-Anesthesia Assessment:                           - Prior to the procedure, a History and Physical                            was performed, and patient medications and                            allergies were reviewed. The patient's tolerance of                            previous anesthesia was also reviewed. The risks                            and benefits of the procedure and the sedation                            options and risks were discussed with the patient.                            All questions were answered, and informed consent                            was obtained. Prior Anticoagulants: The patient has                            taken Eliquis (apixaban), last dose was 2 days                            prior to procedure. ASA Grade Assessment: II - A                            patient with mild systemic disease. After reviewing                            the risks and benefits, the patient was deemed in                            satisfactory condition to undergo the procedure.                           After obtaining informed consent, the colonoscope  was passed under direct vision. Throughout the                            procedure, the patient's blood pressure, pulse, and                            oxygen saturations were monitored continuously. The                            Olympus CF-HQ190L SN [id number] was introduced through                            the anus and advanced to the the terminal ileum,                             with identification of the appendiceal orifice and                            IC valve. The colonoscopy was performed without                            difficulty. The patient tolerated the procedure                            well. The quality of the bowel preparation was                            excellent. The terminal ileum, ileocecal valve,                            appendiceal orifice, and rectum were photographed.                            The bowel preparation used was SUPREP via split                            dose instruction. Scope In: 3:31:43 PM Scope Out: 3:40:35 PM Scope Withdrawal Time: 0 hours 6 minutes 44 seconds  Total Procedure Duration: 0 hours 8 minutes 52 seconds  Findings:                 The perianal and digital rectal examinations were                            normal.                           The terminal ileum appeared normal.                           The entire examined colon appeared normal on direct                            and retroflexion views. Complications:            No immediate  complications. Estimated Blood Loss:     Estimated blood loss: none. Impression:               - The examined portion of the ileum was normal.                           - The entire examined colon is normal on direct and                            retroflexion views.                           - No specimens collected. Recommendation:           - Patient has a contact number available for                            emergencies. The signs and symptoms of potential                            delayed complications were discussed with the                            patient. Return to normal activities tomorrow.                            Written discharge instructions were provided to the                            patient.                           - Resume previous diet.                           - Continue present medications.                           - Repeat  colonoscopy in 10 years for screening                            purposes.                           - See the other procedure note for documentation of                            additional recommendations. Nathan Rowland L. Loletha Carrow, MD 03/31/2022 3:43:54 PM This report has been signed electronically.

## 2022-03-31 NOTE — Progress Notes (Signed)
1523 Robinul 0.1 mg IV given due large amount of secretions upon assessment.  MD made aware, vss  

## 2022-03-31 NOTE — Op Note (Signed)
Nathan Rowland: Nathan Rowland Procedure Date: 03/31/2022 3:23 PM MRN: [id number] Endoscopist: Mallie Mussel L. Loletha Carrow , MD, [id number] Age: 42 Referring MD:  Date of Birth: Jan 14, 1981 Gender: Male Account #: [id number] Procedure:                Upper GI endoscopy Indications:              Unexplained iron deficiency anemia                           See recent clinic note for details -few days of                            black stool 4 to 6 weeks ago.                           Portal vein thrombosis of unclear cause diagnosed a                            year ago, on [facility] since June 2023. Recent hemoglobin                            markedly decreased from June 2023 with low iron                            levels. No source of blood loss on colonoscopy                            today (see report) Medicines:                Monitored Anesthesia Care Procedure:                Pre-Anesthesia Assessment:                           - Prior to the procedure, a History and Physical                            was performed, and patient medications and                            allergies were reviewed. The patient's tolerance of                            previous anesthesia was also reviewed. The risks                            and benefits of the procedure and the sedation                            options and risks were discussed with the patient.                            All questions were answered, and informed consent  was obtained. Prior Anticoagulants: The patient has                            taken Eliquis (apixaban), last dose was 2 days                            prior to procedure. ASA Grade Assessment: II - A                            patient with mild systemic disease. After reviewing                            the risks and benefits, the patient was deemed in                            satisfactory condition to undergo the  procedure.                           After obtaining informed consent, the endoscope was                            passed under direct vision. Throughout the                            procedure, the patient's blood pressure, pulse, and                            oxygen saturations were monitored continuously. The                            Olympus scope [id number] was introduced through the                            mouth, and advanced to the second part of duodenum.                            The upper GI endoscopy was accomplished without                            difficulty. The patient tolerated the procedure                            well. Scope In: Scope Out: Findings:                 Grade III, large (> 5 mm) varices were found in the                            middle third of the esophagus and in the lower                            third of the esophagus.  Mild portal hypertensive gastropathy was found in                            the entire examined stomach.                           Type 1 gastroesophageal varices (GOV1, esophageal                            varices which extend along the lesser curvature)                            with no bleeding were found in the cardia. There                            were no stigmata of recent bleeding. Overlying                            mucosa inflamed (see photo). Few flecks of hematin                            in gastric fundus.                           The exam of the stomach was otherwise normal.                           The examined duodenum was normal. Complications:            No immediate complications. Estimated Blood Loss:     Estimated blood loss: none. Impression:               See GI and hematology clinic notes. Unknown if PVT                            was secondary to trauma, though hematology                            consultant believes that is unlikely. Unknown at                             present if these endoscopic findings of portal                            hypertension are the result of portal vein                            thrombosis or if the patient could have an                            underlying noncirrhotic portal hypertensive                            condition that led to the PVT.                           -  Grade III and large (> 5 mm) esophageal varices.                           - Portal hypertensive gastropathy.                           - Type 1 gastroesophageal varices (GOV1, esophageal                            varices which extend along the lesser curvature),                            without bleeding.                           - Normal examined duodenum.                           - No specimens collected. Recommendation:           - Patient has a contact number available for                            emergencies. The signs and symptoms of potential                            delayed complications were discussed with the                            patient. Return to normal activities tomorrow.                            Written discharge instructions were provided to the                            patient.                           - Resume previous diet.                           - Resume Eliquis (apixaban) at prior dose today.                           - Omeprazole 40 mg once daily. Disp #30, RF 3                           - CT angiogram abdomen and pelvis to evaluate                            portal vein thrombosis and portal hypertension.                            This will help determine if radiologic intervention  for the varices is feasible.                           -Follow-up with hematology consultant. Report will                            be forwarded. [name] L. Myrtie Neither, MD 03/31/2022 4:09:41 PM This report has been signed electronically.

## 2022-04-01 ENCOUNTER — Telehealth: Payer: Self-pay

## 2022-04-01 DIAGNOSIS — I81 Portal vein thrombosis: Secondary | ICD-10-CM

## 2022-04-01 NOTE — Telephone Encounter (Signed)
Patient has been scheduled for CT angio on Monday, 04/05/22 at 8 am.

## 2022-04-01 NOTE — Telephone Encounter (Signed)
CT angiogram order in epic. Urgent secure staff message sent to radiology scheduling to contact patient ASAP to schedule appt.

## 2022-04-01 NOTE — Telephone Encounter (Signed)
  Follow up Call-     03/31/2022    2:15 PM  Call back number  Post procedure Call Back phone  # [phone]  Permission to leave phone message Yes     Patient questions:  Do you have a fever, pain , or abdominal swelling? No. Pain Score  0 *  Have you tolerated food without any problems? Yes.    Have you been able to return to your normal activities? Yes.    Do you have any questions about your discharge instructions: Diet   No. Medications  No. Follow up visit  No.  Do you have questions or concerns about your Care? No.  Actions: * If pain score is 4 or above: No action needed, pain <4.

## 2022-04-01 NOTE — Telephone Encounter (Signed)
Please order a CT angiogram abdomen and pelvis for this patient (see today's EGD report). He is 42 years old and had a recent BUN and creatinine, so he does not need additional lab work before the scan. I would like to get this done within 2 weeks, please. There is some urgency to it since I discovered esophageal and gastric varices and he has a history of portal vein thrombosis.

## 2022-04-05 ENCOUNTER — Ambulatory Visit (HOSPITAL_COMMUNITY): Payer: BC Managed Care – PPO

## 2022-04-06 DIAGNOSIS — J3081 Allergic rhinitis due to animal (cat) (dog) hair and dander: Secondary | ICD-10-CM | POA: Diagnosis not present

## 2022-04-06 DIAGNOSIS — J3089 Other allergic rhinitis: Secondary | ICD-10-CM | POA: Diagnosis not present

## 2022-04-06 DIAGNOSIS — J301 Allergic rhinitis due to pollen: Secondary | ICD-10-CM | POA: Diagnosis not present

## 2022-04-13 DIAGNOSIS — J301 Allergic rhinitis due to pollen: Secondary | ICD-10-CM | POA: Diagnosis not present

## 2022-04-13 DIAGNOSIS — J3089 Other allergic rhinitis: Secondary | ICD-10-CM | POA: Diagnosis not present

## 2022-04-13 DIAGNOSIS — J3081 Allergic rhinitis due to animal (cat) (dog) hair and dander: Secondary | ICD-10-CM | POA: Diagnosis not present

## 2022-04-16 ENCOUNTER — Telehealth: Payer: Self-pay | Admitting: Hematology and Oncology

## 2022-04-16 ENCOUNTER — Other Ambulatory Visit: Payer: Self-pay

## 2022-04-16 DIAGNOSIS — J301 Allergic rhinitis due to pollen: Secondary | ICD-10-CM | POA: Diagnosis not present

## 2022-04-16 DIAGNOSIS — J3081 Allergic rhinitis due to animal (cat) (dog) hair and dander: Secondary | ICD-10-CM | POA: Diagnosis not present

## 2022-04-16 DIAGNOSIS — H1045 Other chronic allergic conjunctivitis: Secondary | ICD-10-CM | POA: Diagnosis not present

## 2022-04-16 DIAGNOSIS — J3089 Other allergic rhinitis: Secondary | ICD-10-CM | POA: Diagnosis not present

## 2022-04-16 DIAGNOSIS — I81 Portal vein thrombosis: Secondary | ICD-10-CM

## 2022-04-16 NOTE — Telephone Encounter (Signed)
Per 2/16 IB Scheduled patient for Labs only, patient aware and confirmed.

## 2022-04-21 ENCOUNTER — Other Ambulatory Visit: Payer: Self-pay | Admitting: *Deleted

## 2022-04-21 ENCOUNTER — Other Ambulatory Visit: Payer: Self-pay

## 2022-04-21 ENCOUNTER — Other Ambulatory Visit: Payer: Self-pay | Admitting: Hematology and Oncology

## 2022-04-21 ENCOUNTER — Inpatient Hospital Stay: Payer: BC Managed Care – PPO | Attending: Hematology and Oncology

## 2022-04-21 DIAGNOSIS — J3081 Allergic rhinitis due to animal (cat) (dog) hair and dander: Secondary | ICD-10-CM | POA: Diagnosis not present

## 2022-04-21 DIAGNOSIS — J3089 Other allergic rhinitis: Secondary | ICD-10-CM | POA: Diagnosis not present

## 2022-04-21 DIAGNOSIS — I81 Portal vein thrombosis: Secondary | ICD-10-CM

## 2022-04-21 DIAGNOSIS — D649 Anemia, unspecified: Secondary | ICD-10-CM | POA: Diagnosis not present

## 2022-04-21 DIAGNOSIS — J301 Allergic rhinitis due to pollen: Secondary | ICD-10-CM | POA: Diagnosis not present

## 2022-04-21 LAB — CBC WITH DIFFERENTIAL (CANCER CENTER ONLY)
Abs Immature Granulocytes: 0.02 10*3/uL (ref 0.00–0.07)
Basophils Absolute: 0 10*3/uL (ref 0.0–0.1)
Basophils Relative: 0 %
Eosinophils Absolute: 0.1 10*3/uL (ref 0.0–0.5)
Eosinophils Relative: 1 %
HCT: 40 % (ref 39.0–52.0)
Hemoglobin: 12.7 g/dL — ABNORMAL LOW (ref 13.0–17.0)
Immature Granulocytes: 0 %
Lymphocytes Relative: 12 %
Lymphs Abs: 0.8 10*3/uL (ref 0.7–4.0)
MCH: 26.3 pg (ref 26.0–34.0)
MCHC: 31.8 g/dL (ref 30.0–36.0)
MCV: 82.8 fL (ref 80.0–100.0)
Monocytes Absolute: 0.7 10*3/uL (ref 0.1–1.0)
Monocytes Relative: 10 %
Neutro Abs: 5.4 10*3/uL (ref 1.7–7.7)
Neutrophils Relative %: 77 %
Platelet Count: 211 10*3/uL (ref 150–400)
RBC: 4.83 MIL/uL (ref 4.22–5.81)
RDW: 19.6 % — ABNORMAL HIGH (ref 11.5–15.5)
WBC Count: 7 10*3/uL (ref 4.0–10.5)
nRBC: 0 % (ref 0.0–0.2)

## 2022-04-21 LAB — IRON AND IRON BINDING CAPACITY (CC-WL,HP ONLY)
Iron: 25 ug/dL — ABNORMAL LOW (ref 45–182)
Saturation Ratios: 6 % — ABNORMAL LOW (ref 17.9–39.5)
TIBC: 392 ug/dL (ref 250–450)
UIBC: 367 ug/dL

## 2022-04-21 LAB — FERRITIN: Ferritin: 42 ng/mL (ref 24–336)

## 2022-04-27 DIAGNOSIS — J301 Allergic rhinitis due to pollen: Secondary | ICD-10-CM | POA: Diagnosis not present

## 2022-04-27 DIAGNOSIS — J3081 Allergic rhinitis due to animal (cat) (dog) hair and dander: Secondary | ICD-10-CM | POA: Diagnosis not present

## 2022-04-27 DIAGNOSIS — J3089 Other allergic rhinitis: Secondary | ICD-10-CM | POA: Diagnosis not present

## 2022-05-03 ENCOUNTER — Ambulatory Visit: Payer: BC Managed Care – PPO | Admitting: Family Medicine

## 2022-05-03 ENCOUNTER — Encounter: Payer: Self-pay | Admitting: Family Medicine

## 2022-05-03 VITALS — BP 128/72 | HR 87 | Temp 97.9°F | Ht 72.0 in | Wt 244.0 lb

## 2022-05-03 DIAGNOSIS — I81 Portal vein thrombosis: Secondary | ICD-10-CM

## 2022-05-03 DIAGNOSIS — B079 Viral wart, unspecified: Secondary | ICD-10-CM | POA: Diagnosis not present

## 2022-05-03 MED ORDER — IRON (FERROUS SULFATE) 325 (65 FE) MG PO TABS: 325.0000 mg | ORAL_TABLET | Freq: Every day | ORAL | Status: DC

## 2022-05-03 NOTE — Patient Instructions (Addendum)
I think it makes sense to get the CT done to see if that changes the plan about varices/treatment.   Take care.  Glad to see you.

## 2022-05-03 NOTE — Progress Notes (Unsigned)
D/w pt about prev thrombosis.  Discussed about eliquis use, given his job.  Still on iron, recent labs d/w pt.  Prev hypercoag panel neg per hematology report. H/o varices on EGD.  CT pending.  Rationale for CT, to evaluate the liver, d/w pt.  No bleeding currently.    No black stools (stools are only slightly darker than normal).  He also has multiple warts on the fingers.  Discussed options.  Meds, vitals, and allergies reviewed.   ROS: Per HPI unless specifically indicated in ROS section   GEN: nad, alert and oriented HEENT: ncat NECK: supple w/o LA CV: rrr.  PULM: ctab, no inc wob ABD: soft, +bs EXT: no edema SKIN: no acute rash Multiple warts on the fingers, 11 warts in total across both hands.  He opted for treatment, he consented for cryotherapy after discussing options.  Each lesion was frozen x 3 with liquid nitrogen with adequate freeze/thaw cycle.  Tolerated well.  No complications.  Routine post cryotherapy instructions given to patient.  30 minutes were devoted to patient care in this encounter (this includes time spent reviewing the patient's file/history, interviewing and examining the patient, counseling/reviewing plan with patient).

## 2022-05-04 ENCOUNTER — Telehealth: Payer: Self-pay | Admitting: Family Medicine

## 2022-05-04 ENCOUNTER — Encounter: Payer: Self-pay | Admitting: Gastroenterology

## 2022-05-04 NOTE — Telephone Encounter (Signed)
Per 3/5 IB reached out to patient to schedule , left voicemail.

## 2022-05-06 DIAGNOSIS — B079 Viral wart, unspecified: Secondary | ICD-10-CM | POA: Insufficient documentation

## 2022-05-06 NOTE — Assessment & Plan Note (Signed)
Discussed options.  I think it makes sense to get his CT done before he does anything else.  We can consider when to repeat labs after CT is done on 05/07/22.   I think it makes sense to continue Eliquis in the meantime.  He would like to taper off of that if it would be safe to do so, especially given his job.

## 2022-05-06 NOTE — Assessment & Plan Note (Signed)
Frozen as above, tolerated well.  Routine instructions given to patient.  He is aware that he may need multiple treatments.  Update [place] as needed.

## 2022-05-07 ENCOUNTER — Ambulatory Visit
Admission: RE | Admit: 2022-05-07 | Discharge: 2022-05-07 | Disposition: A | Discharge status: Home / Self Care | Payer: BC Managed Care – PPO | Source: Ambulatory Visit | Attending: Gastroenterology | Admitting: Gastroenterology

## 2022-05-07 DIAGNOSIS — J301 Allergic rhinitis due to pollen: Secondary | ICD-10-CM | POA: Diagnosis not present

## 2022-05-07 DIAGNOSIS — I8500 Esophageal varices without bleeding: Secondary | ICD-10-CM | POA: Diagnosis not present

## 2022-05-07 DIAGNOSIS — K766 Portal hypertension: Secondary | ICD-10-CM | POA: Diagnosis not present

## 2022-05-07 DIAGNOSIS — I81 Portal vein thrombosis: Secondary | ICD-10-CM | POA: Diagnosis not present

## 2022-05-07 DIAGNOSIS — K55069 Acute infarction of intestine, part and extent unspecified: Secondary | ICD-10-CM | POA: Diagnosis not present

## 2022-05-07 DIAGNOSIS — J3089 Other allergic rhinitis: Secondary | ICD-10-CM | POA: Diagnosis not present

## 2022-05-07 DIAGNOSIS — J3081 Allergic rhinitis due to animal (cat) (dog) hair and dander: Secondary | ICD-10-CM | POA: Diagnosis not present

## 2022-05-07 MED ORDER — IOPAMIDOL (ISOVUE-370) INJECTION 76%
75.0000 mL | Freq: Once | INTRAVENOUS | Status: AC | PRN
  Administered 2022-05-07: 75 mL via INTRAVENOUS

## 2022-05-12 DIAGNOSIS — J3089 Other allergic rhinitis: Secondary | ICD-10-CM | POA: Diagnosis not present

## 2022-05-12 DIAGNOSIS — J3081 Allergic rhinitis due to animal (cat) (dog) hair and dander: Secondary | ICD-10-CM | POA: Diagnosis not present

## 2022-05-12 DIAGNOSIS — J301 Allergic rhinitis due to pollen: Secondary | ICD-10-CM | POA: Diagnosis not present

## 2022-05-19 ENCOUNTER — Telehealth: Payer: Self-pay | Admitting: Gastroenterology

## 2022-05-19 DIAGNOSIS — J301 Allergic rhinitis due to pollen: Secondary | ICD-10-CM | POA: Diagnosis not present

## 2022-05-19 DIAGNOSIS — J3081 Allergic rhinitis due to animal (cat) (dog) hair and dander: Secondary | ICD-10-CM | POA: Diagnosis not present

## 2022-05-19 DIAGNOSIS — J3089 Other allergic rhinitis: Secondary | ICD-10-CM | POA: Diagnosis not present

## 2022-05-19 NOTE — Telephone Encounter (Signed)
[place],  After speaking with interventional radiology colleagues regarding this patient's recent abdominal CT angiogram, Nathan Rowland will be seen in the IR clinic for consideration of a procedure to address the findings on the scan.  Please send a referral to Dr.Dylan Suttle of the [facility] interventional radiology department.  I sent him a separate message with the patient info, and there clinic will reach out to the patient directly.  They just requested a formal referral to be sent.  - HD

## 2022-05-20 ENCOUNTER — Other Ambulatory Visit: Payer: Self-pay | Admitting: Gastroenterology

## 2022-05-20 DIAGNOSIS — K766 Portal hypertension: Secondary | ICD-10-CM

## 2022-05-20 NOTE — Telephone Encounter (Signed)
IR order was placed today in Epic. IR will contact patient to set up appt.

## 2022-05-26 ENCOUNTER — Encounter: Payer: Self-pay | Admitting: Internal Medicine

## 2022-05-26 ENCOUNTER — Ambulatory Visit: Payer: BC Managed Care – PPO | Admitting: Internal Medicine

## 2022-05-26 VITALS — BP 110/78 | HR 90 | Temp 97.7°F | Ht 72.0 in | Wt 240.0 lb

## 2022-05-26 DIAGNOSIS — S39012A Strain of muscle, fascia and tendon of lower back, initial encounter: Secondary | ICD-10-CM | POA: Diagnosis not present

## 2022-05-26 MED ORDER — CYCLOBENZAPRINE HCL 10 MG PO TABS: 5.0000 mg | ORAL_TABLET | Freq: Every evening | ORAL | 0 refills | Status: DC | PRN
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
## 2022-05-26 NOTE — Assessment & Plan Note (Signed)
Mid back Discussed that this is unlikely related to his portal vein thrombosis Continue heat, tylenol Will give some flexeril for bedtime

## 2022-06-02 ENCOUNTER — Ambulatory Visit
Admission: RE | Admit: 2022-06-02 | Discharge: 2022-06-02 | Disposition: A | Discharge status: Home / Self Care | Payer: BC Managed Care – PPO | Source: Ambulatory Visit | Attending: Gastroenterology | Admitting: Gastroenterology

## 2022-06-02 DIAGNOSIS — J3089 Other allergic rhinitis: Secondary | ICD-10-CM | POA: Diagnosis not present

## 2022-06-02 DIAGNOSIS — K766 Portal hypertension: Secondary | ICD-10-CM

## 2022-06-02 DIAGNOSIS — J301 Allergic rhinitis due to pollen: Secondary | ICD-10-CM | POA: Diagnosis not present

## 2022-06-02 DIAGNOSIS — J3081 Allergic rhinitis due to animal (cat) (dog) hair and dander: Secondary | ICD-10-CM | POA: Diagnosis not present

## 2022-06-02 HISTORY — PX: IR RADIOLOGIST EVAL & MGMT: IMG5224
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
## 2022-06-09 DIAGNOSIS — J301 Allergic rhinitis due to pollen: Secondary | ICD-10-CM | POA: Diagnosis not present

## 2022-06-09 DIAGNOSIS — J3089 Other allergic rhinitis: Secondary | ICD-10-CM | POA: Diagnosis not present

## 2022-06-09 DIAGNOSIS — J3081 Allergic rhinitis due to animal (cat) (dog) hair and dander: Secondary | ICD-10-CM | POA: Diagnosis not present

## 2022-06-16 DIAGNOSIS — J3081 Allergic rhinitis due to animal (cat) (dog) hair and dander: Secondary | ICD-10-CM | POA: Diagnosis not present

## 2022-06-16 DIAGNOSIS — J3089 Other allergic rhinitis: Secondary | ICD-10-CM | POA: Diagnosis not present

## 2022-06-16 DIAGNOSIS — J301 Allergic rhinitis due to pollen: Secondary | ICD-10-CM | POA: Diagnosis not present

## 2022-06-18 ENCOUNTER — Other Ambulatory Visit: Payer: Self-pay | Admitting: Internal Medicine

## 2022-06-23 DIAGNOSIS — J3081 Allergic rhinitis due to animal (cat) (dog) hair and dander: Secondary | ICD-10-CM | POA: Diagnosis not present

## 2022-06-23 DIAGNOSIS — J301 Allergic rhinitis due to pollen: Secondary | ICD-10-CM | POA: Diagnosis not present

## 2022-06-23 DIAGNOSIS — J3089 Other allergic rhinitis: Secondary | ICD-10-CM | POA: Diagnosis not present

## 2022-06-30 DIAGNOSIS — I2699 Other pulmonary embolism without acute cor pulmonale: Secondary | ICD-10-CM

## 2022-06-30 DIAGNOSIS — D62 Acute posthemorrhagic anemia: Secondary | ICD-10-CM

## 2022-06-30 HISTORY — DX: Other pulmonary embolism without acute cor pulmonale: I26.99

## 2022-06-30 HISTORY — DX: Acute posthemorrhagic anemia: D62

## 2022-07-01 DIAGNOSIS — J3089 Other allergic rhinitis: Secondary | ICD-10-CM | POA: Diagnosis not present

## 2022-07-01 DIAGNOSIS — J3081 Allergic rhinitis due to animal (cat) (dog) hair and dander: Secondary | ICD-10-CM | POA: Diagnosis not present

## 2022-07-01 DIAGNOSIS — J301 Allergic rhinitis due to pollen: Secondary | ICD-10-CM | POA: Diagnosis not present

## 2022-07-02 ENCOUNTER — Inpatient Hospital Stay: Payer: BC Managed Care – PPO

## 2022-07-02 ENCOUNTER — Other Ambulatory Visit: Payer: Self-pay | Admitting: Physician Assistant

## 2022-07-02 ENCOUNTER — Telehealth: Payer: Self-pay | Admitting: *Deleted

## 2022-07-02 ENCOUNTER — Other Ambulatory Visit: Payer: Self-pay | Admitting: *Deleted

## 2022-07-02 ENCOUNTER — Inpatient Hospital Stay: Payer: BC Managed Care – PPO | Attending: Hematology and Oncology

## 2022-07-02 DIAGNOSIS — I81 Portal vein thrombosis: Secondary | ICD-10-CM

## 2022-07-02 DIAGNOSIS — Z7901 Long term (current) use of anticoagulants: Secondary | ICD-10-CM | POA: Diagnosis not present

## 2022-07-02 DIAGNOSIS — D649 Anemia, unspecified: Secondary | ICD-10-CM | POA: Diagnosis not present

## 2022-07-02 LAB — CBC WITH DIFFERENTIAL (CANCER CENTER ONLY)
Abs Immature Granulocytes: 0.08 10*3/uL — ABNORMAL HIGH (ref 0.00–0.07)
Basophils Absolute: 0 10*3/uL (ref 0.0–0.1)
Basophils Relative: 0 %
Eosinophils Absolute: 0.1 10*3/uL (ref 0.0–0.5)
Eosinophils Relative: 1 %
HCT: 22.8 % — ABNORMAL LOW (ref 39.0–52.0)
Hemoglobin: 7.3 g/dL — ABNORMAL LOW (ref 13.0–17.0)
Immature Granulocytes: 1 %
Lymphocytes Relative: 23 %
Lymphs Abs: 2.6 10*3/uL (ref 0.7–4.0)
MCH: 28.3 pg (ref 26.0–34.0)
MCHC: 32 g/dL (ref 30.0–36.0)
MCV: 88.4 fL (ref 80.0–100.0)
Monocytes Absolute: 0.6 10*3/uL (ref 0.1–1.0)
Monocytes Relative: 5 %
Neutro Abs: 8 10*3/uL — ABNORMAL HIGH (ref 1.7–7.7)
Neutrophils Relative %: 70 %
Platelet Count: 248 10*3/uL (ref 150–400)
RBC: 2.58 MIL/uL — ABNORMAL LOW (ref 4.22–5.81)
RDW: 17 % — ABNORMAL HIGH (ref 11.5–15.5)
WBC Count: 11.4 10*3/uL — ABNORMAL HIGH (ref 4.0–10.5)
nRBC: 0.2 % (ref 0.0–0.2)

## 2022-07-02 LAB — ABO/RH: ABO/RH(D): A POS

## 2022-07-02 LAB — SAMPLE TO BLOOD BANK

## 2022-07-02 LAB — IRON AND IRON BINDING CAPACITY (CC-WL,HP ONLY)
Iron: 50 ug/dL (ref 45–182)
Saturation Ratios: 15 % — ABNORMAL LOW (ref 17.9–39.5)
TIBC: 335 ug/dL (ref 250–450)
UIBC: 285 ug/dL (ref 117–376)

## 2022-07-02 LAB — PREPARE RBC (CROSSMATCH)

## 2022-07-02 LAB — CMP (CANCER CENTER ONLY)
ALT: 17 U/L (ref 0–44)
AST: 16 U/L (ref 15–41)
Albumin: 3.7 g/dL (ref 3.5–5.0)
Alkaline Phosphatase: 49 U/L (ref 38–126)
Anion gap: 4 — ABNORMAL LOW (ref 5–15)
BUN: 21 mg/dL — ABNORMAL HIGH (ref 6–20)
CO2: 27 mmol/L (ref 22–32)
Calcium: 8.3 mg/dL — ABNORMAL LOW (ref 8.9–10.3)
Chloride: 109 mmol/L (ref 98–111)
Creatinine: 0.92 mg/dL (ref 0.61–1.24)
GFR, Estimated: >60 mL/min (ref >60)
Glucose, Bld: 111 mg/dL — ABNORMAL HIGH (ref 70–99)
Potassium: 3.9 mmol/L (ref 3.5–5.1)
Sodium: 140 mmol/L (ref 135–145)
Total Bilirubin: 0.2 mg/dL — ABNORMAL LOW (ref 0.3–1.2)
Total Protein: 5.8 g/dL — ABNORMAL LOW (ref 6.5–8.1)

## 2022-07-02 LAB — FERRITIN: Ferritin: 17 ng/mL — ABNORMAL LOW (ref 24–336)

## 2022-07-02 NOTE — Telephone Encounter (Signed)
Nathan Rowland states he had a BM this morning with minimal blood. Has not happened since Christmas. Also states he is slightly SOB and pulse is elevated. To come in at 1:30 today for labs. CBC and sample to blood bank ordered

## 2022-07-02 NOTE — Telephone Encounter (Signed)
Also ordered iron/TIBC and Ferritin. Pt to follow up with his GI as well. We will evaluate need for iron per Georga Kaufmann, PA

## 2022-07-03 ENCOUNTER — Inpatient Hospital Stay: Payer: BC Managed Care – PPO

## 2022-07-03 DIAGNOSIS — D649 Anemia, unspecified: Secondary | ICD-10-CM

## 2022-07-03 DIAGNOSIS — I81 Portal vein thrombosis: Secondary | ICD-10-CM | POA: Diagnosis not present

## 2022-07-03 DIAGNOSIS — Z7901 Long term (current) use of anticoagulants: Secondary | ICD-10-CM | POA: Diagnosis not present

## 2022-07-03 MED ORDER — SODIUM CHLORIDE 0.9% IV SOLUTION
250.0000 mL | Freq: Once | INTRAVENOUS | Status: AC
  Administered 2022-07-03: 250 mL via INTRAVENOUS

## 2022-07-03 MED ORDER — ACETAMINOPHEN 325 MG PO TABS
650.0000 mg | ORAL_TABLET | Freq: Once | ORAL | Status: AC
  Administered 2022-07-03: 650 mg via ORAL
  Filled 2022-07-03: qty 2

## 2022-07-03 NOTE — Patient Instructions (Signed)
Blood Transfusion, Adult, Care After The following information offers guidance on how to care for yourself after your procedure. Your health care provider may also give you more specific instructions. If you have problems or questions, contact your health care provider. What can I expect after the procedure? After the procedure, it is common to have: Bruising and soreness where the IV was inserted. A headache. Follow these instructions at home: IV insertion site care     Follow instructions from your health care provider about how to take care of your IV insertion site. Make sure you: Wash your hands with soap and water for at least 20 seconds before and after you change your bandage (dressing). If soap and water are not available, use hand sanitizer. Change your dressing as told by your health care provider. Check your IV insertion site every day for signs of infection. Check for: Redness, swelling, or pain. Bleeding from the site. Warmth. Pus or a bad smell. General instructions Take over-the-counter and prescription medicines only as told by your health care provider. Rest as told by your health care provider. Return to your normal activities as told by your health care provider. Keep all follow-up visits. Lab tests may need to be done at certain periods to recheck your blood counts. Contact a health care provider if: You have itching or red, swollen areas of skin (hives). You have a fever or chills. You have pain in the head, back, or chest. You feel anxious or you feel weak after doing your normal activities. You have redness, swelling, warmth, or pain around the IV insertion site. You have blood coming from the IV insertion site that does not stop with pressure. You have pus or a bad smell coming from your IV insertion site. If you received your blood transfusion in an outpatient setting, you will be told whom to contact to report any reactions. Get help right away if: You  have symptoms of a serious allergic or immune system reaction, including: Trouble breathing or shortness of breath. Swelling of the face, feeling flushed, or widespread rash. Dark urine or blood in the urine. Fast heartbeat. These symptoms may be an emergency. Get help right away. Call 911. Do not wait to see if the symptoms will go away. Do not drive yourself to the hospital. Summary Bruising and soreness around the IV insertion site are common. Check your IV insertion site every day for signs of infection. Rest as told by your health care provider. Return to your normal activities as told by your health care provider. Get help right away for symptoms of a serious allergic or immune system reaction to the blood transfusion. This information is not intended to replace advice given to you by your health care provider. Make sure you discuss any questions you have with your health care provider. Document Revised: 05/15/2021 Document Reviewed: 05/15/2021 Elsevier Patient Education  2023 Elsevier Inc.  

## 2022-07-05 ENCOUNTER — Telehealth: Payer: Self-pay | Admitting: *Deleted

## 2022-07-05 ENCOUNTER — Encounter: Payer: Self-pay | Admitting: Gastroenterology

## 2022-07-05 ENCOUNTER — Other Ambulatory Visit: Payer: Self-pay | Admitting: Physician Assistant

## 2022-07-05 ENCOUNTER — Telehealth: Payer: Self-pay | Admitting: Pharmacy Technician

## 2022-07-05 ENCOUNTER — Telehealth: Payer: Self-pay

## 2022-07-05 DIAGNOSIS — Z7902 Long term (current) use of antithrombotics/antiplatelets: Secondary | ICD-10-CM

## 2022-07-05 DIAGNOSIS — D509 Iron deficiency anemia, unspecified: Secondary | ICD-10-CM | POA: Insufficient documentation

## 2022-07-05 NOTE — Telephone Encounter (Signed)
Nathan Rowland is non preferred with [employer] and will be denied if patient has not failed venofer. Preferred med is venofer. Would you like to try venofer.  Thanks Selena Batten

## 2022-07-05 NOTE — Telephone Encounter (Signed)
-----   Message from Charlie Pitter III, MD sent at 07/05/2022  1:09 PM EDT ----- Thank you for the update.  I am still waiting to hear back if he plans to move forward with a particular procedure in interventional radiology to address his portal hypertension.  I previously sent Dr. Leonides Schanz some messages about this.  Since this is probably still iron deficiency anemia as before, he may have 1 or more distal small bowel sources of blood loss.  Whether or not that is related to his portal hypertension remains to be seen.  I will have my clinical staff reach out to him to arrange a small bowel video capsule study.   - Amada Jupiter  __________________  Sharol Harness,   Please see my portal message to this patient regarding recommendation for a small bowel video capsule study to evaluate iron deficiency anemia.  Contact him by phone and, if he is agreeable, please make the arrangements.  HD

## 2022-07-05 NOTE — Telephone Encounter (Signed)
TCT patient regarding his recent lab results.  Spoke with him. Advised that his iron levels are low. We will arrange of for IV iron to bolster levels. Georga Kaufmann  have requested a follow up with his gastroenterologist.  Pt voiced understanding.

## 2022-07-05 NOTE — Telephone Encounter (Signed)
-----   Message from Briant Cedar, PA-C sent at 07/05/2022 12:53 PM EDT ----- Please notify patient that iron levels are low. We will arrange of for IV iron to bolster levels. I have requested a follow up with his gastroenterologist.   Troy Sine  ----- Message ----- From: Leory Plowman, Lab In [place] Sent: 07/02/2022   2:03 PM EDT To: Briant Cedar, PA-C

## 2022-07-06 ENCOUNTER — Other Ambulatory Visit: Payer: Self-pay | Admitting: Pharmacy Technician

## 2022-07-06 LAB — TYPE AND SCREEN
ABO/RH(D): A POS
Antibody Screen: NEGATIVE
Unit division: 0

## 2022-07-06 LAB — BPAM RBC
Blood Product Expiration Date: 202405282359
ISSUE DATE / TIME: 202405041012
Unit Type and Rh: 6200

## 2022-07-07 ENCOUNTER — Encounter: Payer: Self-pay | Admitting: Physician Assistant

## 2022-07-07 NOTE — Telephone Encounter (Signed)
Lm on vm for patient to return call 

## 2022-07-07 NOTE — Telephone Encounter (Signed)
Pt returned call. We reviewed Dr. Myrtie Neither' recommendations. Pt has been scheduled for VCE on 07/15/22 at 8:30 am. Pt is aware that he will need to return to the office at 4 pm the day of his appt to return equipment. Pt has still been taking oral iron and his been advised to hold moving forward for his VCE appt. Pt confirmed that he has MyChart and is aware that I will send his VCE prep instructions there for his review. Pt verbalized understanding and had no concerns at the end of the call.  VCE order form completed. VCE instructions sent to patient via MyChart. Ambulatory referral to GI in epic, secure message sent to pre-certification team since appt is in 1 week.

## 2022-07-08 DIAGNOSIS — J3081 Allergic rhinitis due to animal (cat) (dog) hair and dander: Secondary | ICD-10-CM | POA: Diagnosis not present

## 2022-07-08 DIAGNOSIS — J301 Allergic rhinitis due to pollen: Secondary | ICD-10-CM | POA: Diagnosis not present

## 2022-07-08 DIAGNOSIS — J3089 Other allergic rhinitis: Secondary | ICD-10-CM | POA: Diagnosis not present

## 2022-07-11 ENCOUNTER — Other Ambulatory Visit: Payer: Self-pay | Admitting: Family Medicine

## 2022-07-11 ENCOUNTER — Other Ambulatory Visit: Payer: Self-pay

## 2022-07-11 ENCOUNTER — Inpatient Hospital Stay (HOSPITAL_COMMUNITY)
Admission: EM | Admit: 2022-07-11 | Discharge: 2022-07-25 | DRG: 405 | Disposition: A | Discharge status: Home / Self Care | Payer: BC Managed Care – PPO | Attending: Internal Medicine | Admitting: Internal Medicine

## 2022-07-11 ENCOUNTER — Encounter (HOSPITAL_COMMUNITY): Payer: Self-pay | Admitting: Pharmacy Technician

## 2022-07-11 DIAGNOSIS — J9601 Acute respiratory failure with hypoxia: Secondary | ICD-10-CM

## 2022-07-11 DIAGNOSIS — K921 Melena: Secondary | ICD-10-CM | POA: Diagnosis present

## 2022-07-11 DIAGNOSIS — I82890 Acute embolism and thrombosis of other specified veins: Secondary | ICD-10-CM | POA: Diagnosis present

## 2022-07-11 DIAGNOSIS — I8511 Secondary esophageal varices with bleeding: Secondary | ICD-10-CM | POA: Diagnosis present

## 2022-07-11 DIAGNOSIS — K7682 Hepatic encephalopathy: Secondary | ICD-10-CM | POA: Diagnosis not present

## 2022-07-11 DIAGNOSIS — N200 Calculus of kidney: Secondary | ICD-10-CM | POA: Diagnosis not present

## 2022-07-11 DIAGNOSIS — I8501 Esophageal varices with bleeding: Secondary | ICD-10-CM

## 2022-07-11 DIAGNOSIS — I2699 Other pulmonary embolism without acute cor pulmonale: Secondary | ICD-10-CM | POA: Diagnosis not present

## 2022-07-11 DIAGNOSIS — K7460 Unspecified cirrhosis of liver: Secondary | ICD-10-CM | POA: Diagnosis not present

## 2022-07-11 DIAGNOSIS — D62 Acute posthemorrhagic anemia: Secondary | ICD-10-CM | POA: Diagnosis not present

## 2022-07-11 DIAGNOSIS — K769 Liver disease, unspecified: Secondary | ICD-10-CM | POA: Diagnosis not present

## 2022-07-11 DIAGNOSIS — Z7901 Long term (current) use of anticoagulants: Secondary | ICD-10-CM | POA: Diagnosis not present

## 2022-07-11 DIAGNOSIS — Z6832 Body mass index (BMI) 32.0-32.9, adult: Secondary | ICD-10-CM | POA: Diagnosis not present

## 2022-07-11 DIAGNOSIS — I959 Hypotension, unspecified: Secondary | ICD-10-CM | POA: Diagnosis not present

## 2022-07-11 DIAGNOSIS — E669 Obesity, unspecified: Secondary | ICD-10-CM | POA: Diagnosis not present

## 2022-07-11 DIAGNOSIS — I8510 Secondary esophageal varices without bleeding: Secondary | ICD-10-CM | POA: Diagnosis not present

## 2022-07-11 DIAGNOSIS — K766 Portal hypertension: Secondary | ICD-10-CM | POA: Diagnosis present

## 2022-07-11 DIAGNOSIS — Z825 Family history of asthma and other chronic lower respiratory diseases: Secondary | ICD-10-CM | POA: Diagnosis not present

## 2022-07-11 DIAGNOSIS — K5900 Constipation, unspecified: Secondary | ICD-10-CM | POA: Diagnosis not present

## 2022-07-11 DIAGNOSIS — R0902 Hypoxemia: Secondary | ICD-10-CM | POA: Diagnosis not present

## 2022-07-11 DIAGNOSIS — I83899 Varicose veins of unspecified lower extremities with other complications: Secondary | ICD-10-CM | POA: Diagnosis present

## 2022-07-11 DIAGNOSIS — D509 Iron deficiency anemia, unspecified: Secondary | ICD-10-CM | POA: Diagnosis not present

## 2022-07-11 DIAGNOSIS — I81 Portal vein thrombosis: Secondary | ICD-10-CM | POA: Diagnosis present

## 2022-07-11 DIAGNOSIS — Z823 Family history of stroke: Secondary | ICD-10-CM | POA: Diagnosis not present

## 2022-07-11 DIAGNOSIS — Z87442 Personal history of urinary calculi: Secondary | ICD-10-CM

## 2022-07-11 DIAGNOSIS — K3189 Other diseases of stomach and duodenum: Secondary | ICD-10-CM | POA: Diagnosis present

## 2022-07-11 DIAGNOSIS — I8500 Esophageal varices without bleeding: Secondary | ICD-10-CM | POA: Diagnosis not present

## 2022-07-11 DIAGNOSIS — Z86718 Personal history of other venous thrombosis and embolism: Secondary | ICD-10-CM

## 2022-07-11 DIAGNOSIS — J9811 Atelectasis: Secondary | ICD-10-CM | POA: Diagnosis not present

## 2022-07-11 DIAGNOSIS — K922 Gastrointestinal hemorrhage, unspecified: Secondary | ICD-10-CM | POA: Diagnosis not present

## 2022-07-11 DIAGNOSIS — R0602 Shortness of breath: Secondary | ICD-10-CM | POA: Diagnosis not present

## 2022-07-11 DIAGNOSIS — R161 Splenomegaly, not elsewhere classified: Secondary | ICD-10-CM | POA: Diagnosis present

## 2022-07-11 DIAGNOSIS — R140 Abdominal distension (gaseous): Secondary | ICD-10-CM | POA: Diagnosis present

## 2022-07-11 DIAGNOSIS — Z8249 Family history of ischemic heart disease and other diseases of the circulatory system: Secondary | ICD-10-CM

## 2022-07-11 DIAGNOSIS — R000 Tachycardia, unspecified: Secondary | ICD-10-CM | POA: Diagnosis present

## 2022-07-11 DIAGNOSIS — R188 Other ascites: Secondary | ICD-10-CM | POA: Diagnosis present

## 2022-07-11 DIAGNOSIS — I864 Gastric varices: Secondary | ICD-10-CM | POA: Diagnosis not present

## 2022-07-11 DIAGNOSIS — Z833 Family history of diabetes mellitus: Secondary | ICD-10-CM

## 2022-07-11 DIAGNOSIS — D500 Iron deficiency anemia secondary to blood loss (chronic): Secondary | ICD-10-CM | POA: Diagnosis not present

## 2022-07-11 DIAGNOSIS — R0600 Dyspnea, unspecified: Secondary | ICD-10-CM | POA: Diagnosis not present

## 2022-07-11 DIAGNOSIS — D649 Anemia, unspecified: Secondary | ICD-10-CM | POA: Diagnosis not present

## 2022-07-11 DIAGNOSIS — J690 Pneumonitis due to inhalation of food and vomit: Secondary | ICD-10-CM | POA: Diagnosis not present

## 2022-07-11 LAB — COMPREHENSIVE METABOLIC PANEL
ALT: 20 U/L (ref 0–44)
AST: 19 U/L (ref 15–41)
Albumin: 2.9 g/dL — ABNORMAL LOW (ref 3.5–5.0)
Alkaline Phosphatase: 46 U/L (ref 38–126)
Anion gap: 8 (ref 5–15)
BUN: 22 mg/dL — ABNORMAL HIGH (ref 6–20)
CO2: 21 mmol/L — ABNORMAL LOW (ref 22–32)
Calcium: 8.3 mg/dL — ABNORMAL LOW (ref 8.9–10.3)
Chloride: 109 mmol/L (ref 98–111)
Creatinine, Ser: 0.98 mg/dL (ref 0.61–1.24)
GFR, Estimated: >60 mL/min (ref >60)
Glucose, Bld: 132 mg/dL — ABNORMAL HIGH (ref 70–99)
Potassium: 4 mmol/L (ref 3.5–5.1)
Sodium: 138 mmol/L (ref 135–145)
Total Bilirubin: 0.4 mg/dL (ref 0.3–1.2)
Total Protein: 5.3 g/dL — ABNORMAL LOW (ref 6.5–8.1)

## 2022-07-11 LAB — TYPE AND SCREEN
Antibody Screen: NEGATIVE
Unit division: 0
Unit division: 0

## 2022-07-11 LAB — CBC
HCT: 16.5 % — ABNORMAL LOW (ref 39.0–52.0)
Hemoglobin: 4.8 g/dL — CL (ref 13.0–17.0)
MCH: 27.1 pg (ref 26.0–34.0)
MCHC: 29.1 g/dL — ABNORMAL LOW (ref 30.0–36.0)
MCV: 93.2 fL (ref 80.0–100.0)
Platelets: 406 10*3/uL — ABNORMAL HIGH (ref 150–400)
RBC: 1.77 MIL/uL — ABNORMAL LOW (ref 4.22–5.81)
RDW: 17 % — ABNORMAL HIGH (ref 11.5–15.5)
WBC: 15.1 10*3/uL — ABNORMAL HIGH (ref 4.0–10.5)
nRBC: 0.2 % (ref 0.0–0.2)

## 2022-07-11 LAB — BPAM RBC
Blood Product Expiration Date: 202406052359
ISSUE DATE / TIME: 202405122135
Unit Type and Rh: 6200

## 2022-07-11 LAB — PREPARE RBC (CROSSMATCH)

## 2022-07-11 LAB — POC OCCULT BLOOD, ED: Fecal Occult Bld: POSITIVE — AB

## 2022-07-11 MED ORDER — SODIUM CHLORIDE 0.9% IV SOLUTION: Freq: Once | INTRAVENOUS | Status: AC

## 2022-07-11 MED ORDER — LACTATED RINGERS IV BOLUS
1000.0000 mL | Freq: Once | INTRAVENOUS | Status: AC
  Administered 2022-07-11: 1000 mL via INTRAVENOUS

## 2022-07-11 MED ORDER — LACTATED RINGERS IV BOLUS: 1000.0000 mL | Freq: Once | INTRAVENOUS | Status: DC

## 2022-07-11 NOTE — ED Notes (Signed)
Lab called a critical Hemoglobin 4.8  at 1745 Melton Alar, PA was made aware

## 2022-07-11 NOTE — ED Provider Notes (Signed)
[facility] EMERGENCY DEPARTMENT AT [facility] Provider Note   CSN: [id number] Arrival date & time: 07/11/22  1614     History  Chief Complaint  Patient presents with   Shortness of Breath   Weakness    Nathan Rowland is a 42 y.o. male with past medical history significant for portal vein thrombosis, chronic anticoagulation with Eliquis, esophageal varices, gastroesophageal varices, iron deficiency anemia presents to the ED complaining of shortness of breath, light-headedness, near syncope, and generalized weakness that has been worsening over the last week.  Patient spoke with his hematologist who recommended ED evaluation.  Patient reports he had bloody stool one week ago, but none since then.  He has been taking daily iron supplementation and was scheduled to have his first iron infusion tomorrow.  Patient also scheduled to have video capsule endoscopy (VCE) on 07/15/22.  Denies chest pain, palpitations, nausea, vomiting, diarrhea, loss of consciousness.         Home Medications Prior to Admission medications   Medication Sig Start Date End Date Taking? Authorizing Provider  apixaban (ELIQUIS) 5 MG TABS tablet Take 1 tablet (5 mg total) by mouth 2 (two) times daily. 12/04/21   Jaci Standard, MD  cyclobenzaprine (FLEXERIL) 10 MG tablet Take 0.5-1 tablets (5-10 mg total) by mouth at bedtime as needed for muscle spasms. 05/26/22   Tillman Abide I, MD  EPINEPHrine 0.3 mg/0.3 mL IJ SOAJ injection Inject 0.3 mg into the muscle once as needed for anaphylaxis.    [provider]  Iron, Ferrous Sulfate, 325 (65 Fe) MG TABS Take 325 mg by mouth daily. 05/03/22   Joaquim Nam, MD      Allergies    Pollen extract    Review of Systems   Review of Systems  Constitutional:  Positive for fatigue.  Respiratory:  Positive for shortness of breath.   Cardiovascular:  Negative for chest pain and palpitations.  Gastrointestinal:  Positive for blood in stool. Negative  for diarrhea, nausea and vomiting.  Neurological:  Positive for weakness and light-headedness. Negative for syncope.    Physical Exam Updated Vital Signs BP 113/65   Pulse (!) 115   Temp 98.5 F (36.9 C) (Oral)   Resp (!) 21   SpO2 97%  Physical Exam Vitals and nursing note reviewed.  Constitutional:      General: He is not in acute distress.    Appearance: He is not ill-appearing.  HENT:     Mouth/Throat:     Mouth: Mucous membranes are moist.     Pharynx: Oropharynx is clear.  Cardiovascular:     Rate and Rhythm: Regular rhythm. Tachycardia present.     Pulses: Normal pulses.     Heart sounds: Normal heart sounds.  Pulmonary:     Effort: Pulmonary effort is normal. No respiratory distress.     Breath sounds: Normal breath sounds and air entry.  Abdominal:     General: Abdomen is flat. Bowel sounds are normal. There is no distension.     Palpations: Abdomen is soft.     Tenderness: There is generalized abdominal tenderness (mild).  Skin:    General: Skin is warm and dry.     Capillary Refill: Capillary refill takes less than 2 seconds.  Neurological:     Mental Status: He is alert. Mental status is at baseline.  Psychiatric:        Mood and Affect: Mood normal.        Behavior: Behavior  normal.     ED Results / Procedures / Treatments   Labs (all labs ordered are listed, but only abnormal results are displayed) Labs Reviewed  COMPREHENSIVE METABOLIC PANEL - Abnormal; Notable for the following components:      Result Value   CO2 21 (*)    Glucose, Bld 132 (*)    BUN 22 (*)    Calcium 8.3 (*)    Total Protein 5.3 (*)    Albumin 2.9 (*)    All other components within normal limits  CBC - Abnormal; Notable for the following components:   WBC 15.1 (*)    RBC 1.77 (*)    Hemoglobin 4.8 (*)    HCT 16.5 (*)    MCHC 29.1 (*)    RDW 17.0 (*)    Platelets 406 (*)    All other components within normal limits  POC OCCULT BLOOD, ED - Abnormal; Notable for the  following components:   Fecal Occult Bld POSITIVE (*)    All other components within normal limits  TYPE AND SCREEN  PREPARE RBC (CROSSMATCH)  PREPARE RBC (CROSSMATCH)    EKG None  Radiology No results found.  Procedures .Critical Care  Performed by: Lenard Simmer, PA-C Authorized by: Lenard Simmer, PA-C   Critical care provider statement:    Critical care time (minutes):  30   Critical care was necessary to treat or prevent imminent or life-threatening deterioration of the following conditions:  Cardiac failure, circulatory failure and shock   Critical care was time spent personally by me on the following activities:  Development of treatment plan with patient or surrogate, discussions with consultants, evaluation of patient's response to treatment, examination of patient, ordering and review of laboratory studies, ordering and review of radiographic studies, ordering and performing treatments and interventions, pulse oximetry, re-evaluation of patient's condition, review of old charts and obtaining history from patient or surrogate   Care discussed with: admitting provider   Comments:     Patient required blood transfusion for critically low hemoglobin/symptomatic anemia     Medications Ordered in ED Medications  0.9 %  sodium chloride infusion (Manually program via Guardrails IV Fluids) ( Intravenous New Bag/Given 07/11/22 1909)  lactated ringers bolus 1,000 mL (1,000 mLs Intravenous New Bag/Given 07/11/22 2034)    ED Course/ Medical Decision Making/ A&P                             Medical Decision Making Amount and/or Complexity of Data Reviewed Labs: ordered.  Risk Prescription drug management.   This patient presents to the ED with chief complaint(s) of shortness of breath, fatigue, weakness, near syncope with pertinent past medical history of anemia, esophageal varices, portal vein thrombosis, chronic anticoagulation with Eliquis.  The complaint involves an  extensive differential diagnosis and also carries with it a high risk of complications and morbidity.    The differential diagnosis includes GI bleed, anemia secondary to blood loss, metabolic derangement, cardiac arrhythmia   The initial plan is to obtain baseline labs and hemoocult  Additional history obtained: Records reviewed  - gastroenterology - patient has had colonoscopy, EGD and scheduled VCE.  Patient with known portal vein thrombosis, esophageal and gastroesophageal varices.  Also reviewed hematology/oncology notes, patient was set up to begin iron transfusions due to iron deficiency anemia  Initial Assessment:   Exam significant for pale, ill-appearing patient who is not in acute distress.  Abdomen is soft and  non-tender to palpation.  Lungs clear to auscultation bilaterally.  Heart rate is tachycardic in the 110s with regular rhythm.  Skin is warm and dry, no jaundice.  Melanotic stool on rectal exam.    Independent ECG/labs interpretation:  The following labs were independently interpreted:  ECG demonstrates sinus tachycardia.   CBC with significant anemia, hemoglobin of 4.8, and leukocytosis.  Metabolic panel without significant electrolyte disturbance.  Hemoccult positive.    Treatment and Reassessment: Blood was ordered to transfuse patient.  I was called to bedside by RN due to patient having an apneic episode where his heart rate went down to the 40s and he was unresponsive.  After approximately 1-2 minutes, patient suddenly woke, was alert, and HR became tachycardic around 130 and tachypneic.  Patient maintained palpable pulse throughout entire event.  Patient quickly became oriented, but thought he had just "dozed off" for a moment.    Patient receiving blood products and has remained alert and oriented.  Heart rate is tachycardic around 110.  Patient not complaining of chest pain.    Consultations obtained:   I requested consultation with on-call [facility] provider and  spoke with Dr. Tomasa Rand who agreed to consult on patient in the morning.  Recommended patient by NPO at midnight for possible EGD in the AM.   Patient care transferred to [facility], PA-C at end of shift.  Plan at time of hand-off is have patient admitted to hospital for GI bleed and symptomatic anemia with GI to see patient in the AM.             Final Clinical Impression(s) / ED Diagnoses Final diagnoses:  Acute upper GI bleed  Blood loss anemia    Rx / DC Orders ED Discharge Orders     None         Lenard Simmer, PA-C 07/11/22 2209    Gloris Manchester, MD 07/14/22 0010

## 2022-07-11 NOTE — ED Triage Notes (Signed)
Pt here POV with shob and dizziness along with generalized weakness over the last week. Pt reports hx of anemia. Spoke to hematologist who recommended pt come here for further workup. Endorses dark stool a week ago. Pt on eliquis.

## 2022-07-12 ENCOUNTER — Telehealth: Payer: Self-pay

## 2022-07-12 ENCOUNTER — Observation Stay (HOSPITAL_COMMUNITY): Payer: BC Managed Care – PPO | Admitting: Anesthesiology

## 2022-07-12 ENCOUNTER — Observation Stay (HOSPITAL_COMMUNITY): Payer: BC Managed Care – PPO

## 2022-07-12 ENCOUNTER — Encounter (HOSPITAL_COMMUNITY)
Admission: EM | Disposition: A | Discharge status: Home / Self Care | Payer: Self-pay | Source: Home / Self Care | Attending: Internal Medicine

## 2022-07-12 ENCOUNTER — Other Ambulatory Visit (HOSPITAL_COMMUNITY): Payer: BC Managed Care – PPO

## 2022-07-12 ENCOUNTER — Inpatient Hospital Stay (HOSPITAL_COMMUNITY): Payer: BC Managed Care – PPO

## 2022-07-12 ENCOUNTER — Encounter (HOSPITAL_COMMUNITY): Payer: Self-pay | Admitting: Internal Medicine

## 2022-07-12 ENCOUNTER — Ambulatory Visit: Payer: BC Managed Care – PPO

## 2022-07-12 DIAGNOSIS — Z7901 Long term (current) use of anticoagulants: Secondary | ICD-10-CM | POA: Diagnosis not present

## 2022-07-12 DIAGNOSIS — Z6832 Body mass index (BMI) 32.0-32.9, adult: Secondary | ICD-10-CM | POA: Diagnosis not present

## 2022-07-12 DIAGNOSIS — R0602 Shortness of breath: Secondary | ICD-10-CM | POA: Diagnosis not present

## 2022-07-12 DIAGNOSIS — K922 Gastrointestinal hemorrhage, unspecified: Secondary | ICD-10-CM | POA: Diagnosis not present

## 2022-07-12 DIAGNOSIS — N200 Calculus of kidney: Secondary | ICD-10-CM | POA: Diagnosis not present

## 2022-07-12 DIAGNOSIS — J9601 Acute respiratory failure with hypoxia: Secondary | ICD-10-CM | POA: Diagnosis not present

## 2022-07-12 DIAGNOSIS — I864 Gastric varices: Secondary | ICD-10-CM | POA: Diagnosis not present

## 2022-07-12 DIAGNOSIS — K921 Melena: Secondary | ICD-10-CM | POA: Diagnosis present

## 2022-07-12 DIAGNOSIS — Z825 Family history of asthma and other chronic lower respiratory diseases: Secondary | ICD-10-CM | POA: Diagnosis not present

## 2022-07-12 DIAGNOSIS — K5900 Constipation, unspecified: Secondary | ICD-10-CM | POA: Diagnosis not present

## 2022-07-12 DIAGNOSIS — Z823 Family history of stroke: Secondary | ICD-10-CM | POA: Diagnosis not present

## 2022-07-12 DIAGNOSIS — J690 Pneumonitis due to inhalation of food and vomit: Secondary | ICD-10-CM | POA: Diagnosis not present

## 2022-07-12 DIAGNOSIS — K769 Liver disease, unspecified: Secondary | ICD-10-CM | POA: Diagnosis not present

## 2022-07-12 DIAGNOSIS — I959 Hypotension, unspecified: Secondary | ICD-10-CM | POA: Diagnosis not present

## 2022-07-12 DIAGNOSIS — D62 Acute posthemorrhagic anemia: Secondary | ICD-10-CM | POA: Insufficient documentation

## 2022-07-12 DIAGNOSIS — K7682 Hepatic encephalopathy: Secondary | ICD-10-CM | POA: Diagnosis not present

## 2022-07-12 DIAGNOSIS — Z833 Family history of diabetes mellitus: Secondary | ICD-10-CM | POA: Diagnosis not present

## 2022-07-12 DIAGNOSIS — K766 Portal hypertension: Secondary | ICD-10-CM | POA: Diagnosis present

## 2022-07-12 DIAGNOSIS — I8510 Secondary esophageal varices without bleeding: Secondary | ICD-10-CM | POA: Diagnosis not present

## 2022-07-12 DIAGNOSIS — I8511 Secondary esophageal varices with bleeding: Secondary | ICD-10-CM | POA: Diagnosis present

## 2022-07-12 DIAGNOSIS — I83899 Varicose veins of unspecified lower extremities with other complications: Secondary | ICD-10-CM | POA: Diagnosis present

## 2022-07-12 DIAGNOSIS — D500 Iron deficiency anemia secondary to blood loss (chronic): Secondary | ICD-10-CM | POA: Diagnosis not present

## 2022-07-12 DIAGNOSIS — I82890 Acute embolism and thrombosis of other specified veins: Secondary | ICD-10-CM | POA: Diagnosis present

## 2022-07-12 DIAGNOSIS — D649 Anemia, unspecified: Secondary | ICD-10-CM | POA: Diagnosis not present

## 2022-07-12 DIAGNOSIS — I8500 Esophageal varices without bleeding: Secondary | ICD-10-CM | POA: Diagnosis not present

## 2022-07-12 DIAGNOSIS — K7460 Unspecified cirrhosis of liver: Secondary | ICD-10-CM | POA: Diagnosis present

## 2022-07-12 DIAGNOSIS — Z8249 Family history of ischemic heart disease and other diseases of the circulatory system: Secondary | ICD-10-CM | POA: Diagnosis not present

## 2022-07-12 DIAGNOSIS — R0600 Dyspnea, unspecified: Secondary | ICD-10-CM | POA: Diagnosis not present

## 2022-07-12 DIAGNOSIS — D509 Iron deficiency anemia, unspecified: Secondary | ICD-10-CM | POA: Diagnosis present

## 2022-07-12 DIAGNOSIS — I2699 Other pulmonary embolism without acute cor pulmonale: Secondary | ICD-10-CM | POA: Diagnosis not present

## 2022-07-12 DIAGNOSIS — R0902 Hypoxemia: Secondary | ICD-10-CM | POA: Diagnosis not present

## 2022-07-12 DIAGNOSIS — E669 Obesity, unspecified: Secondary | ICD-10-CM | POA: Diagnosis present

## 2022-07-12 DIAGNOSIS — I81 Portal vein thrombosis: Secondary | ICD-10-CM | POA: Diagnosis present

## 2022-07-12 DIAGNOSIS — R188 Other ascites: Secondary | ICD-10-CM | POA: Diagnosis present

## 2022-07-12 DIAGNOSIS — K3189 Other diseases of stomach and duodenum: Secondary | ICD-10-CM | POA: Diagnosis present

## 2022-07-12 DIAGNOSIS — T8092XA Unspecified transfusion reaction, initial encounter: Secondary | ICD-10-CM

## 2022-07-12 DIAGNOSIS — J9811 Atelectasis: Secondary | ICD-10-CM | POA: Diagnosis not present

## 2022-07-12 DIAGNOSIS — R161 Splenomegaly, not elsewhere classified: Secondary | ICD-10-CM | POA: Diagnosis present

## 2022-07-12 HISTORY — PX: GIVENS CAPSULE STUDY: SHX5432

## 2022-07-12 HISTORY — PX: ENTEROSCOPY: SHX5533

## 2022-07-12 HISTORY — DX: Unspecified transfusion reaction, initial encounter: T80.92XA

## 2022-07-12 HISTORY — PX: SUBMUCOSAL TATTOO INJECTION: SHX6856

## 2022-07-12 LAB — BPAM RBC
ISSUE DATE / TIME: 202405121903
ISSUE DATE / TIME: 202405130222
Unit Type and Rh: 6200
Unit Type and Rh: 6200

## 2022-07-12 LAB — TYPE AND SCREEN
ABO/RH(D): A POS
Unit division: 0
Unit division: 0

## 2022-07-12 LAB — CBC
HCT: 12.6 % — ABNORMAL LOW (ref 39.0–52.0)
HCT: 22.2 % — ABNORMAL LOW (ref 39.0–52.0)
HCT: 30.3 % — ABNORMAL LOW (ref 39.0–52.0)
Hemoglobin: 3.8 g/dL — CL (ref 13.0–17.0)
Hemoglobin: 7.1 g/dL — ABNORMAL LOW (ref 13.0–17.0)
Hemoglobin: 9.5 g/dL — ABNORMAL LOW (ref 13.0–17.0)
MCH: 28.8 pg (ref 26.0–34.0)
MCH: 28.6 pg (ref 26.0–34.0)
MCH: 28.4 pg (ref 26.0–34.0)
MCHC: 30.2 g/dL (ref 30.0–36.0)
MCHC: 32 g/dL (ref 30.0–36.0)
MCHC: 31.4 g/dL (ref 30.0–36.0)
MCV: 95.5 fL (ref 80.0–100.0)
MCV: 89.5 fL (ref 80.0–100.0)
MCV: 90.4 fL (ref 80.0–100.0)
Platelets: 151 10*3/uL (ref 150–400)
Platelets: 258 10*3/uL (ref 150–400)
Platelets: 363 10*3/uL (ref 150–400)
RBC: 1.32 MIL/uL — ABNORMAL LOW (ref 4.22–5.81)
RBC: 2.48 MIL/uL — ABNORMAL LOW (ref 4.22–5.81)
RBC: 3.35 MIL/uL — ABNORMAL LOW (ref 4.22–5.81)
RDW: 16.1 % — ABNORMAL HIGH (ref 11.5–15.5)
RDW: 15.9 % — ABNORMAL HIGH (ref 11.5–15.5)
RDW: 16.1 % — ABNORMAL HIGH (ref 11.5–15.5)
WBC: 13.5 10*3/uL — ABNORMAL HIGH (ref 4.0–10.5)
WBC: 11.4 10*3/uL — ABNORMAL HIGH (ref 4.0–10.5)
WBC: 10.9 10*3/uL — ABNORMAL HIGH (ref 4.0–10.5)
nRBC: 0.2 % (ref 0.0–0.2)
nRBC: 0.2 % (ref 0.0–0.2)
nRBC: 0.5 % — ABNORMAL HIGH (ref 0.0–0.2)

## 2022-07-12 LAB — TROPONIN I (HIGH SENSITIVITY): Troponin I (High Sensitivity): 4 ng/L (ref <18)

## 2022-07-12 LAB — COMPREHENSIVE METABOLIC PANEL
ALT: 17 U/L (ref 0–44)
AST: 20 U/L (ref 15–41)
Albumin: 2.8 g/dL — ABNORMAL LOW (ref 3.5–5.0)
Alkaline Phosphatase: 40 U/L (ref 38–126)
Anion gap: 11 (ref 5–15)
BUN: 15 mg/dL (ref 6–20)
CO2: 18 mmol/L — ABNORMAL LOW (ref 22–32)
Calcium: 7.7 mg/dL — ABNORMAL LOW (ref 8.9–10.3)
Chloride: 111 mmol/L (ref 98–111)
Creatinine, Ser: 0.96 mg/dL (ref 0.61–1.24)
GFR, Estimated: >60 mL/min (ref >60)
Glucose, Bld: 94 mg/dL (ref 70–99)
Potassium: 3.6 mmol/L (ref 3.5–5.1)
Sodium: 140 mmol/L (ref 135–145)
Total Bilirubin: 1 mg/dL (ref 0.3–1.2)
Total Protein: 4.9 g/dL — ABNORMAL LOW (ref 6.5–8.1)

## 2022-07-12 LAB — URINALYSIS, COMPLETE (UACMP) WITH MICROSCOPIC
Bacteria, UA: NONE SEEN
Bilirubin Urine: NEGATIVE
Glucose, UA: NEGATIVE mg/dL
Hgb urine dipstick: NEGATIVE
Ketones, ur: NEGATIVE mg/dL
Leukocytes,Ua: NEGATIVE
Nitrite: NEGATIVE
Protein, ur: NEGATIVE mg/dL
Specific Gravity, Urine: 1.018 (ref 1.005–1.030)
pH: 5 (ref 5.0–8.0)

## 2022-07-12 LAB — HEMOGLOBIN
Hemoglobin: 8.9 g/dL — ABNORMAL LOW (ref 13.0–17.0)
Hemoglobin: 8.1 g/dL — ABNORMAL LOW (ref 13.0–17.0)

## 2022-07-12 LAB — PROTIME-INR
INR: 1.2 (ref 0.8–1.2)
Prothrombin Time: 15 seconds (ref 11.4–15.2)

## 2022-07-12 LAB — HIV ANTIBODY (ROUTINE TESTING W REFLEX): HIV Screen 4th Generation wRfx: NONREACTIVE

## 2022-07-12 LAB — PREPARE RBC (CROSSMATCH)

## 2022-07-12 LAB — HEMOGLOBIN AND HEMATOCRIT, BLOOD
HCT: 18.2 % — ABNORMAL LOW (ref 39.0–52.0)
HCT: 20.6 % — ABNORMAL LOW (ref 39.0–52.0)
Hemoglobin: 5.7 g/dL — CL (ref 13.0–17.0)
Hemoglobin: 6.6 g/dL — CL (ref 13.0–17.0)

## 2022-07-12 LAB — LACTATE DEHYDROGENASE: LDH: 130 U/L (ref 98–192)

## 2022-07-12 LAB — BRAIN NATRIURETIC PEPTIDE: B Natriuretic Peptide: 42.8 pg/mL (ref 0.0–100.0)

## 2022-07-12 LAB — TRANSFUSION REACTION

## 2022-07-12 LAB — APTT: aPTT: 27 seconds (ref 24–36)

## 2022-07-12 LAB — FIBRINOGEN: Fibrinogen: 352 mg/dL (ref 210–475)

## 2022-07-12 SURGERY — ENTEROSCOPY
Anesthesia: Monitor Anesthesia Care

## 2022-07-12 MED ORDER — EPHEDRINE SULFATE-NACL 50-0.9 MG/10ML-% IV SOSY
PREFILLED_SYRINGE | INTRAVENOUS | Status: DC | PRN
  Administered 2022-07-12: 30 mg via INTRAVENOUS

## 2022-07-12 MED ORDER — SODIUM CHLORIDE 0.9 % IV SOLN
1.0000 g | Freq: Every day | INTRAVENOUS | Status: DC
  Administered 2022-07-12 – 2022-07-15 (×5): 1 g via INTRAVENOUS
  Filled 2022-07-12 (×5): qty 10

## 2022-07-12 MED ORDER — FUROSEMIDE 10 MG/ML IJ SOLN
40.0000 mg | Freq: Once | INTRAMUSCULAR | Status: AC
  Administered 2022-07-12: 40 mg via INTRAVENOUS
  Filled 2022-07-12: qty 4

## 2022-07-12 MED ORDER — SODIUM CHLORIDE 0.9 % IV SOLN
200.0000 mg | Freq: Once | INTRAVENOUS | Status: DC
  Filled 2022-07-12: qty 10

## 2022-07-12 MED ORDER — TRANEXAMIC ACID-NACL 1000-0.7 MG/100ML-% IV SOLN
1000.0000 mg | Freq: Once | INTRAVENOUS | Status: AC
  Administered 2022-07-12: 1000 mg via INTRAVENOUS
  Filled 2022-07-12: qty 100

## 2022-07-12 MED ORDER — IOHEXOL 350 MG/ML SOLN
75.0000 mL | Freq: Once | INTRAVENOUS | Status: AC | PRN
  Administered 2022-07-12: 75 mL via INTRAVENOUS

## 2022-07-12 MED ORDER — LIDOCAINE 2% (20 MG/ML) 5 ML SYRINGE
INTRAMUSCULAR | Status: DC | PRN
  Administered 2022-07-12: 100 mg via INTRAVENOUS

## 2022-07-12 MED ORDER — PANTOPRAZOLE SODIUM 40 MG IV SOLR
40.0000 mg | Freq: Two times a day (BID) | INTRAVENOUS | Status: DC
  Administered 2022-07-12 (×2): 40 mg via INTRAVENOUS
  Filled 2022-07-12 (×2): qty 10

## 2022-07-12 MED ORDER — OCTREOTIDE LOAD VIA INFUSION
50.0000 ug | Freq: Once | INTRAVENOUS | Status: AC
  Administered 2022-07-12: 50 ug via INTRAVENOUS
  Filled 2022-07-12: qty 25

## 2022-07-12 MED ORDER — METRONIDAZOLE 500 MG/100ML IV SOLN
500.0000 mg | Freq: Two times a day (BID) | INTRAVENOUS | Status: DC
  Administered 2022-07-12 – 2022-07-15 (×6): 500 mg via INTRAVENOUS
  Filled 2022-07-12 (×6): qty 100

## 2022-07-12 MED ORDER — ACETAMINOPHEN 10 MG/ML IV SOLN
1000.0000 mg | Freq: Once | INTRAVENOUS | Status: AC
  Administered 2022-07-12: 1000 mg via INTRAVENOUS
  Filled 2022-07-12: qty 100

## 2022-07-12 MED ORDER — PROPOFOL 500 MG/50ML IV EMUL
INTRAVENOUS | Status: DC | PRN
  Administered 2022-07-12: 125 ug/kg/min via INTRAVENOUS

## 2022-07-12 MED ORDER — SPOT INK MARKER SYRINGE KIT
PACK | SUBMUCOSAL | Status: DC | PRN
  Administered 2022-07-12: 1.5 mL via SUBMUCOSAL

## 2022-07-12 MED ORDER — SODIUM CHLORIDE 0.9% IV SOLUTION: Freq: Once | INTRAVENOUS | Status: DC

## 2022-07-12 MED ORDER — SODIUM CHLORIDE 0.9 % IV SOLN
50.0000 ug/h | INTRAVENOUS | Status: DC
  Administered 2022-07-12: 50 ug/h via INTRAVENOUS
  Filled 2022-07-12 (×2): qty 1

## 2022-07-12 MED ORDER — DIPHENHYDRAMINE HCL 50 MG/ML IJ SOLN
25.0000 mg | Freq: Once | INTRAMUSCULAR | Status: AC
  Administered 2022-07-12: 25 mg via INTRAVENOUS
  Filled 2022-07-12: qty 1

## 2022-07-12 MED ORDER — SODIUM CHLORIDE 0.9 % IV SOLN: INTRAVENOUS | Status: AC

## 2022-07-12 MED ORDER — DIPHENHYDRAMINE HCL 25 MG PO CAPS: 25.0000 mg | ORAL_CAPSULE | Freq: Once | ORAL | Status: DC

## 2022-07-12 MED ORDER — SODIUM CHLORIDE 0.9 % IV SOLN: INTRAVENOUS | Status: DC

## 2022-07-12 MED ORDER — SODIUM CHLORIDE 0.9% IV SOLUTION: Freq: Once | INTRAVENOUS | Status: AC

## 2022-07-12 MED ORDER — ACETAMINOPHEN 325 MG PO TABS: 650.0000 mg | ORAL_TABLET | Freq: Once | ORAL | Status: DC

## 2022-07-12 MED ORDER — PROPOFOL 10 MG/ML IV BOLUS
INTRAVENOUS | Status: DC | PRN
  Administered 2022-07-12: 20 mg via INTRAVENOUS

## 2022-07-12 MED ORDER — IOHEXOL 350 MG/ML SOLN
100.0000 mL | Freq: Once | INTRAVENOUS | Status: AC | PRN
  Administered 2022-07-12: 100 mL via INTRAVENOUS

## 2022-07-12 MED ORDER — LACTATED RINGERS IV SOLN: INTRAVENOUS | Status: DC | PRN

## 2022-07-12 MED ORDER — EPINEPHRINE 0.3 MG/0.3ML IJ SOAJ
0.3000 mg | INTRAMUSCULAR | Status: AC
  Administered 2022-07-12: 0.3 mg via INTRAMUSCULAR
  Filled 2022-07-12: qty 0.3

## 2022-07-12 SURGICAL SUPPLY — 1 items: TOWEL COTTON PACK 4EA (MISCELLANEOUS) ×6 IMPLANT

## 2022-07-12 NOTE — Anesthesia Postprocedure Evaluation (Signed)
Anesthesia Post Note  Patient: Nathan Rowland  Procedure(s) Performed: ENTEROSCOPY GIVENS CAPSULE STUDY SUBMUCOSAL TATTOO INJECTION     Patient location during evaluation: PACU Anesthesia Type: MAC Level of consciousness: awake Pain management: pain level controlled Vital Signs Assessment: post-procedure vital signs reviewed and stable Respiratory status: spontaneous breathing, nonlabored ventilation and respiratory function stable Cardiovascular status: stable and blood pressure returned to baseline Postop Assessment: no apparent nausea or vomiting Anesthetic complications: no  No notable events documented.  Last Vitals:  Vitals:   07/12/22 1529 07/12/22 1757  BP: (!) 104/59 110/69  Pulse: (!) 112 (!) 118  Resp: (!) 23   Temp:    SpO2: 93% 93%    Last Pain:  Vitals:   07/12/22 1524  TempSrc: Oral  PainSc:                  Linton Rump

## 2022-07-12 NOTE — Progress Notes (Signed)
1530: Patient c/o chills accompanied with intense shivering, tachypneic, tachycardic, and low back pain. Pt placed on 4L [place]. Dr. Ashok Pall paged and came to bedside to assess. Orders placed for labs, medications, imaging, and PCCM consult.  1645: PCCM at bedside to assess patient and decision was made to keep patient on floor and to await results of labs and imaging.  1900: By end of shift, patient remains stable and in better condition. Hospitalist updated. No c/o of chills/shivering. Patient remains tachycardic (110s) and intermittent tachypneic on exertion. [facility] RN updated and WCTM.

## 2022-07-12 NOTE — ED Notes (Addendum)
RN picking up pt took PRBC with and will start in preop. PT well appearing upon transport.

## 2022-07-12 NOTE — H&P (View-Only) (Signed)
[facility] Consult Note   History ONEL KONG MRN # [id number]  Date of Admission: 07/11/2022 Date of Consultation: 07/12/2022 Referring physician: Dr. Ashok Pall, Wilfred Curtis, MD Primary Care Provider: Joaquim Nam, MD Primary Gastroenterologist: Dr. Amada Jupiter   Reason for Consultation/Chief Complaint: Acute on chronic anemia, GI bleeding  Subjective  HPI:  This is a very pleasant 42 year old man well-known to me from the outpatient setting.  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  Mild portal hypertensive gastropathy seen as well.  His clinical condition has evolved in recent months with subacute GI bleeding in December 2023, after which EGD and colonoscopy were done revealing the above findings.  No source of blood loss on colonoscopy. He passed black tarry stool about 10 days ago, after which she called his hematology consultant, had blood work done and showed a hemoglobin decreased from 10 down to about 7.  1 unit PRBCs then transfused and plans were made for outpatient IV iron treatments.  I was contacted by the hematology clinic, Helge and I traded some messages and we arrange an outpatient video capsule study scheduled for later this week. He came to the ED last evening with shortness of breath lightheadedness generalized weakness and near syncope.  He was found to have a hemoglobin of 4 and was transfused 2 units PRBCs. He was admitted to the medicine service and 2 additional units of PRBCs were ordered for a posttransfusion hemoglobin of 5.7. Adom tells me that the last week or 2 he has had some abdominal bloating and scattered dull pain.  Other than the episode of black stool about 10 days ago, he had not noticed any other changes in bowel habits.  Denies dysphagia, vomiting, loss of appetite or  notable weight change. He has been taking his Eliquis faithfully, last dose about 24 hours ago. ROS:  Constitutional: Generalized fatigue   All other systems are negative except as noted above in the HPI  Past Medical History Past Medical History:  Diagnosis Date   Allergy 11/29/1996   Finger fracture    Kidney calculus    Left knee pain    dx'd with L retropatellar knee pain    Past Surgical History Past Surgical History:  Procedure Laterality Date   IR RADIOLOGIST EVAL & MGMT  06/02/2022    Family History Family History  Problem Relation Age of Onset   Diabetes Other        3 great aunts, adult onset   Cancer Other        Uterine   Heart disease Maternal Grandmother        MI   Stroke Maternal Grandmother    Cancer Maternal Grandfather        Lung   Cancer Paternal Grandmother        Skin   Emphysema Paternal Grandfather    Hypertension Neg Hx    Depression Neg Hx    Alcohol abuse Neg Hx    Drug abuse Neg Hx    Prostate cancer Neg Hx    Colon cancer Neg Hx     Social History Social History   Socioeconomic History   Marital status: Single    Spouse name: Not on file   Number of children: Not on file   Years of education: Not on file   Highest education level: 12th grade  Occupational History  Not on file  Tobacco Use   Smoking status: Never    Passive exposure: Past   Smokeless tobacco: Never  Substance and Sexual Activity   Alcohol use: Yes    Comment: occ   Drug use: No   Sexual activity: Not on file  Other Topics Concern   Not on file  Social History Narrative   Single, 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, sports   Social Determinants of Health   Financial Resource Strain: Patient Declined (05/25/2022)   Overall Financial Resource Strain (CARDIA)    Difficulty of Paying Living Expenses: Patient declined  Food Insecurity: No Food Insecurity (07/11/2022)   Hunger Vital Sign    Worried About Running Out of Food in the Last Year: Never true     Ran Out of Food in the Last Year: Never true  Transportation Needs: No Transportation Needs (07/11/2022)   PRAPARE - Administrator, Civil Service (Medical): No    Lack of Transportation (Non-Medical): No  Physical Activity: Unknown (05/25/2022)   Exercise Vital Sign    Days of Exercise per Week: Patient declined    Minutes of Exercise per Session: Not on file  Stress: Patient Declined (05/25/2022)   [employer] of Occupational Health - Occupational Stress Questionnaire    Feeling of Stress : Patient declined  Social Connections: Unknown (05/25/2022)   Social Connection and Isolation Panel [NHANES]    Frequency of Communication with Friends and Family: Patient declined    Frequency of Social Gatherings with Friends and Family: Patient declined    Attends Religious Services: Patient declined    Database administrator or Organizations: Patient declined    Attends Engineer, structural: Not on file    Marital Status: Never married    Allergies Allergies  Allergen Reactions   Pollen Extract      Allergy injection    Outpatient Meds Home medications from the H+P and/or nursing med reconciliation reviewed.  Inpatient med list reviewed  _____________________________________________________________________ Objective   Exam:  Current vital signs  Patient Vitals for the past 8 hrs:  BP Temp Temp src Pulse Resp SpO2 Height Weight  07/12/22 0638 92/67 98.7 F (37.1 C) -- 98 19 100 % -- --  07/12/22 0600 99/70 -- -- 93 16 93 % -- --  07/12/22 0545 104/64 -- -- (!) 102 19 96 % -- --  07/12/22 0530 102/67 -- -- 86 13 95 % -- --  07/12/22 0507 -- -- -- -- -- -- 6' (1.829 m) 108.5 kg  07/12/22 0500 107/66 98.2 F (36.8 C) -- 95 17 96 % -- --  07/12/22 0445 99/72 -- -- 90 16 96 % -- --  07/12/22 0439 99/72 98.7 F (37.1 C) Oral 100 20 -- -- --  07/12/22 0438 98/65 98.7 F (37.1 C) -- 96 18 100 % -- --  07/12/22 0403 99/68 -- -- (!) 118 20 96 % -- --   07/12/22 0330 110/68 -- -- 83 12 92 % -- --  07/12/22 0301 120/74 98.2 F (36.8 C) Oral (!) 103 18 -- -- --  07/12/22 0300 120/74 -- -- (!) 101 17 100 % -- --  07/12/22 0245 -- -- -- 96 18 99 % -- --  07/12/22 0240 103/67 98.2 F (36.8 C) Oral 100 19 -- -- --  07/12/22 0230 103/67 -- -- (!) 102 19 97 % -- --  07/12/22 0135 111/65 -- -- 98 16 97 % -- --  07/11/22 2356 120/64 98.4 F (36.9 C) Oral (!) 107 19 100 % -- --    Intake/Output Summary (Last 24 hours) at 07/12/2022 0753 Last data filed at 07/12/2022 1610 Gross per 24 hour  Intake 1355.92 ml  Output --  Net 1355.92 ml    Physical Exam:   General: this is a pale, otherwise healthy appearing male patient in no acute distress.  He is visibly fatigued, but alert conversational and in good spirits. Eyes: sclera anicteric, no redness ENT: oral mucosa moist without lesions, no cervical or supraclavicular lymphadenopathy, good dentition CV: RRR without murmur, S1/S2, no JVD,, no peripheral edema Resp: clear to auscultation bilaterally, normal RR and effort noted GI: soft, overweight, no tenderness, with active bowel sounds. No guarding or palpable organomegaly noted (difficult to appreciate with body habitus).  No bruit.  No bruising on chest or abdominal walls Skin; warm and dry, no rash or jaundice noted.  Pale Neuro: awake, alert and oriented x 3. Normal gross motor function and fluent speech.  Labs:     Latest Ref Rng & Units 07/12/2022    6:36 AM 07/12/2022    2:47 AM 07/12/2022   12:48 AM  CBC  WBC 4.0 - 10.5 K/uL  13.5    Hemoglobin 13.0 - 17.0 g/dL 6.6  3.8  5.7   Hematocrit 39.0 - 52.0 % 20.6  12.6  18.2   Platelets 150 - 400 K/uL  151         Latest Ref Rng & Units 07/11/2022    4:46 PM 07/02/2022    1:43 PM 03/11/2022    2:32 PM  CMP  Glucose 70 - 99 mg/dL 960  454  098   BUN 6 - 20 mg/dL 22  21  11    Creatinine 0.61 - 1.24 mg/dL 1.19  1.47  8.29   Sodium 135 - 145 mmol/L 138  140  141   Potassium 3.5 - 5.1  mmol/L 4.0  3.9  3.9   Chloride 98 - 111 mmol/L 109  109  107   CO2 22 - 32 mmol/L 21  27  28    Calcium 8.9 - 10.3 mg/dL 8.3  8.3  9.3   Total Protein 6.5 - 8.1 g/dL 5.3  5.8  7.1   Total Bilirubin 0.3 - 1.2 mg/dL 0.4  0.2  0.4   Alkaline Phos 38 - 126 U/L 46  49  64   AST 15 - 41 U/L 19  16  17    ALT 0 - 44 U/L 20  17  18      No results for input(s): "INR" in the last 168 hours. _________________________________________________________ Radiologic studies:  Last CT angiogram 2 months ago, report on file. ______________________________________________________ Other studies:   _______________________________________________________ Assessment & Plan  Impression: Anemia of acute and chronic GI blood loss.  He was reportedly heme positive on exam this admission.  Curiously, he has had a substantial drop in hemoglobin lately with only 1 reported episode of black stool. While he is known to have large esophageal and gastric varices, this overall clinical picture does not favor variceal bleeding since there is been no hematemesis and only one episode of reported overt bleeding.  He certainly could be experiencing ongoing small bowel bleeding to a lesser degree.  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  Last dose about 24 hours ago.  Time course of his blood draw as noted  above is somewhat difficult to determine in relation to completing his blood transfusions.  If his most recent hemoglobin is 6.6 at 630 this morning is accurate and done well after completing the fourth unit of PRBCs, then it is certainly concerning that it has not risen as much as would be expected.  There is always the possibility he could be having intra abdominal or retroperitoneal bleeding as well.  Pulse currently 100, blood pressure about 100/60, he is alert and conversational and in no acute distress.   Evaluation will be warranted, but we also should rule out additional sources of bleeding. Plan: Stat CT scan abdomen and pelvis with IV contrast  Repeat CBC as well as a PT/INR and PTT (coags not drawn this admission) upon return from CT  Tentative plan for small bowel enteroscopy later today pending sufficient hemoglobin level allowing sedation.  Further transfusion may be needed.  I also discussed a tentative plan to have him swallow video capsule after the enteroscopy.  He was agreeable to all of the above.  The benefits and risks of the planned procedure were described in detail with the patient or (when appropriate) their health care proxy.  Risks were outlined as including, but not limited to, bleeding, infection, perforation, adverse medication reaction leading to cardiac or pulmonary decompensation, pancreatitis (if ERCP).  The limitation of incomplete mucosal visualization was also discussed.  No guarantees or warranties were given. As an outpatient, he was made aware of the approximate 1% risk of small bowel capsule retention, most cases of which will still spontaneously pass without causing bowel obstruction.  N.p.o. now and hold OAC pending endoscopic plan.  Thank you for the courtesy of this consult.  Please contact me with any questions or concerns.  Charlie Pitter III Office: [phone]

## 2022-07-12 NOTE — ED Notes (Signed)
Placed new PIV d/t lines not pulling back. Pt tolerated well.

## 2022-07-12 NOTE — Progress Notes (Signed)
[facility] capsule endoscopy ordered by MD Danis.  Patient ingested capsule at 1305.  Per Given's capsule instructions, patient to remain NPO until 1505 at which time they may progress to clear liquid diet. At 1705 patient may have a small snack such as a half a sandwich or a bowl of soup. At 2105 patient may progress to previously ordered diet.  The capsule endoscopy study will conclude at 0105 at which time the recorder and leads or belt can be removed and placed in a patient belongings bag. Endoscopy staff will pick up the equipment in the AM.  Instructions provided to patient and inpatient RN. Patient and RN demonstrated understanding.

## 2022-07-12 NOTE — ED Notes (Addendum)
Pt to CT, well appearing upon transport.

## 2022-07-12 NOTE — Anesthesia Procedure Notes (Signed)
Procedure Name: MAC Date/Time: 07/12/2022 12:14 PM  Performed by: Marena Chancy, CRNAPre-anesthesia Checklist: Patient identified, Emergency Drugs available, Suction available, Patient being monitored and Timeout performed Patient Re-evaluated:Patient Re-evaluated prior to induction Oxygen Delivery Method: Simple face mask

## 2022-07-12 NOTE — Interval H&P Note (Signed)
History and Physical Interval Note:  07/12/2022 11:58 AM  Nathan Rowland  has presented today for surgery, with the diagnosis of anemia, GI bleeding.  The various methods of treatment have been discussed with the patient and family. After consideration of risks, benefits and other options for treatment, the patient has consented to  Procedure(s): ENTEROSCOPY (N/A) GIVENS CAPSULE STUDY (N/A) as a surgical intervention.  The patient's history has been reviewed, patient examined, no change in status, stable for surgery.  I have reviewed the patient's chart and labs.  Questions were answered to the patient's satisfaction.     Charlie Pitter III

## 2022-07-12 NOTE — ED Notes (Signed)
Was told by ED provider PA that she wanted patient to have 3 units of blood. Only two were ordered, and blood bank said they need a new order for a third.   Asked the PA that took over for her, and he said patient is admitted so to defer to the hospitalist as he is admitted. He did say to draw an H&H and see where we are at.  Asked the hospitalist, and she said to defer until she sees him. Then she will order a unit if needed.

## 2022-07-12 NOTE — Consult Note (Signed)
NAME:  Nathan Rowland, MRN:  [id number], DOB:  August 29, 1980, LOS: 0 ADMISSION DATE:  07/11/2022, CONSULTATION DATE:  07/12/22 REFERRING MD:  Ashok Pall CHIEF COMPLAINT:  Chills, Dyspnea   History of Present Illness:  Nathan Rowland is a 42 y.o. 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 He is on daily Eliquis, last dose Sunday 5/12.  He presented to [facility] ED 5/12 with melena x 10 days. He had seen hematology 1 week prior and was given 1u PRBC for Hgb 7. In ED 5/12, Hgb noted to have dropped to 4.8.  He was admitted by [facility] and was started on PRBC transfusions. GI was consulted and small bowel enteroscopy was planned for 5/13. He had this performed and it showed type 1 GE varices without bleeding, grade III esophageal varices without bleeding. Capsule endoscopy started at 1305 on 5/13 and will end at 0105.  He had a total of 5u PRBC and after roughly 1 hour of the 5th unit, he developed sudden chills along with dyspnea and tachypnea. Of note, following his GI procedure, he started to have coughing spells. He did not receive general anesthesia, rather, he had MAC with O2 face mask only. He does not feel that he had any vomiting and is not sure that he aspirated but can't confirm (denies sensation of choking, but does feel ongoing cough and "dry throat").  He received epi pen empirically as well as 40mg  Lasix. PCCM was asked to see in consultation in case of TRALI. He has CTA pending to r/o PE (though doubtful given his anticoagulation use). He has had blood cultures drawn as part of sepsis workup and has also received 25mg  Benadryl and 1g Tylenol. He has also had transfusion reaction labs sent.  Pertinent  Medical History:  has EUSTACHIAN TUBE DYSFUNCTION, BILATERAL;  ALLERGIC RHINITIS; Injury of right toe; Administrative encounter; Concussion; Left shoulder pain; Rib pain; Portal vein thrombosis; Kidney calculus; Warts; Back strain; Iron deficiency anemia; Acute upper GI bleed; Blood loss anemia; Esophageal varices (HCC); and Melena on their problem list.  Significant Hospital Events: Including procedures, antibiotic start and stop dates in addition to other pertinent events   5/12 admit 5/13 small bowel enteroscopy, dyspnea and new O2 requirement along with chills after 5th unit PRBC, PCCM consulted.  Interim History / Subjective:  On 4L O2 via [place]. Able to speak in full sentences though does have coughing spells. Feels he has ongoing full body chills. Tachypneic to mid 20's. SBP 112.  Objective:  Blood pressure (!) 104/59, pulse (!) 112, temperature (!) 97.4 F (36.3 C), temperature source Oral, resp. rate (!) 23, height 6' (1.829 m), weight 108.9 kg, SpO2 93 %.        Intake/Output Summary (Last 24 hours) at 07/12/2022 1704 Last data filed at 07/12/2022 1234 Gross per 24 hour  Intake 1655.92 ml  Output --  Net 1655.92 ml   Filed Weights   07/12/22 0507 07/12/22 1126  Weight: 108.5 kg 108.9 kg    Examination: General: Young adult male, resting in bed, some distress due to tachypnea. Neuro: A&O x 3, no deficits. HEENT: [place]/AT. Sclerae anicteric. EOMI. Cardiovascular: Tachy, regular, no M/R/G.  Lungs: Respirations slightly labored.  CTA bilaterally, No W/R/R. Abdomen: BS x 4, soft, NT/ND.  Musculoskeletal: No gross deformities, no edema.  Skin: Intact, warm, no rashes.  Labs/imaging personally reviewed:  Small bowel enteroscopy 5/13 > type 1 GE varices without bleeding, grade III esophageal varices without bleeding. CTA chest 5/13 >   Assessment & Plan:   Acute blood loss anemia - 2/2 melena/GIB in setting of chronic anticoagulation though unclear etiology. Small bowel enteroscopy negative. Capsule endoscopy started at 1305 on 5/13 and  will end at 0105. He is s/p 5u PRBC with appropriate rise in Hgb from 4.8 to 8.9 most recently. - Monitor for ongoing bleeding. - F/u on capsule endoscopy. - GI following, appreciate the assistance. - Hold home Eliquis.  Acute hypoxic respiratory failure - some concern for aspiration. TRALI possible though not as common. Possible aspiration. - Continue supplemental O2 as needed to maintain SpO2 > 92%. - Assess response after 40mg  Lasix. - Transfusion reaction workup sent empirically. - On CTX empirically already for GIB, keep for now. - CTA already ordered, ok to rule out PE though low suspicion given his Eliquis use.  Acute cough and mild hoarseness - unclear if had some vocal cord irritation/injury during GI procedure? (Of note, was not intubated). - Supportive care. - If no improvement, consider ENT evaluation for possible laryngoscopy.  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 He is on Eliquis. - Hold home Eliquis. - GI following.   OK for current progressive level care for time being. We will check back in on home to ensure he is stable, if any worsening then will transfer to ICU for obs at least overnight.  Best practice (evaluated daily):  Per primary team.  Labs   CBC: Recent Labs  Lab 07/11/22 1646 07/12/22 0048 07/12/22 0247 07/12/22 0636 07/12/22 0835 07/12/22 1513  WBC 15.1*  --  13.5*  --  11.4*  --   HGB 4.8* 5.7* 3.8* 6.6* 7.1* 8.9*  HCT 16.5* 18.2* 12.6* 20.6* 22.2*  --   MCV 93.2  --  95.5  --  89.5  --   PLT 406*  --  151  --  258  --     Basic Metabolic Panel: Recent Labs  Lab 07/11/22 1646  NA 138  K 4.0  CL 109  CO2 21*  GLUCOSE 132*  BUN 22*  CREATININE 0.98  CALCIUM 8.3*   GFR: Estimated Creatinine Clearance: 129 mL/min (by C-G formula based on SCr  of 0.98 mg/dL). Recent Labs  Lab 07/11/22 1646 07/12/22 0247 07/12/22 0835  WBC 15.1* 13.5* 11.4*    Liver Function Tests: Recent Labs  Lab 07/11/22 1646  AST 19  ALT 20  ALKPHOS 46  BILITOT 0.4  PROT 5.3*  ALBUMIN 2.9*   No results for input(s): "LIPASE", "AMYLASE" in the last 168 hours. No results for input(s): "AMMONIA" in the last 168 hours.  ABG No results found for: "PHART", "PCO2ART", "PO2ART", "HCO3", "TCO2", "ACIDBASEDEF", "O2SAT"   Coagulation Profile: Recent Labs  Lab 07/12/22 0835  INR 1.2    Cardiac Enzymes: No results for input(s): "CKTOTAL", "CKMB", "CKMBINDEX", "TROPONINI" in the last 168 hours.  HbA1C: No results found for: "HGBA1C"  CBG: No results for input(s): "GLUCAP" in the last 168 hours.  Review of Systems:   All negative; except for those that are bolded, which indicate positives.  Constitutional: weight loss, weight gain, night sweats, fevers, chills, fatigue, weakness.  HEENT: headaches, sore throat, sneezing, nasal congestion, post nasal drip,  difficulty swallowing, tooth/dental problems, visual complaints, visual changes, ear aches. Neuro: difficulty with speech, weakness, numbness, ataxia. CV:  chest pain, orthopnea, PND, swelling in lower extremities, dizziness, palpitations, syncope.  Resp: cough, hemoptysis, dyspnea, wheezing. GI: heartburn, indigestion, abdominal pain, nausea, vomiting, diarrhea, constipation, change in bowel habits, loss of appetite, hematemesis, melena, hematochezia.  GU: dysuria, change in color of urine, urgency or frequency, flank pain, hematuria. MSK: joint pain or swelling, decreased range of motion. Psych: change in mood or affect, depression, anxiety, suicidal ideations, homicidal ideations. Skin: rash, itching, bruising.   Past Medical History:  He,  has a past medical history of Allergy (11/29/1996), Finger fracture, Kidney calculus, and Left knee pain.   Surgical History:   Past Surgical  History:  Procedure Laterality Date   IR RADIOLOGIST EVAL & MGMT  06/02/2022     Social History:   reports that he has never smoked. He has been exposed to tobacco smoke. He has never used smokeless tobacco. He reports current alcohol use. He reports that he does not use drugs.   Family History:  His family history includes Cancer in his maternal grandfather, paternal grandmother, and another family member; Diabetes in an other family member; Emphysema in his paternal grandfather; Heart disease in his maternal grandmother; Stroke in his maternal grandmother. There is no history of Hypertension, Depression, Alcohol abuse, Drug abuse, Prostate cancer, or Colon cancer.   Allergies Allergies  Allergen Reactions   Pollen Extract      Allergy injection     Home Medications  Prior to Admission medications   Medication Sig Start Date End Date Taking? Authorizing Provider  ELIQUIS 5 MG TABS tablet TAKE 1 TABLET BY MOUTH TWICE A DAY 07/12/22   Joaquim Nam, MD  EPINEPHrine 0.3 mg/0.3 mL IJ SOAJ injection Inject 0.3 mg into the muscle once as needed for anaphylaxis.    [provider]  Iron, Ferrous Sulfate, 325 (65 Fe) MG TABS Take 325 mg by mouth daily. Patient not taking: Reported on 07/11/2022 05/03/22   Joaquim Nam, MD      Rutherford Guys, PA - [facility] For pager details, please see AMION or use Epic chat  After 1900, please call [facility] for cross coverage needs 07/12/2022, 5:04 PM

## 2022-07-12 NOTE — Telephone Encounter (Signed)
Outpatient VCE appt cancelled.

## 2022-07-12 NOTE — ED Notes (Signed)
Provider at bedside

## 2022-07-12 NOTE — Transfer of Care (Signed)
Immediate Anesthesia Transfer of Care Note  Patient: Nathan Rowland  Procedure(s) Performed: ENTEROSCOPY GIVENS CAPSULE STUDY SUBMUCOSAL TATTOO INJECTION  Patient Location: PACU  Anesthesia Type:MAC  Level of Consciousness: awake, alert , and oriented  Airway & Oxygen Therapy: Patient Spontanous Breathing and Patient connected to face mask oxygen  Post-op Assessment: Report given to RN, Post -op Vital signs reviewed and stable, and Patient moving all extremities X 4  Post vital signs: Reviewed and stable  Last Vitals:  Vitals Value Taken Time  BP 111/71 07/12/22 1249  Temp 36.7 C 07/12/22 1244  Pulse 100 07/12/22 1254  Resp 27 07/12/22 1254  SpO2 93 % 07/12/22 1254  Vitals shown include unvalidated device data.  Last Pain:  Vitals:   07/12/22 1249  TempSrc:   PainSc: 0-No pain         Complications: No notable events documented.

## 2022-07-12 NOTE — ED Notes (Signed)
Pt back from CT

## 2022-07-12 NOTE — Consult Note (Signed)
[facility] Consult Note   History Nathan Rowland MRN # [id number]  Date of Admission: 07/11/2022 Date of Consultation: 07/12/2022 Referring physician: Dr. Wouk, Nathan Bedford, MD Primary Care Provider: Duncan, Nathan S, MD Primary Gastroenterologist: Dr. Pasty Manninen Rowland   Reason for Consultation/Chief Complaint: Acute on chronic anemia, GI bleeding  Subjective  HPI:  This is a very pleasant 41-year-old man well-known to me from the outpatient setting.  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  Mild portal hypertensive gastropathy seen as well.  His clinical condition has evolved in recent months with subacute GI bleeding in December 2023, after which EGD and colonoscopy were done revealing the above findings.  No source of blood loss on colonoscopy. He passed black tarry stool about 10 days ago, after which she called his hematology consultant, had blood work done and showed a hemoglobin decreased from 10 down to about 7.  1 unit PRBCs then transfused and plans were made for outpatient IV iron treatments.  I was contacted by the hematology clinic, Nathan Rowland and I traded some messages and we arrange an outpatient video capsule study scheduled for later this week. He came to the ED last evening with shortness of breath lightheadedness generalized weakness and near syncope.  He was found to have a hemoglobin of 4 and was transfused 2 units PRBCs. He was admitted to the medicine service and 2 additional units of PRBCs were ordered for a posttransfusion hemoglobin of 5.7. [name] tells me that the last week or 2 he has had some abdominal bloating and scattered dull pain.  Other than the episode of black stool about 10 days ago, he had not noticed any other changes in bowel habits.  Denies dysphagia, vomiting, loss of appetite or  notable weight change. He has been taking his Eliquis faithfully, last dose about 24 hours ago. ROS:  Constitutional: Generalized fatigue   All other systems are negative except as noted above in the HPI  Past Medical History Past Medical History:  Diagnosis Date   Allergy 11/29/1996   Finger fracture    Kidney calculus    Left knee pain    dx'd with L retropatellar knee pain    Past Surgical History Past Surgical History:  Procedure Laterality Date   IR RADIOLOGIST EVAL & MGMT  06/02/2022    Family History Family History  Problem Relation Age of Onset   Diabetes Other        3 great aunts, adult onset   Cancer Other        Uterine   Heart disease Maternal Grandmother        MI   Stroke Maternal Grandmother    Cancer Maternal Grandfather        Lung   Cancer Paternal Grandmother        Skin   Emphysema Paternal Grandfather    Hypertension Neg Hx    Depression Neg Hx    Alcohol abuse Neg Hx    Drug abuse Neg Hx    Prostate cancer Neg Hx    Colon cancer Neg Hx     Social History Social History   Socioeconomic History   Marital status: Single    Spouse name: Not on file   Number of children: Not on file   Years of education: Not on file   Highest education level: 12th grade  Occupational History     Not on file  Tobacco Use   Smoking status: Never    Passive exposure: Past   Smokeless tobacco: Never  Substance and Sexual Activity   Alcohol use: Yes    Comment: occ   Drug use: No   Sexual activity: Not on file  Other Topics Concern   Not on file  Social History Narrative   Single, 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   Social Determinants of Health   Financial Resource Strain: Patient Declined (05/25/2022)   Overall Financial Resource Strain (CARDIA)    Difficulty of Paying Living Expenses: Patient declined  Food Insecurity: No Food Insecurity (07/11/2022)   Hunger Vital Sign    Worried About Running Out of Food in the Last Year: Never true     Ran Out of Food in the Last Year: Never true  Transportation Needs: No Transportation Needs (07/11/2022)   PRAPARE - Transportation    Lack of Transportation (Medical): No    Lack of Transportation (Non-Medical): No  Physical Activity: Unknown (05/25/2022)   Exercise Vital Sign    Days of Exercise per Week: Patient declined    Minutes of Exercise per Session: Not on file  Stress: Patient Declined (05/25/2022)   [employer] - Occupational Stress Questionnaire    Feeling of Stress : Patient declined  Social Connections: Unknown (05/25/2022)   Social Connection and Isolation Panel [NHANES]    Frequency of Communication with Friends and Family: Patient declined    Frequency of Social Gatherings with Friends and Family: Patient declined    Attends Religious Services: Patient declined    Active Member of Clubs or Organizations: Patient declined    Attends Club or Organization Meetings: Not on file    Marital Status: Never married    Allergies Allergies  Allergen Reactions   Pollen Extract      Allergy injection    Outpatient Meds Home medications from the H+P and/or nursing med reconciliation reviewed.  Inpatient med list reviewed  _____________________________________________________________________ Objective   Exam:  Current vital signs  Patient Vitals for the past 8 hrs:  BP Temp Temp src Pulse Resp SpO2 Height Weight  07/12/22 0638 92/67 98.7 F (37.1 C) -- 98 19 100 % -- --  07/12/22 0600 99/70 -- -- 93 16 93 % -- --  07/12/22 0545 104/64 -- -- (!) 102 19 96 % -- --  07/12/22 0530 102/67 -- -- 86 13 95 % -- --  07/12/22 0507 -- -- -- -- -- -- 6' (1.829 m) 108.5 kg  07/12/22 0500 107/66 98.2 F (36.8 C) -- 95 17 96 % -- --  07/12/22 0445 99/72 -- -- 90 16 96 % -- --  07/12/22 0439 99/72 98.7 F (37.1 C) Oral 100 20 -- -- --  07/12/22 0438 98/65 98.7 F (37.1 C) -- 96 18 100 % -- --  07/12/22 0403 99/68 -- -- (!) 118 20 96 % -- --   07/12/22 0330 110/68 -- -- 83 12 92 % -- --  07/12/22 0301 120/74 98.2 F (36.8 C) Oral (!) 103 18 -- -- --  07/12/22 0300 120/74 -- -- (!) 101 17 100 % -- --  07/12/22 0245 -- -- -- 96 18 99 % -- --  07/12/22 0240 103/67 98.2 F (36.8 C) Oral 100 19 -- -- --  07/12/22 0230 103/67 -- -- (!) 102 19 97 % -- --  07/12/22 0135 111/65 -- -- 98 16 97 % -- --    07/11/22 2356 120/64 98.4 F (36.9 C) Oral (!) 107 19 100 % -- --    Intake/Output Summary (Last 24 hours) at 07/12/2022 0753 Last data filed at 07/12/2022 0638 Gross per 24 hour  Intake 1355.92 ml  Output --  Net 1355.92 ml    Physical Exam:   General: this is a pale, otherwise healthy appearing male patient in no acute distress.  He is visibly fatigued, but alert conversational and in good spirits. Eyes: sclera anicteric, no redness ENT: oral mucosa moist without lesions, no cervical or supraclavicular lymphadenopathy, good dentition CV: RRR without murmur, S1/S2, no JVD,, no peripheral edema Resp: clear to auscultation bilaterally, normal RR and effort noted GI: soft, overweight, no tenderness, with active bowel sounds. No guarding or palpable organomegaly noted (difficult to appreciate with body habitus).  No bruit.  No bruising on chest or abdominal walls Skin; warm and dry, no rash or jaundice noted.  Pale Neuro: awake, alert and oriented x 3. Normal gross motor function and fluent speech.  Labs:     Latest Ref Rng & Units 07/12/2022    6:36 AM 07/12/2022    2:47 AM 07/12/2022   12:48 AM  CBC  WBC 4.0 - 10.5 K/uL  13.5    Hemoglobin 13.0 - 17.0 g/dL 6.6  3.8  5.7   Hematocrit 39.0 - 52.0 % 20.6  12.6  18.2   Platelets 150 - 400 K/uL  151         Latest Ref Rng & Units 07/11/2022    4:46 PM 07/02/2022    1:43 PM 03/11/2022    2:32 PM  CMP  Glucose 70 - 99 mg/dL 132  111  100   BUN 6 - 20 mg/dL 22  21  11   Creatinine 0.61 - 1.24 mg/dL 0.98  0.92  1.12   Sodium 135 - 145 mmol/L 138  140  141   Potassium 3.5 - 5.1  mmol/L 4.0  3.9  3.9   Chloride 98 - 111 mmol/L 109  109  107   CO2 22 - 32 mmol/L 21  27  28   Calcium 8.9 - 10.3 mg/dL 8.3  8.3  9.3   Total Protein 6.5 - 8.1 g/dL 5.3  5.8  7.1   Total Bilirubin 0.3 - 1.2 mg/dL 0.4  0.2  0.4   Alkaline Phos 38 - 126 U/L 46  49  64   AST 15 - 41 U/L 19  16  17   ALT 0 - 44 U/L 20  17  18     No results for input(s): "INR" in the last 168 hours. _________________________________________________________ Radiologic studies:  Last CT angiogram 2 months ago, report on file. ______________________________________________________ Other studies:   _______________________________________________________ Assessment & Plan  Impression: Anemia of acute and chronic GI blood loss.  He was reportedly heme positive on exam this admission.  Curiously, he has had a substantial drop in hemoglobin lately with only 1 reported episode of black stool. While he is known to have large esophageal and gastric varices, this overall clinical picture does not favor variceal bleeding since there is been no hematemesis and only one episode of reported overt bleeding.  He certainly could be experiencing ongoing small bowel bleeding to a lesser degree.  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  Last dose about 24 hours ago.  Time course of his blood draw as noted   above is somewhat difficult to determine in relation to completing his blood transfusions.  If his most recent hemoglobin is 6.6 at 630 this morning is accurate and done well after completing the fourth unit of PRBCs, then it is certainly concerning that it has not risen as much as would be expected.  There is always the possibility he could be having intra abdominal or retroperitoneal bleeding as well.  Pulse currently 100, blood pressure about 100/60, he is alert and conversational and in no acute distress.   Evaluation will be warranted, but we also should rule out additional sources of bleeding. Plan: Stat CT scan abdomen and pelvis with IV contrast  Repeat CBC as well as a PT/INR and PTT (coags not drawn this admission) upon return from CT  Tentative plan for small bowel enteroscopy later today pending sufficient hemoglobin level allowing sedation.  Further transfusion may be needed.  I also discussed a tentative plan to have him swallow video capsule after the enteroscopy.  He was agreeable to all of the above.  The benefits and risks of the planned procedure were described in detail with the patient or (when appropriate) their health care proxy.  Risks were outlined as including, but not limited to, bleeding, infection, perforation, adverse medication reaction leading to cardiac or pulmonary decompensation, pancreatitis (if ERCP).  The limitation of incomplete mucosal visualization was also discussed.  No guarantees or warranties were given. As an outpatient, he was made aware of the approximate 1% risk of small bowel capsule retention, most cases of which will still spontaneously pass without causing bowel obstruction.  N.p.o. now and hold OAC pending endoscopic plan.  Thank you for the courtesy of this consult.  Please contact me with any questions or concerns.  Kaled Allende L Rowland III Office: [phone]  

## 2022-07-12 NOTE — H&P (Signed)
History and Physical    [name]:[id number] DOB: 12-21-1980 DOA: 07/11/2022  PCP: Joaquim Nam, MD  Patient coming from: Home  Chief Complaint: Dark stools  HPI: Nathan Rowland is a 42 y.o. male with medical history significant of portal vein thrombosis of unclear etiology on Eliquis, portal hypertension, esophageal varices, gastroesophageal varices, history of anemia and GI bleed presented to ED complaining of shortness of breath, lightheadedness, near syncope, generalized weakness, and dark stools.  Patient tachycardic but not hypotensive.  Labs showing WBC 15.1, hemoglobin 4.8, MCV 93.2, platelet count 406k, sodium 138, potassium 4.0, chloride 109, bicarb 21, BUN 22, creatinine 0.9, glucose 132.  Melena noted on rectal exam and FOBT positive.  Patient was given 1 L LR bolus.  ED provider discussed the case with Dr. Tomasa Rand with [facility] who recommended keeping the patient n.p.o. at midnight for possible EGD in the morning.  Patient was given 2 units PRBCs and repeat hemoglobin 5.7.  Patient states over 10 days ago he started having dark stools and saw his hematologist and had blood work done which showed hemoglobin just above 7 and he was given 1 unit blood transfusion after which he felt better.  However, he has continued to have dark stools since then and having generalized weakness, dyspnea on exertion, and dizziness/lightheadedness.  No episodes of syncope.  He takes Eliquis at home and last dose was yesterday morning.  He has not vomited blood.  He has had some generalized abdominal pain over the past 10 days but it has now resolved and he denies any abdominal pain or discomfort at present.  Denies chest pain.  He has no other complaints.  Review of Systems:  Review of Systems  All other systems reviewed and are negative.   Past Medical History:  Diagnosis Date   Allergy 11/29/1996   Finger fracture    Kidney calculus    Left knee pain    dx'd with L  retropatellar knee pain    Past Surgical History:  Procedure Laterality Date   IR RADIOLOGIST EVAL & MGMT  06/02/2022     reports that he has never smoked. He has been exposed to tobacco smoke. He has never used smokeless tobacco. He reports current alcohol use. He reports that he does not use drugs.  Allergies  Allergen Reactions   Pollen Extract      Allergy injection    Family History  Problem Relation Age of Onset   Diabetes Other        3 great aunts, adult onset   Cancer Other        Uterine   Heart disease Maternal Grandmother        MI   Stroke Maternal Grandmother    Cancer Maternal Grandfather        Lung   Cancer Paternal Grandmother        Skin   Emphysema Paternal Grandfather    Hypertension Neg Hx    Depression Neg Hx    Alcohol abuse Neg Hx    Drug abuse Neg Hx    Prostate cancer Neg Hx    Colon cancer Neg Hx     Prior to Admission medications   Medication Sig Start Date End Date Taking? Authorizing Provider  apixaban (ELIQUIS) 5 MG TABS tablet Take 1 tablet (5 mg total) by mouth 2 (two) times daily. 12/04/21  Yes Ulysees Barns IV, MD  EPINEPHrine 0.3 mg/0.3 mL IJ SOAJ injection Inject 0.3 mg into  the muscle once as needed for anaphylaxis.    [provider]  Iron, Ferrous Sulfate, 325 (65 Fe) MG TABS Take 325 mg by mouth daily. Patient not taking: Reported on 07/11/2022 05/03/22   Joaquim Nam, MD    Physical Exam: Vitals:   07/11/22 2244 07/11/22 2300 07/11/22 2330 07/11/22 2356  BP: 122/75 116/75 (!) 111/57 120/64  Pulse: (!) 105 (!) 106 (!) 102 (!) 107  Resp: 20 14 20 19   Temp: 98.5 F (36.9 C)   98.4 F (36.9 C)  TempSrc:    Oral  SpO2: 100% 100% 100% 100%    Physical Exam Vitals reviewed.  Constitutional:      General: He is not in acute distress. HENT:     Head: Normocephalic and atraumatic.  Eyes:     Extraocular Movements: Extraocular movements intact.  Cardiovascular:     Rate and Rhythm: Normal rate and regular  rhythm.     Pulses: Normal pulses.  Pulmonary:     Effort: Pulmonary effort is normal. No respiratory distress.     Breath sounds: Normal breath sounds. No wheezing or rales.  Abdominal:     General: Bowel sounds are normal.     Palpations: Abdomen is soft.     Tenderness: There is no abdominal tenderness. There is no guarding.  Musculoskeletal:     Cervical back: Normal range of motion.     Right lower leg: No edema.     Left lower leg: No edema.  Skin:    General: Skin is warm and dry.  Neurological:     General: No focal deficit present.     Mental Status: He is alert and oriented to person, place, and time.     Labs on Admission: I have personally reviewed following labs and imaging studies  CBC: Recent Labs  Lab 07/11/22 1646  WBC 15.1*  HGB 4.8*  HCT 16.5*  MCV 93.2  PLT 406*   Basic Metabolic Panel: Recent Labs  Lab 07/11/22 1646  NA 138  K 4.0  CL 109  CO2 21*  GLUCOSE 132*  BUN 22*  CREATININE 0.98  CALCIUM 8.3*   GFR: CrCl cannot be calculated (Unknown ideal weight.). Liver Function Tests: Recent Labs  Lab 07/11/22 1646  AST 19  ALT 20  ALKPHOS 46  BILITOT 0.4  PROT 5.3*  ALBUMIN 2.9*   No results for input(s): "LIPASE", "AMYLASE" in the last 168 hours. No results for input(s): "AMMONIA" in the last 168 hours. Coagulation Profile: No results for input(s): "INR", "PROTIME" in the last 168 hours. Cardiac Enzymes: No results for input(s): "CKTOTAL", "CKMB", "CKMBINDEX", "TROPONINI" in the last 168 hours. BNP (last 3 results) No results for input(s): "PROBNP" in the last 8760 hours. HbA1C: No results for input(s): "HGBA1C" in the last 72 hours. CBG: No results for input(s): "GLUCAP" in the last 168 hours. Lipid Profile: No results for input(s): "CHOL", "HDL", "LDLCALC", "TRIG", "CHOLHDL", "LDLDIRECT" in the last 72 hours. Thyroid Function Tests: No results for input(s): "TSH", "T4TOTAL", "FREET4", "T3FREE", "THYROIDAB" in the last 72  hours. Anemia Panel: No results for input(s): "VITAMINB12", "FOLATE", "FERRITIN", "TIBC", "IRON", "RETICCTPCT" in the last 72 hours. Urine analysis:    Component Value Date/Time   COLORURINE YELLOW 05/15/2019 0703   APPEARANCEUR CLEAR 05/15/2019 0703   LABSPEC 1.018 05/15/2019 0703   PHURINE 8.0 05/15/2019 0703   GLUCOSEU NEGATIVE 05/15/2019 0703   HGBUR LARGE (A) 05/15/2019 0703   BILIRUBINUR NEGATIVE 05/15/2019 0703   BILIRUBINUR Neg  05/30/2012 1445   KETONESUR NEGATIVE 05/15/2019 0703   PROTEINUR 30 (A) 05/15/2019 0703   UROBILINOGEN negative 05/30/2012 1445   NITRITE NEGATIVE 05/15/2019 0703   LEUKOCYTESUR NEGATIVE 05/15/2019 0703    Radiological Exams on Admission: No results found.  EKG: Independently reviewed.  Sinus tachycardia, no significant change since prior tracing.  Assessment and Plan  Acute upper GI bleed/melena Acute blood loss anemia Portal vein thrombosis of unclear etiology, esophageal varices, gastroesophageal varices Patient with history of portal vein thrombosis of unclear etiology on Eliquis and presenting with complaint of dark stools for over 10 days.  Hemoglobin 4.8, was 7.3 on labs done 10 days ago and previously 12.7 on 04/21/2022.  Melena noted on rectal exam and FOBT positive.  No hematemesis.  EGD done 03/31/2022 showing grade 3 and large esophageal varices, portal hypertensive gastropathy, and type I gastroesophageal varices.  Patient was given 2 units PRBCs in the ED and repeat hemoglobin 5.7.  Tachycardia has improved with IV fluids and blood pressure remains stable.  [facility] consulted and planning on possible EGD in the morning.  Keep n.p.o. and continue IV fluid hydration.  Start octreotide bolus and infusion, IV Protonix 40 mg every 12 hours, and ceftriaxone for antibiotic prophylaxis.  Additional 2 units PRBCs ordered, monitor H&H and continue to transfuse if hemoglobin less than 7.  Hold Eliquis.  DVT prophylaxis: SCDs Code Status: Full Code  (discussed with the patient) Level of care: Progressive Care Unit Admission status: It is my clinical opinion that referral for OBSERVATION is reasonable and necessary in this patient based on the above information provided. The aforementioned taken together are felt to place the patient at high risk for further clinical deterioration. However, it is anticipated that the patient may be medically stable for discharge from the hospital within 24 to 48 hours.   John Giovanni MD [facility]  If 7PM-7AM, please contact night-coverage [email or web address]  07/12/2022, 1:08 AM

## 2022-07-12 NOTE — Op Note (Signed)
[facility] Patient Name: Nathan Rowland Procedure Date : 07/12/2022 MRN: [id number] Attending MD: Starr Lake. Myrtie Neither , MD, [id number] Date of Birth: 08/16/1980 CSN: [id number] Age: 42 Admit Type: Inpatient Procedure:                Small bowel enteroscopy Indications:              Acute post hemorrhagic anemia, Iron deficiency                            anemia secondary to chronic blood loss, Melena Providers:                Sherilyn Cooter L. Myrtie Neither, MD, Lorenza Evangelist, RN, Rozetta Nunnery, Technician Referring MD:             [facility] Medicines:                Monitored Anesthesia Care Complications:            No immediate complications. Estimated Blood Loss:     Estimated blood loss: none. Procedure:                Pre-Anesthesia Assessment:                           - Prior to the procedure, a History and Physical                            was performed, and patient medications and                            allergies were reviewed. The patient's tolerance of                            previous anesthesia was also reviewed. The risks                            and benefits of the procedure and the sedation                            options and risks were discussed with the patient.                            All questions were answered, and informed consent                            was obtained. Prior Anticoagulants: The patient has                            taken Eliquis (apixaban), last dose was 1 day prior                            to procedure. ASA Grade Assessment: III - A patient  with severe systemic disease. After reviewing the                            risks and benefits, the patient was deemed in                            satisfactory condition to undergo the procedure.                           After obtaining informed consent, the endoscope was                            passed under direct  vision. Throughout the                            procedure, the patient's blood pressure, pulse, and                            oxygen saturations were monitored continuously. The                            [id number]-[id number] ([id number]) Olympus peds colonoscope was                            introduced through the mouth and advanced to the                            proximal jejunum. The small bowel enteroscopy was                            accomplished without difficulty. The patient                            tolerated the procedure well. Scope In: Scope Out: Findings:      There was no evidence of significant pathology in the entire examined       portion of jejunum. Scope advanced to 100cm from the teeth with scope       reduced. Scope advancement limited by looping in stomach and caution       warranted to avoid traumatizing varices with scope.      Area of maximal scope insertion was tattooed with an injection of 0.5 mL       of Spot (carbon black). (Some tattoo material was inadvertently released       into the lumen, potentially obscuring the video capsule images.) Clear       bile in the stomach and upper small bowel.      Type 1 gastroesophageal varices (GOV1, esophageal varices which extend       along the lesser curvature) with no bleeding were found in the cardia.       There were no stigmata of recent bleeding, and these were smaller than       the esophageal varices.      Grade III varices were found in the lower third of the esophagus. No       active bleeding or stigmata of bleeding. No overlying esophagitis. Impression:               -  The examined portion of the jejunum was normal.                            Tattooed.                           - Type 1 gastroesophageal varices (GOV1, esophageal                            varices which extend along the lesser curvature),                            without bleeding.                           - Grade III esophageal varices.                            - No specimens collected. Recommendation:           - Return patient to hospital ward for ongoing care.                           - To visualize the small bowel, perform video                            capsule endoscopy today.                           - Q 8 hr Hgb/Hct (patient had melena earlier today                            after initial GI consultation) Procedure Code(s):        --- Professional ---                           [phone], Small intestinal endoscopy, enteroscopy                            beyond second portion of duodenum, not including                            ileum; diagnostic, including collection of                            specimen(s) by brushing or washing, when performed                            (separate procedure)                           44799, Unlisted procedure, small intestine Diagnosis Code(s):        --- Professional ---                           I86.4, Gastric varices  I85.00, Esophageal varices without bleeding                           D62, Acute posthemorrhagic anemia                           D50.0, Iron deficiency anemia secondary to blood                            loss (chronic)                           K92.1, Melena (includes Hematochezia) CPT copyright 2022 [employer]. All rights reserved. The codes documented in this report are preliminary and upon coder review may  be revised to meet current compliance requirements. [name] L. Myrtie Neither, MD 07/12/2022 12:45:31 PM This report has been signed electronically. Number of Addenda: 0

## 2022-07-12 NOTE — ED Notes (Signed)
Pt reports last meal at 2100 and last clear liquid just before midnight on 07/11/2022.

## 2022-07-12 NOTE — Progress Notes (Addendum)
Interim progress note not for billing.  I have reviewed chart, seen and examined patient. Agree with h and p assessment and plan unless otherwise stipulated.  64m hx portal vein thrombosis of unclear etiology (possible sequelae of mcv; hypercoagulable w/u negative), on apixaban, with an episode of GI bleeding several months ago, w/u of that revealed splenomegaly and esophageal varices, now with 10 days of one episode hematochezia as well as black stools. Hgb 4.8 on arrival was 7.3 several weeks ago, has dropped as low as 3.8 and is 7.1 today s/p 4 units PRBCs. Hemodynamically stable. Does have intermittent abdominal pain. Has been started on ppi, octreotide, ceftriaxone. GI consulted. Has received CTA of abdomen this morning to eval for source of bleed and also to r/o other bleeding. Tentative plan for small bowel enteroscopy later today. Will continue to trend hgb and transfuse as needed. Last apixaban was 5/12 @ 09:00. I will order a dose of TXA.   Addendum 16:15  Small bowel enteroscopy finished around noon today, no source of bleeding identified. Blood transfusion ordered by gi finished around 2. Since the procedure patient reports shaking chills and cough and dyspnea, feeling unwell. He is hypoxic to the 80s, coughing, and wheezing on exam. No fever but soft bp 104/59. I am concerned for possible TACO and so will stop IV fluids, order CXR, give lasix 40 IV once. Will check CTA of chest to eval for PE and complication from endoscopy. Given hypotension and wheeze anaphylaxis is on ddx and so will give epinephrine. Given shaking chills sepsis is on ddx and so will check blood cultures. Will evaluate for hemolytic reaction with cbc, DAT, ldh, fibrinogen, cbc. Given concern for TRALI will involve ICU team. Will notify blood bank of concern for transfusion reaction.

## 2022-07-12 NOTE — ED Provider Notes (Signed)
Received at shift change from Bobbye Riggs, PA-C please see note for full detail  In short patient with complex medical history including portal vein thrombosis, on anticoag Eliquis, esophagus varices, gastric varices, presenting with complaints of dark tarry stools, fatigue, lightheadedness, near syncope.  Workup reveals significant micro Citic anemia with a decreasing hemoglobin by 2 g over the last week, Hemoccult is positive, previous provider spoke with [facility] Dr. Tomasa Rand who would like admit to hospitalist team and keep n.p.o. likely go to endoscopy early tomorrow.  Patient is currently receiving 2 units of blood.  Per previous provider admit to medicine.  My assessment patient is resting comfortably, vital signs have improved, initially came in with a heart rate in the 130s, after 2 units, heart rate is now in the low 100s, hemodynamically stable, abdomen soft nontender, patient states he has no complaints.  Spoke with Dr. Mabeline Caras who will admit the patient.   Carroll Sage, PA-C 07/12/22 0018    Shon Baton, MD 07/12/22 [phone]

## 2022-07-12 NOTE — ED Notes (Signed)
Was told by ED provider

## 2022-07-12 NOTE — ED Notes (Signed)
This RN assumed care of patient and received off going transfer of care report from off going RN. Pt presented to the ED with SOB and general weakness from home. Pt is resting on gurney at this time, respirations are spontaneous, even, unlabored and symmetrical bilaterally. Pt skin tone is appropriate for ethnicity, dry and warm. Pt connected to CCM, pulse ox and BP. Call light within reach.

## 2022-07-12 NOTE — Anesthesia Preprocedure Evaluation (Addendum)
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   Endo/Other  negative endocrine ROS    Renal/GU negative Renal ROS     Musculoskeletal   Abdominal  (+) + obese  Peds  Hematology  (+) Blood dyscrasia (iron deficiency), anemia   Anesthesia Other Findings Last Eliquis: 07/11/2022  Hgb 7.1 after 4 units pRBCs. 1 unit pRBC being started in preop.  Reproductive/Obstetrics                             Anesthesia Physical Anesthesia Plan  ASA: 4  Anesthesia Plan: MAC   Post-op Pain Management:    Induction: Intravenous  PONV Risk Score and Plan: 1 and Propofol infusion and Treatment may vary due to age or medical condition  Airway Management Planned: Natural Airway and Simple Face Mask  Additional Equipment:   Intra-op Plan:   Post-operative Plan:   Informed Consent: I have reviewed the patients History and Physical, chart, labs and discussed the procedure including the risks, benefits and alternatives for the proposed anesthesia with the patient or authorized representative who has indicated his/her understanding and acceptance.     Dental advisory given  Plan  Discussed with: CRNA and Anesthesiologist  Anesthesia Plan Comments: (Discussed with patient risks of MAC including, but not limited to, minor pain or discomfort, hearing people in the room, and possible need for backup general anesthesia. Risks for general anesthesia also discussed including, but not limited to, sore throat, hoarse voice, chipped/damaged teeth, injury to vocal cords, nausea and vomiting, allergic reactions, lung infection, heart attack, stroke, and death. All questions answered. )       Anesthesia Quick Evaluation

## 2022-07-13 DIAGNOSIS — Z7901 Long term (current) use of anticoagulants: Secondary | ICD-10-CM

## 2022-07-13 DIAGNOSIS — K766 Portal hypertension: Secondary | ICD-10-CM

## 2022-07-13 DIAGNOSIS — J9601 Acute respiratory failure with hypoxia: Secondary | ICD-10-CM | POA: Diagnosis not present

## 2022-07-13 DIAGNOSIS — K922 Gastrointestinal hemorrhage, unspecified: Secondary | ICD-10-CM | POA: Diagnosis not present

## 2022-07-13 DIAGNOSIS — I8500 Esophageal varices without bleeding: Secondary | ICD-10-CM | POA: Diagnosis not present

## 2022-07-13 DIAGNOSIS — D62 Acute posthemorrhagic anemia: Secondary | ICD-10-CM | POA: Diagnosis not present

## 2022-07-13 DIAGNOSIS — D500 Iron deficiency anemia secondary to blood loss (chronic): Secondary | ICD-10-CM | POA: Diagnosis not present

## 2022-07-13 DIAGNOSIS — K921 Melena: Secondary | ICD-10-CM | POA: Diagnosis not present

## 2022-07-13 LAB — TYPE AND SCREEN: Unit division: 0

## 2022-07-13 LAB — COMPREHENSIVE METABOLIC PANEL
ALT: 17 U/L (ref 0–44)
AST: 17 U/L (ref 15–41)
Albumin: 2.3 g/dL — ABNORMAL LOW (ref 3.5–5.0)
Alkaline Phosphatase: 36 U/L — ABNORMAL LOW (ref 38–126)
Anion gap: 7 (ref 5–15)
BUN: 15 mg/dL (ref 6–20)
CO2: 23 mmol/L (ref 22–32)
Calcium: 7.4 mg/dL — ABNORMAL LOW (ref 8.9–10.3)
Chloride: 107 mmol/L (ref 98–111)
Creatinine, Ser: 1.04 mg/dL (ref 0.61–1.24)
GFR, Estimated: >60 mL/min (ref >60)
Glucose, Bld: 114 mg/dL — ABNORMAL HIGH (ref 70–99)
Potassium: 3.4 mmol/L — ABNORMAL LOW (ref 3.5–5.1)
Sodium: 137 mmol/L (ref 135–145)
Total Bilirubin: 0.7 mg/dL (ref 0.3–1.2)
Total Protein: 4.3 g/dL — ABNORMAL LOW (ref 6.5–8.1)

## 2022-07-13 LAB — BPAM RBC
Blood Product Expiration Date: 202406052359
Blood Product Expiration Date: 202406112359
Blood Product Expiration Date: 202406112359
Blood Product Expiration Date: 202406122359
ISSUE DATE / TIME: 202405130434
ISSUE DATE / TIME: 202405131031
Unit Type and Rh: 6200
Unit Type and Rh: 6200

## 2022-07-13 LAB — CBC
HCT: 23.1 % — ABNORMAL LOW (ref 39.0–52.0)
Hemoglobin: 7.5 g/dL — ABNORMAL LOW (ref 13.0–17.0)
MCH: 28.5 pg (ref 26.0–34.0)
MCHC: 32.5 g/dL (ref 30.0–36.0)
MCV: 87.8 fL (ref 80.0–100.0)
Platelets: 229 10*3/uL (ref 150–400)
RBC: 2.63 MIL/uL — ABNORMAL LOW (ref 4.22–5.81)
RDW: 16.1 % — ABNORMAL HIGH (ref 11.5–15.5)
WBC: 12.6 10*3/uL — ABNORMAL HIGH (ref 4.0–10.5)
nRBC: 0.2 % (ref 0.0–0.2)

## 2022-07-13 LAB — TRANSFUSION REACTION
DAT C3: NEGATIVE
Post RXN DAT IgG: NEGATIVE

## 2022-07-13 LAB — CULTURE, BLOOD (ROUTINE X 2)

## 2022-07-13 MED ORDER — PEG-KCL-NACL-NASULF-NA ASC-C 100 G PO SOLR
0.5000 | Freq: Once | ORAL | Status: AC
  Administered 2022-07-13: 100 g via ORAL
  Filled 2022-07-13: qty 1

## 2022-07-13 MED ORDER — PEG-KCL-NACL-NASULF-NA ASC-C 100 G PO SOLR: 1.0000 | Freq: Once | ORAL | Status: DC

## 2022-07-13 MED ORDER — SODIUM CHLORIDE 0.9 % IV SOLN: INTRAVENOUS | Status: DC

## 2022-07-13 MED ORDER — ONDANSETRON HCL 4 MG/2ML IJ SOLN: 4.0000 mg | Freq: Four times a day (QID) | INTRAMUSCULAR | Status: DC | PRN

## 2022-07-13 MED ORDER — ACETAMINOPHEN 325 MG PO TABS
650.0000 mg | ORAL_TABLET | Freq: Four times a day (QID) | ORAL | Status: DC | PRN
  Administered 2022-07-13 – 2022-07-24 (×5): 650 mg via ORAL
  Filled 2022-07-13 (×5): qty 2

## 2022-07-13 NOTE — Progress Notes (Signed)
NAME:  Nathan Rowland, MRN:  [id number], DOB:  1980-08-12, LOS: 1 ADMISSION DATE:  07/11/2022, CONSULTATION DATE:  07/12/22 REFERRING MD:  Ashok Pall CHIEF COMPLAINT:  Chills, Dyspnea   History of Present Illness:  Nathan Rowland is a 42 y.o. 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 He is on daily Eliquis, last dose Sunday 5/12.  He presented to [facility] ED 5/12 with melena x 10 days. He had seen hematology 1 week prior and was given 1u PRBC for Hgb 7. In ED 5/12, Hgb noted to have dropped to 4.8.  He was admitted by [facility] and was started on PRBC transfusions. GI was consulted and small bowel enteroscopy was planned for 5/13. He had this performed and it showed type 1 GE varices without bleeding, grade III esophageal varices without bleeding. Capsule endoscopy started at 1305 on 5/13 and will end at 0105.  He had a total of 5u PRBC and after roughly 1 hour of the 5th unit, he developed sudden chills along with dyspnea and tachypnea. Of note, following his GI procedure, he started to have coughing spells. He did not receive general anesthesia, rather, he had MAC with O2 face mask only. He does not feel that he had any vomiting and is not sure that he aspirated but can't confirm (denies sensation of choking, but does feel ongoing cough and "dry throat").  He received epi pen empirically as well as 40mg  Lasix. PCCM was asked to see in consultation in case of TRALI. He has CTA pending to r/o PE (though doubtful given his anticoagulation use). He has had blood cultures drawn as part of sepsis workup and has also received 25mg  Benadryl and 1g Tylenol. He has also had transfusion reaction labs sent.  Pertinent  Medical History:  has EUSTACHIAN TUBE DYSFUNCTION, BILATERAL;  ALLERGIC RHINITIS; Injury of right toe; Administrative encounter; Concussion; Left shoulder pain; Rib pain; Portal vein thrombosis; Kidney calculus; Warts; Back strain; Iron deficiency anemia; Acute upper GI bleed; Blood loss anemia; Esophageal varices (HCC); Melena; and Acute hypoxic respiratory failure (HCC) on their problem list.  Significant Hospital Events: Including procedures, antibiotic start and stop dates in addition to other pertinent events   5/12 admit 5/13 small bowel enteroscopy, dyspnea and new O2 requirement along with chills after 5th unit PRBC, PCCM consulted.  Interim History / Subjective:  On 4L O2 via [place]. Able to speak in full sentences though does have coughing spells. Feels he has ongoing full body chills. Tachypneic to mid 20's. SBP 112.  Objective:  Blood pressure 110/68, pulse 100, temperature 97.7 F (36.5 C), temperature source Oral, resp. rate 19, height 6' (1.829 m), weight 108.9 kg, SpO2 100 %.        Intake/Output Summary (Last 24 hours) at 07/13/2022 1103 Last data filed at 07/13/2022 4098 Gross per 24 hour  Intake 850.27 ml  Output 1750 ml  Net -899.73 ml   Filed Weights   07/12/22 0507 07/12/22 1126  Weight: 108.5 kg 108.9 kg    Examination: Young male in no acute distress No JVD or lymphadenopathy is appreciated Lungs clear to auscultation Heart sounds are regular rate and rhythm Demand slightly distended Voids Has mild edema  Labs/imaging personally reviewed:  Small bowel enteroscopy 5/13 > type 1 GE varices without bleeding, grade III esophageal varices  without bleeding. CTA chest 5/13 > questionable pneumonia, small ascites in upper abdomen   Assessment & Plan:   Acute blood loss anemia - 2/2 melena/GIB in setting of chronic anticoagulation though unclear etiology. Small bowel enteroscopy negative. Capsule endoscopy started at 1305 on 5/13 and will end at 0105. He is s/p 5u PRBC with appropriate rise in Hgb from 4.8 to 8.9 most  recently. Per primary Menses per protocol Recent Labs    07/12/22 2217 07/13/22 0618  HGB 8.1* 7.5*     Acute hypoxic respiratory failure - some concern for aspiration. TRALI possible though not as common. Possible aspiration. Resolved currently on room air Continue antimicrobial therapy  Acute cough and mild hoarseness - unclear if had some vocal cord irritation/injury during GI procedure? (Of note, was not intubated). 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 He is on Eliquis. Per primary   Pulmonary critical care will sign off Best practice (evaluated daily):  Per primary team.  Labs   CBC: Recent Labs  Lab 07/11/22 1646 07/12/22 0048 07/12/22 0247 07/12/22 0636 07/12/22 0835 07/12/22 1513 07/12/22 1703 07/12/22 2217 07/13/22 0618  WBC 15.1*  --  13.5*  --  11.4*  --  10.9*  --  12.6*  HGB 4.8*   < > 3.8* 6.6* 7.1* 8.9* 9.5* 8.1* 7.5*  HCT 16.5*   < > 12.6* 20.6* 22.2*  --  30.3*  --  23.1*  MCV 93.2  --  95.5  --  89.5  --  90.4  --  87.8  PLT 406*  --  151  --  258  --  363  --  229   < > = values in this interval not displayed.    Basic Metabolic Panel: Recent Labs  Lab 07/11/22 1646 07/12/22 1703  NA 138 140  K 4.0 3.6  CL 109 111  CO2 21* 18*  GLUCOSE 132* 94  BUN 22* 15  CREATININE 0.98 0.96  CALCIUM 8.3* 7.7*   GFR: Estimated Creatinine Clearance: 131.7 mL/min (by C-G formula based on SCr of 0.96 mg/dL). Recent Labs  Lab 07/12/22 0247 07/12/22 0835 07/12/22 1703 07/13/22 0618  WBC 13.5* 11.4* 10.9* 12.6*    Liver Function Tests: Recent Labs  Lab 07/11/22 1646 07/12/22 1703  AST 19 20  ALT 20 17  ALKPHOS 46 40  BILITOT 0.4 1.0  PROT 5.3* 4.9*  ALBUMIN 2.9* 2.8*   No results for input(s): "LIPASE", "AMYLASE" in the last 168  hours. No results for input(s): "AMMONIA" in the last 168 hours.  ABG No results found for: "PHART", "PCO2ART", "PO2ART", "HCO3", "TCO2", "ACIDBASEDEF", "O2SAT"   Coagulation Profile: Recent Labs  Lab 07/12/22 0835  INR 1.2    Cardiac Enzymes: No results for input(s): "CKTOTAL", "CKMB", "CKMBINDEX", "TROPONINI" in the last 168 hours.  HbA1C: No results found for: "HGBA1C"  CBG: No results for input(s): "GLUCAP" in the last 168 hours.    Brett Canales Rondy Krupinski ACNP Acute Care Nurse Practitioner [facility] Please consult Amion 07/13/2022, 11:04 AM

## 2022-07-13 NOTE — Progress Notes (Signed)
PROGRESS NOTE        PATIENT DETAILS Name: Nathan Rowland Age: 42 y.o. Sex: male Date of Birth: 19-Dec-1980 Admit Date: 07/11/2022 Admitting Physician John Giovanni, MD WUJ:[id number], Dwana Curd, MD  Brief Summary: Patient is a 42 y.o.  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  Significant events: 5/12>> admit to [facility] 5/13>> EGD/enteroscopy-no obvious bleeding.  Developed chills/hypoxia post EGD-PCCM consulted-found to have left lower lung infiltrate on CT.  Significant studies: 5/13>> CT angio GI bleed: No of contrast extravasation to suggest active GI bleed. 5/13>> CT angio chest: Left lower lobe consolidation-several focal hypodense areas within the segmental/subsegmental right upper lobe/right lower lobe-indeterminate for small PE.  Significant microbiology data: 5/13>> blood cultures: No cultures.  Procedures: 5/13>> small bowel enteroscopy  Consults: GI PCCM  Subjective: Lying comfortably in bed-denies any chest pain or shortness of breath.  No melanotic stools overnight.  On room air this morning.  Objective: Vitals: Blood pressure 102/70, pulse 92, temperature 98.2 F (36.8 C), temperature source Oral, resp. rate 18, height 6' (1.829 m), weight 108.9 kg, SpO2 97 %.   Exam: Gen Exam:Alert awake-not in any distress HEENT:atraumatic, normocephalic Chest: B/L clear to auscultation anteriorly CVS:S1S2 regular Abdomen:soft non tender, non distended Extremities:no edema Neurology: Non focal Skin: no rash  Pertinent Labs/Radiology:    Latest Ref Rng & Units 07/13/2022    6:18 AM 07/12/2022   10:17 PM 07/12/2022    5:03 PM  CBC  WBC 4.0 - 10.5 K/uL 12.6   10.9   Hemoglobin 13.0 - 17.0 g/dL 7.5  8.1  9.5   Hematocrit 39.0 - 52.0 % 23.1   30.3   Platelets 150 -  400 K/uL 229   363     Lab Results  Component Value Date   NA 140 07/12/2022   K 3.6 07/12/2022   CL 111 07/12/2022   CO2 18 (L) 07/12/2022     Assessment/Plan: Upper GI bleeding with acute blood loss anemia No further bleeding overnight CTA abdomen 5/13 negative for active bleed Small bowel enteroscopy 5/13 negative for GI bleeding Pending results of video capsule endoscopy Hb downtrending but still relatively stable Follow and transfuse accordingly-5 units of PRBC transfused so far.  Acute hypoxic respiratory failure Aspiration pneumonia Hypoxia on 5/13-felt to be aspiration pneumonia-unlikely to be trolley Rapidly improved overnight-now on room air Continue empiric IV antibiotics.   Noncirrhotic portal hypertension with cavernous transformation/chronic occlusion of portal vein Splenomegaly/esophageal varices Possible small PE on CTA chest 5/13 Eliquis on hold Resume when okay with GI service  Obesity: Estimated body mass index is 32.55 kg/m as calculated from the following:   Height as of this encounter: 6' (1.829 m).   Weight as of this encounter: 108.9 kg.   Code status:   Code Status: Full Code   DVT Prophylaxis: SCDs Start: 07/12/22 0216   Family Communication: None at bedside   Disposition Plan: Status is: Inpatient Remains inpatient appropriate because: Severity of illness   Planned Discharge Destination:Home   Diet: Diet Order             Diet regular Room service appropriate? Yes with Assist; Fluid consistency: Thin  Diet effective now  Antimicrobial agents: Anti-infectives (From admission, onward)    Start     Dose/Rate Route Frequency Ordered Stop   07/12/22 1900  metroNIDAZOLE (FLAGYL) IVPB 500 mg        500 mg 100 mL/hr over 60 Minutes Intravenous Every 12 hours 07/12/22 1812     07/12/22 0200  cefTRIAXone (ROCEPHIN) 1 g in sodium chloride 0.9 % 100 mL IVPB        1 g 200 mL/hr over 30 Minutes Intravenous  Daily at 10 pm 07/12/22 0158          MEDICATIONS: Scheduled Meds:  sodium chloride   Intravenous Once   Continuous Infusions:  cefTRIAXone (ROCEPHIN)  IV 1 g (07/12/22 2249)   metronidazole 500 mg (07/13/22 0621)   PRN Meds:.   I have personally reviewed following labs and imaging studies  LABORATORY DATA: CBC: Recent Labs  Lab 07/11/22 1646 07/12/22 0048 07/12/22 0247 07/12/22 0636 07/12/22 0835 07/12/22 1513 07/12/22 1703 07/12/22 2217 07/13/22 0618  WBC 15.1*  --  13.5*  --  11.4*  --  10.9*  --  12.6*  HGB 4.8*   < > 3.8* 6.6* 7.1* 8.9* 9.5* 8.1* 7.5*  HCT 16.5*   < > 12.6* 20.6* 22.2*  --  30.3*  --  23.1*  MCV 93.2  --  95.5  --  89.5  --  90.4  --  87.8  PLT 406*  --  151  --  258  --  363  --  229   < > = values in this interval not displayed.    Basic Metabolic Panel: Recent Labs  Lab 07/11/22 1646 07/12/22 1703  NA 138 140  K 4.0 3.6  CL 109 111  CO2 21* 18*  GLUCOSE 132* 94  BUN 22* 15  CREATININE 0.98 0.96  CALCIUM 8.3* 7.7*    GFR: Estimated Creatinine Clearance: 131.7 mL/min (by C-G formula based on SCr of 0.96 mg/dL).  Liver Function Tests: Recent Labs  Lab 07/11/22 1646 07/12/22 1703  AST 19 20  ALT 20 17  ALKPHOS 46 40  BILITOT 0.4 1.0  PROT 5.3* 4.9*  ALBUMIN 2.9* 2.8*   No results for input(s): "LIPASE", "AMYLASE" in the last 168 hours. No results for input(s): "AMMONIA" in the last 168 hours.  Coagulation Profile: Recent Labs  Lab 07/12/22 0835  INR 1.2    Cardiac Enzymes: No results for input(s): "CKTOTAL", "CKMB", "CKMBINDEX", "TROPONINI" in the last 168 hours.  BNP (last 3 results) No results for input(s): "PROBNP" in the last 8760 hours.  Lipid Profile: No results for input(s): "CHOL", "HDL", "LDLCALC", "TRIG", "CHOLHDL", "LDLDIRECT" in the last 72 hours.  Thyroid Function Tests: No results for input(s): "TSH", "T4TOTAL", "FREET4", "T3FREE", "THYROIDAB" in the last 72 hours.  Anemia Panel: No results  for input(s): "VITAMINB12", "FOLATE", "FERRITIN", "TIBC", "IRON", "RETICCTPCT" in the last 72 hours.  Urine analysis:    Component Value Date/Time   COLORURINE STRAW (A) 07/12/2022 1631   APPEARANCEUR CLEAR 07/12/2022 1631   LABSPEC 1.018 07/12/2022 1631   PHURINE 5.0 07/12/2022 1631   GLUCOSEU NEGATIVE 07/12/2022 1631   HGBUR NEGATIVE 07/12/2022 1631   BILIRUBINUR NEGATIVE 07/12/2022 1631   BILIRUBINUR Neg 05/30/2012 1445   KETONESUR NEGATIVE 07/12/2022 1631   PROTEINUR NEGATIVE 07/12/2022 1631   UROBILINOGEN negative 05/30/2012 1445   NITRITE NEGATIVE 07/12/2022 1631   LEUKOCYTESUR NEGATIVE 07/12/2022 1631    Sepsis Labs: Lactic Acid, Venous No results found for: "LATICACIDVEN"  MICROBIOLOGY: Recent Results (from the  past 240 hour(s))  Culture, blood (Routine X 2) w Reflex to ID Panel     Status: None (Preliminary result)   Collection Time: 07/12/22  5:03 PM   Specimen: BLOOD RIGHT HAND  Result Value Ref Range Status   Specimen Description BLOOD RIGHT HAND  Final   Special Requests   Final    BOTTLES DRAWN AEROBIC AND ANAEROBIC Blood Culture results may not be optimal due to an inadequate volume of blood received in culture bottles   Culture   Final    NO GROWTH < 24 HOURS Performed at [facility], [address], [place], [place] [address]    Report Status PENDING  Incomplete  Culture, blood (Routine X 2) w Reflex to ID Panel     Status: None (Preliminary result)   Collection Time: 07/12/22  5:04 PM   Specimen: BLOOD  Result Value Ref Range Status   Specimen Description BLOOD RIGHT ANTECUBITAL  Final   Special Requests   Final    BOTTLES DRAWN AEROBIC AND ANAEROBIC Blood Culture adequate volume   Culture   Final    NO GROWTH < 24 HOURS Performed at [facility], [address] [place], [place] [address]    Report Status PENDING  Incomplete    RADIOLOGY STUDIES/RESULTS: CT Angio Chest Pulmonary Embolism (PE) W or WO Contrast  Result Date:  07/12/2022 CLINICAL DATA:  Dyspnea, hypoxia. EXAM: CT ANGIOGRAPHY CHEST WITH CONTRAST TECHNIQUE: Multidetector CT imaging of the chest was performed using the standard protocol during bolus administration of intravenous contrast. Multiplanar CT image reconstructions and MIPs were obtained to evaluate the vascular anatomy. RADIATION DOSE REDUCTION: This exam was performed according to the departmental dose-optimization program which includes automated exposure control, adjustment of the mA and/or kV according to patient size and/or use of iterative reconstruction technique. CONTRAST:  75mL OMNIPAQUE IOHEXOL 350 MG/ML SOLN COMPARISON:  CTA GI bleed 07/12/2022.  Chest CT 02/20/2021. FINDINGS: Cardiovascular: Examination is limited secondary to timing of the contrast bolus and respiratory motion artifact. There several focal hypodense areas seen within segmental and subsegmental levels of the right upper lobe and right lower lobe which are indeterminate for small pulmonary emboli for example axial image 5/66. The main pulmonary artery is normal in size. Heart and aorta are normal in size. There is no pericardial effusion. Mediastinum/Nodes: There are prominent, but nonenlarged left hilar lymph nodes. Visualized esophagus and thyroid gland are within normal limits. Lungs/Pleura: There is new dense airspace consolidation throughout the entire left lower lobe and minimally in the inferior left upper lobe. There are additional patchy ground-glass opacities throughout the left upper lobe. There is a opacification of left lower lobe bronchi. The right lung is clear. There is no pleural effusion or pneumothorax. Upper Abdomen: Small amount of ascites in the upper abdomen appears unchanged. Musculoskeletal: No chest wall abnormality. No acute or significant osseous findings. Review of the MIP images confirms the above findings. IMPRESSION: 1. Examination is limited secondary to timing of the contrast bolus and respiratory  motion artifact. There are several focal hypodense areas seen within segmental and subsegmental levels of the right upper lobe and right lower lobe which are indeterminate for small pulmonary emboli. No evidence for right heart strain. 2. New dense airspace consolidation throughout the entire left lower lobe and minimally in the inferior left upper lobe. There is opacification of left lower lobe bronchi. Findings are concerning for pneumonia. Follow-up imaging recommended to confirm resolution. 3. Stable small amount of  ascites in the upper abdomen. Electronically Signed   By: Darliss Cheney M.D.   On: 07/12/2022 18:02   DG CHEST PORT 1 VIEW  Result Date: 07/12/2022 CLINICAL DATA:  Dyspnea. EXAM: PORTABLE CHEST 1 VIEW COMPARISON:  AP chest 02/20/2021 and CT chest 02/20/2021 FINDINGS: Cardiac silhouette and mediastinal contours within normal limits. There is a new heterogeneous airspace opacification of the left mid and lower lung. The right lung appears clear. No definite pleural effusion. No pneumothorax. No acute skeletal abnormality. IMPRESSION: New heterogeneous airspace opacification of the left mid and lower lung, concerning for pneumonia. Note is made that on the CT abdomen and pelvis and frontal scout view of the CT approximately 7 hours earlier today at the left lower lung appears more clear. Question pneumonia versus recent aspiration. Electronically Signed   By: Neita Garnet M.D.   On: 07/12/2022 17:01   CT ANGIO GI BLEED  Result Date: 07/12/2022 CLINICAL DATA:  Evaluate for GI bleed.  Anemia. EXAM: CTA ABDOMEN AND PELVIS WITHOUT AND WITH CONTRAST TECHNIQUE: Multidetector CT imaging of the abdomen and pelvis was performed using the standard protocol during bolus administration of intravenous contrast. Multiplanar reconstructed images and MIPs were obtained and reviewed to evaluate the vascular anatomy. RADIATION DOSE REDUCTION: This exam was performed according to the departmental  dose-optimization program which includes automated exposure control, adjustment of the mA and/or kV according to patient size and/or use of iterative reconstruction technique. CONTRAST:  OMNIPAQUE IOHEXOL 350 MG/ML SOLN COMPARISON:  05/07/2018 for FINDINGS: VASCULAR Aorta: Normal caliber aorta without aneurysm, dissection, vasculitis or significant stenosis. Celiac: Patent without evidence of aneurysm, dissection, vasculitis or significant stenosis. SMA: Patent without evidence of aneurysm, dissection, vasculitis or significant stenosis. Renals: Both renal arteries are patent without evidence of aneurysm, dissection, vasculitis, fibromuscular dysplasia or significant stenosis. IMA: Patent without evidence of aneurysm, dissection, vasculitis or significant stenosis. Inflow: Patent without evidence of aneurysm, dissection, vasculitis or significant stenosis. Proximal Outflow: Bilateral common femoral and visualized portions of the superficial and profunda femoral arteries are patent without evidence of aneurysm, dissection, vasculitis or significant stenosis. 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 Review of the MIP images confirms the above findings. NON-VASCULAR Lower chest: No pleural effusion or airspace consolidation. Platelike atelectasis identified within the right middle lobe, lingula and both lower lobes Hepatobiliary: Multiple too small to characterize low-density liver lesions are again seen and appears similar to previous exam. Gallbladder appears normal. No wall thickening, inflammation or biliary dilatation. Pancreas: Unremarkable. No pancreatic ductal dilatation or surrounding inflammatory changes. Spleen: Mild splenomegaly measuring 13.6 cm. Stable from previous exam. Adrenals/Urinary Tract: Normal appearance of the kidneys. No hydronephrosis or mass. Punctate stone within interpolar collecting  system of the left kidney noted, image 110/11. Stomach/Bowel: No signs of intraluminal contrast extravasation within the small or large bowel loops to suggest active GI bleed. Stomach appears normal. The appendix is visualized and is within normal limits. No bowel wall thickening, inflammation, or distension. Colonic diverticulosis noted. No signs of acute diverticulitis. Lymphatic: No enlarged abdominopelvic lymph nodes. Reproductive: Prostate is unremarkable. Other: New abdominal ascites identified with free fluid extending over the liver and spleen. No discrete fluid collections. No signs of hematoma. Musculoskeletal: No acute or significant osseous findings. IMPRESSION: 1. No signs of intraluminal contrast extravasation within the small or large bowel loops to suggest active GI bleed. 2. 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 Esophageal and  varices are identified 3. New abdominal ascites. Findings may reflect progressive portal venous hypertension. 4. Splenomegaly.  Stable from previous exam 5. Nonobstructing left renal calculus. Electronically Signed   By: Signa Kell M.D.   On: 07/12/2022 09:44     LOS: 1 day   Jeoffrey Massed, MD  [facility]    To contact the attending provider between 7A-7P or the covering provider during after hours 7P-7A, please log into the web site [email or web address] and access using universal [place] password for that web site. If you do not have the password, please call the hospital operator.  07/13/2022, 8:36 AM

## 2022-07-13 NOTE — H&P (View-Only) (Signed)
[facility] Progress Note  Chief Complaint: Obscure overt GI bleeding with melena and marked blood loss anemia  History:  Events of yesterday afternoon were noted, particularly respiratory distress and hypotension with hypoxia.  Chest x-ray showed new left lung infiltrate.  His coughing is subsided and his breathing improved today.  He is breathing comfortably on room air at the moment. Nathan Rowland reports having had a black stool last evening.  He denies abdominal pain nausea or vomiting.  No hematemesis.   ROS: Cardiovascular: No chest pain at present Respiratory: Nonproductive cough Urinary: No dysuria  Objective:   Current Facility-Administered Medications:    0.9 %  sodium chloride infusion (Manually program via Guardrails IV Fluids), , Intravenous, Once, Danis, Starr Lake III, MD   acetaminophen (TYLENOL) tablet 650 mg, 650 mg, Oral, Q6H PRN, Maretta Bees, MD, 650 mg at 07/13/22 0916   cefTRIAXone (ROCEPHIN) 1 g in sodium chloride 0.9 % 100 mL IVPB, 1 g, Intravenous, Q2200, Charlie Pitter III, MD, Last Rate: 200 mL/hr at 07/12/22 2249, 1 g at 07/12/22 2249   metroNIDAZOLE (FLAGYL) IVPB 500 mg, 500 mg, Intravenous, Q12H, Wouk, Wilfred Curtis, MD, Last Rate: 100 mL/hr at 07/13/22 0621, 500 mg at 07/13/22 0621   ondansetron (ZOFRAN) injection 4 mg, 4 mg, Intravenous, Q6H PRN, Ghimire, Werner Lean, MD   peg 3350 powder (MOVIPREP) kit 100 g, 0.5 kit, Oral, Once **AND** peg 3350 powder (MOVIPREP) kit 100 g, 0.5 kit, Oral, Once, Pierce, Dwayne A, RPH  Facility-Administered Medications Ordered in Other Encounters:    acetaminophen (TYLENOL) tablet 650 mg, 650 mg, Oral, Once, Desma Mcgregor, RPH   diphenhydrAMINE (BENADRYL) capsule 25 mg, 25 mg, Oral, Once, Desma Mcgregor, RPH   iron sucrose (VENOFER) 200 mg in sodium chloride 0.9 % 100 mL IVPB, 200 mg, Intravenous, Once, Desma Mcgregor, RPH   cefTRIAXone (ROCEPHIN)  IV 1 g (07/12/22 2249)   metronidazole 500 mg (07/13/22 1610)     Vital signs in  last 24 hrs: Vitals:   07/13/22 1217 07/13/22 1218  BP:  123/67  Pulse: (!) 107 98  Resp: 18 19  Temp:  98.3 F (36.8 C)  SpO2: 92% 93%    Intake/Output Summary (Last 24 hours) at 07/13/2022 1233 Last data filed at 07/13/2022 9604 Gross per 24 hour  Intake 750.27 ml  Output 1750 ml  Net -999.73 ml    Nathan Rowland's parents are at the bedside for my entire visit.  His nurse was also present. Physical Exam  HEENT: sclera anicteric, oral mucosa without lesions Neck: supple, no thyromegaly, JVD or lymphadenopathy Cardiac: RRR without murmurs, S1S2 heard, no peripheral edema Pulm: Good air entry bilaterally.  Faint crackles left side, normal RR and effort noted Abdomen: soft, no tenderness, with active bowel sounds. No guarding or palpable hepatosplenomegaly Skin; warm and dry, no jaundice.  Pale  Recent Labs:     Latest Ref Rng & Units 07/13/2022    6:18 AM 07/12/2022   10:17 PM 07/12/2022    5:03 PM  CBC  WBC 4.0 - 10.5 K/uL 12.6   10.9   Hemoglobin 13.0 - 17.0 g/dL 7.5  8.1  9.5   Hematocrit 39.0 - 52.0 % 23.1   30.3   Platelets 150 - 400 K/uL 229   363     Recent Labs  Lab 07/12/22 0835  INR 1.2      Latest Ref Rng & Units 07/13/2022    6:18 AM 07/12/2022    5:03 PM 07/11/2022  4:46 PM  CMP  Glucose 70 - 99 mg/dL 962  94  952   BUN 6 - 20 mg/dL 15  15  22    Creatinine 0.61 - 1.24 mg/dL 8.41  3.24  4.01   Sodium 135 - 145 mmol/L 137  140  138   Potassium 3.5 - 5.1 mmol/L 3.4  3.6  4.0   Chloride 98 - 111 mmol/L 107  111  109   CO2 22 - 32 mmol/L 23  18  21    Calcium 8.9 - 10.3 mg/dL 7.4  7.7  8.3   Total Protein 6.5 - 8.1 g/dL 4.3  4.9  5.3   Total Bilirubin 0.3 - 1.2 mg/dL 0.7  1.0  0.4   Alkaline Phos 38 - 126 U/L 36  40  46   AST 15 - 41 U/L 17  20  19    ALT 0 - 44 U/L 17  17  20       Radiologic studies: CLINICAL DATA:  Dyspnea, hypoxia.   EXAM: CT ANGIOGRAPHY CHEST WITH CONTRAST   TECHNIQUE: Multidetector CT imaging of the chest was performed using  the standard protocol during bolus administration of intravenous contrast. Multiplanar CT image reconstructions and MIPs were obtained to evaluate the vascular anatomy.   RADIATION DOSE REDUCTION: This exam was performed according to the departmental dose-optimization program which includes automated exposure control, adjustment of the mA and/or kV according to patient size and/or use of iterative reconstruction technique.   CONTRAST:  75mL OMNIPAQUE IOHEXOL 350 MG/ML SOLN   COMPARISON:  CTA GI bleed 07/12/2022.  Chest CT 02/20/2021.   FINDINGS: Cardiovascular: Examination is limited secondary to timing of the contrast bolus and respiratory motion artifact. There several focal hypodense areas seen within segmental and subsegmental levels of the right upper lobe and right lower lobe which are indeterminate for small pulmonary emboli for example axial image 5/66. The main pulmonary artery is normal in size. Heart and aorta are normal in size. There is no pericardial effusion.   Mediastinum/Nodes: There are prominent, but nonenlarged left hilar lymph nodes. Visualized esophagus and thyroid gland are within normal limits.   Lungs/Pleura: There is new dense airspace consolidation throughout the entire left lower lobe and minimally in the inferior left upper lobe. There are additional patchy ground-glass opacities throughout the left upper lobe. There is a opacification of left lower lobe bronchi. The right lung is clear. There is no pleural effusion or pneumothorax.   Upper Abdomen: Small amount of ascites in the upper abdomen appears unchanged.   Musculoskeletal: No chest wall abnormality. No acute or significant osseous findings.   Review of the MIP images confirms the above findings.   IMPRESSION: 1. Examination is limited secondary to timing of the contrast bolus and respiratory motion artifact. There are several focal hypodense areas seen within segmental and subsegmental  levels of the right upper lobe and right lower lobe which are indeterminate for small pulmonary emboli. No evidence for right heart strain. 2. New dense airspace consolidation throughout the entire left lower lobe and minimally in the inferior left upper lobe. There is opacification of left lower lobe bronchi. Findings are concerning for pneumonia. Follow-up imaging recommended to confirm resolution. 3. Stable small amount of ascites in the upper abdomen.     Electronically Signed   By: Darliss Cheney M.D.   On: 07/12/2022 18:02  _________________________  CLINICAL DATA:  Evaluate for GI bleed.  Anemia.   EXAM: CTA ABDOMEN AND PELVIS WITHOUT AND WITH  CONTRAST   TECHNIQUE: Multidetector CT imaging of the abdomen and pelvis was performed using the standard protocol during bolus administration of intravenous contrast. Multiplanar reconstructed images and MIPs were obtained and reviewed to evaluate the vascular anatomy.   RADIATION DOSE REDUCTION: This exam was performed according to the departmental dose-optimization program which includes automated exposure control, adjustment of the mA and/or kV according to patient size and/or use of iterative reconstruction technique.   CONTRAST:  OMNIPAQUE IOHEXOL 350 MG/ML SOLN   COMPARISON:  05/07/2018 for   FINDINGS: VASCULAR   Aorta: Normal caliber aorta without aneurysm, dissection, vasculitis or significant stenosis.   Celiac: Patent without evidence of aneurysm, dissection, vasculitis or significant stenosis.   SMA: Patent without evidence of aneurysm, dissection, vasculitis or significant stenosis.   Renals: Both renal arteries are patent without evidence of aneurysm, dissection, vasculitis, fibromuscular dysplasia or significant stenosis.   IMA: Patent without evidence of aneurysm, dissection, vasculitis or significant stenosis.   Inflow: Patent without evidence of aneurysm, dissection, vasculitis or significant  stenosis.   Proximal Outflow: Bilateral common femoral and visualized portions of the superficial and profunda femoral arteries are patent without evidence of aneurysm, dissection, vasculitis or significant stenosis.   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   Review of the MIP images confirms the above findings.   NON-VASCULAR   Lower chest: No pleural effusion or airspace consolidation. Platelike atelectasis identified within the right middle lobe, lingula and both lower lobes   Hepatobiliary: Multiple too small to characterize low-density liver lesions are again seen and appears similar to previous exam. Gallbladder appears normal. No wall thickening, inflammation or biliary dilatation.   Pancreas: Unremarkable. No pancreatic ductal dilatation or surrounding inflammatory changes.   Spleen: Mild splenomegaly measuring 13.6 cm. Stable from previous exam.   Adrenals/Urinary Tract: Normal appearance of the kidneys. No hydronephrosis or mass. Punctate stone within interpolar collecting system of the left kidney noted, image 110/11.   Stomach/Bowel: No signs of intraluminal contrast extravasation within the small or large bowel loops to suggest active GI bleed.   Stomach appears normal. The appendix is visualized and is within normal limits. No bowel wall thickening, inflammation, or distension. Colonic diverticulosis noted. No signs of acute diverticulitis.   Lymphatic: No enlarged abdominopelvic lymph nodes.   Reproductive: Prostate is unremarkable.   Other: New abdominal ascites identified with free fluid extending over the liver and spleen. No discrete fluid collections. No signs of hematoma.   Musculoskeletal: No acute or significant osseous findings.   IMPRESSION: 1. No signs of intraluminal contrast extravasation within the small or large bowel loops to suggest  active GI bleed. 2. 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 New abdominal ascites. Findings may reflect progressive portal venous hypertension. 4. Splenomegaly.  Stable from previous exam 5. Nonobstructing left renal calculus.     Electronically Signed   By: Signa Kell M.D.   On: 07/12/2022 09:44   Assessment & Plan  Assessment: Melena Acute on chronic blood loss anemia. 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  No clot on most recent CT angiogram.  CT angiogram this admission showed no bleeding to the chest cavity, abdominal cavity or retroperitoneal space.  Aspiration pneumonia from small bowel enteroscopy yesterday.  Fortunately, his respiratory status has improved.   This remains a very challenging case and there is the ongoing possible of  the source of his obscure GI bleeding. I reviewed his video capsule study this morning, and there was no fresh or old blood seen throughout the upper digestive tract or entirety of the small bowel.  There was what appeared to be melena on the limited colon images.  No source of small bowel bleeding was seen, given the inherent limitations of some mucosa obscured by retained fluid and debris. There were several areas of a subtle nonspecific bluish discoloration of the mucosa or submucosa.  Nonspecific finding, but raises suspicion for small bowel varices given his known severe portal hypertension that has been worsening in recent months as evidenced by slowly progressive splenomegaly and new development of ascites.  There was no apparent stigmata of bleeding on his enteroscopy yesterday, particularly in the area of esophageal gastric varices.  There was also no fresh or even flecks of old blood in that area to suggest that as a source.  His colonoscopy in January of this year for iron deficiency anemia and some black stool  that had occurred the month prior to that was an excellent preparation and complete to the ileum with no abnormalities.  It is possible something is developed in the colon in the interim.  I had a long discussion with Nathan Rowland and his family with his nurse present about where we go from here.  He needs a colonoscopy tomorrow to completely rule out the interval development of some bleeding source since his last exam in January of this year. In addition, the underlying problem of his chronically occluded portal vein with cavernous transformation needs to be addressed in some fashion if possible.  To my knowledge (and also from experience with a similar patient in the past) this is not amenable to vascular bypass or other surgical intervention.  I have previously mentioned in my consult note as well as office notes communicated with Dr. Marliss Coots of interventional radiology about the possibility of a Laveda Norman splenic approach to the splenic and then portal venous system to see if it is technically feasible to place a stent and thereby decrease his portal hypertension.  I will revisit that with interventional radiology, and also make some calls to discuss the case with GI and IR colleagues at academic institutions in the area.  Nathan Rowland was agreeable to the colonoscopy tomorrow.  The benefits and risks of the planned procedure were described in detail with the patient or (when appropriate) their health care proxy.  Risks were outlined as including, but not limited to, bleeding, infection, perforation, adverse medication reaction leading to cardiac or pulmonary decompensation, pancreatitis (if ERCP).  The limitation of incomplete mucosal visualization was also discussed.  No guarantees or warranties were given.   40 minutes were spent on this encounter (including chart review, history/exam, counseling/coordination of care, and documentation) > 50% of that time was spent on counseling and coordination of  care.  Charlie Pitter III Office: [phone]

## 2022-07-13 NOTE — Progress Notes (Signed)
[facility] Progress Note  Chief Complaint: Obscure overt GI bleeding with melena and marked blood loss anemia  History:  Events of yesterday afternoon were noted, particularly respiratory distress and hypotension with hypoxia.  Chest x-ray showed new left lung infiltrate.  His coughing is subsided and his breathing improved today.  He is breathing comfortably on room air at the moment. Kele reports having had a black stool last evening.  He denies abdominal pain nausea or vomiting.  No hematemesis.   ROS: Cardiovascular: No chest pain at present Respiratory: Nonproductive cough Urinary: No dysuria  Objective:   Current Facility-Administered Medications:    0.9 %  sodium chloride infusion (Manually program via Guardrails IV Fluids), , Intravenous, Once, Danis, Tamika Nou L III, MD   acetaminophen (TYLENOL) tablet 650 mg, 650 mg, Oral, Q6H PRN, Ghimire, Shanker M, MD, 650 mg at 07/13/22 0916   cefTRIAXone (ROCEPHIN) 1 g in sodium chloride 0.9 % 100 mL IVPB, 1 g, Intravenous, Q2200, Danis, Adileny Delon L III, MD, Last Rate: 200 mL/hr at 07/12/22 2249, 1 g at 07/12/22 2249   metroNIDAZOLE (FLAGYL) IVPB 500 mg, 500 mg, Intravenous, Q12H, Wouk, Noah Bedford, MD, Last Rate: 100 mL/hr at 07/13/22 0621, 500 mg at 07/13/22 0621   ondansetron (ZOFRAN) injection 4 mg, 4 mg, Intravenous, Q6H PRN, Ghimire, Shanker M, MD   peg 3350 powder (MOVIPREP) kit 100 g, 0.5 kit, Oral, Once **AND** peg 3350 powder (MOVIPREP) kit 100 g, 0.5 kit, Oral, Once, Pierce, Dwayne A, RPH  Facility-Administered Medications Ordered in Other Encounters:    acetaminophen (TYLENOL) tablet 650 mg, 650 mg, Oral, Once, Patel, Yatin, RPH   diphenhydrAMINE (BENADRYL) capsule 25 mg, 25 mg, Oral, Once, Patel, Yatin, RPH   iron sucrose (VENOFER) 200 mg in sodium chloride 0.9 % 100 mL IVPB, 200 mg, Intravenous, Once, Patel, Yatin, RPH   cefTRIAXone (ROCEPHIN)  IV 1 g (07/12/22 2249)   metronidazole 500 mg (07/13/22 0621)     Vital signs in  last 24 hrs: Vitals:   07/13/22 1217 07/13/22 1218  BP:  123/67  Pulse: (!) 107 98  Resp: 18 19  Temp:  98.3 F (36.8 C)  SpO2: 92% 93%    Intake/Output Summary (Last 24 hours) at 07/13/2022 1233 Last data filed at 07/13/2022 0621 Gross per 24 hour  Intake 750.27 ml  Output 1750 ml  Net -999.73 ml    Jonty's parents are at the bedside for my entire visit.  His nurse was also present. Physical Exam  HEENT: sclera anicteric, oral mucosa without lesions Neck: supple, no thyromegaly, JVD or lymphadenopathy Cardiac: RRR without murmurs, S1S2 heard, no peripheral edema Pulm: Good air entry bilaterally.  Faint crackles left side, normal RR and effort noted Abdomen: soft, no tenderness, with active bowel sounds. No guarding or palpable hepatosplenomegaly Skin; warm and dry, no jaundice.  Pale  Recent Labs:     Latest Ref Rng & Units 07/13/2022    6:18 AM 07/12/2022   10:17 PM 07/12/2022    5:03 PM  CBC  WBC 4.0 - 10.5 K/uL 12.6   10.9   Hemoglobin 13.0 - 17.0 g/dL 7.5  8.1  9.5   Hematocrit 39.0 - 52.0 % 23.1   30.3   Platelets 150 - 400 K/uL 229   363     Recent Labs  Lab 07/12/22 0835  INR 1.2      Latest Ref Rng & Units 07/13/2022    6:18 AM 07/12/2022    5:03 PM 07/11/2022      4:46 PM  CMP  Glucose 70 - 99 mg/dL 114  94  132   BUN 6 - 20 mg/dL 15  15  22   Creatinine 0.61 - 1.24 mg/dL 1.04  0.96  0.98   Sodium 135 - 145 mmol/L 137  140  138   Potassium 3.5 - 5.1 mmol/L 3.4  3.6  4.0   Chloride 98 - 111 mmol/L 107  111  109   CO2 22 - 32 mmol/L 23  18  21   Calcium 8.9 - 10.3 mg/dL 7.4  7.7  8.3   Total Protein 6.5 - 8.1 g/dL 4.3  4.9  5.3   Total Bilirubin 0.3 - 1.2 mg/dL 0.7  1.0  0.4   Alkaline Phos 38 - 126 U/L 36  40  46   AST 15 - 41 U/L 17  20  19   ALT 0 - 44 U/L 17  17  20      Radiologic studies: CLINICAL DATA:  Dyspnea, hypoxia.   EXAM: CT ANGIOGRAPHY CHEST WITH CONTRAST   TECHNIQUE: Multidetector CT imaging of the chest was performed using  the standard protocol during bolus administration of intravenous contrast. Multiplanar CT image reconstructions and MIPs were obtained to evaluate the vascular anatomy.   RADIATION DOSE REDUCTION: This exam was performed according to the departmental dose-optimization program which includes automated exposure control, adjustment of the mA and/or kV according to patient size and/or use of iterative reconstruction technique.   CONTRAST:  75mL OMNIPAQUE IOHEXOL 350 MG/ML SOLN   COMPARISON:  CTA GI bleed 07/12/2022.  Chest CT 02/20/2021.   FINDINGS: Cardiovascular: Examination is limited secondary to timing of the contrast bolus and respiratory motion artifact. There several focal hypodense areas seen within segmental and subsegmental levels of the right upper lobe and right lower lobe which are indeterminate for small pulmonary emboli for example axial image 5/66. The main pulmonary artery is normal in size. Heart and aorta are normal in size. There is no pericardial effusion.   Mediastinum/Nodes: There are prominent, but nonenlarged left hilar lymph nodes. Visualized esophagus and thyroid gland are within normal limits.   Lungs/Pleura: There is new dense airspace consolidation throughout the entire left lower lobe and minimally in the inferior left upper lobe. There are additional patchy ground-glass opacities throughout the left upper lobe. There is a opacification of left lower lobe bronchi. The right lung is clear. There is no pleural effusion or pneumothorax.   Upper Abdomen: Small amount of ascites in the upper abdomen appears unchanged.   Musculoskeletal: No chest wall abnormality. No acute or significant osseous findings.   Review of the MIP images confirms the above findings.   IMPRESSION: 1. Examination is limited secondary to timing of the contrast bolus and respiratory motion artifact. There are several focal hypodense areas seen within segmental and subsegmental  levels of the right upper lobe and right lower lobe which are indeterminate for small pulmonary emboli. No evidence for right heart strain. 2. New dense airspace consolidation throughout the entire left lower lobe and minimally in the inferior left upper lobe. There is opacification of left lower lobe bronchi. Findings are concerning for pneumonia. Follow-up imaging recommended to confirm resolution. 3. Stable small amount of ascites in the upper abdomen.     Electronically Signed   By: Amy  Guttmann M.D.   On: 07/12/2022 18:02  _________________________  CLINICAL DATA:  Evaluate for GI bleed.  Anemia.   EXAM: CTA ABDOMEN AND PELVIS WITHOUT AND WITH   CONTRAST   TECHNIQUE: Multidetector CT imaging of the abdomen and pelvis was performed using the standard protocol during bolus administration of intravenous contrast. Multiplanar reconstructed images and MIPs were obtained and reviewed to evaluate the vascular anatomy.   RADIATION DOSE REDUCTION: This exam was performed according to the departmental dose-optimization program which includes automated exposure control, adjustment of the mA and/or kV according to patient size and/or use of iterative reconstruction technique.   CONTRAST:  100mL OMNIPAQUE IOHEXOL 350 MG/ML SOLN   COMPARISON:  05/07/2018 for   FINDINGS: VASCULAR   Aorta: Normal caliber aorta without aneurysm, dissection, vasculitis or significant stenosis.   Celiac: Patent without evidence of aneurysm, dissection, vasculitis or significant stenosis.   SMA: Patent without evidence of aneurysm, dissection, vasculitis or significant stenosis.   Renals: Both renal arteries are patent without evidence of aneurysm, dissection, vasculitis, fibromuscular dysplasia or significant stenosis.   IMA: Patent without evidence of aneurysm, dissection, vasculitis or significant stenosis.   Inflow: Patent without evidence of aneurysm, dissection, vasculitis or significant  stenosis.   Proximal Outflow: Bilateral common femoral and visualized portions of the superficial and profunda femoral arteries are patent without evidence of aneurysm, dissection, vasculitis or significant stenosis.   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   Review of the MIP images confirms the above findings.   NON-VASCULAR   Lower chest: No pleural effusion or airspace consolidation. Platelike atelectasis identified within the right middle lobe, lingula and both lower lobes   Hepatobiliary: Multiple too small to characterize low-density liver lesions are again seen and appears similar to previous exam. Gallbladder appears normal. No wall thickening, inflammation or biliary dilatation.   Pancreas: Unremarkable. No pancreatic ductal dilatation or surrounding inflammatory changes.   Spleen: Mild splenomegaly measuring 13.6 cm. Stable from previous exam.   Adrenals/Urinary Tract: Normal appearance of the kidneys. No hydronephrosis or mass. Punctate stone within interpolar collecting system of the left kidney noted, image 110/11.   Stomach/Bowel: No signs of intraluminal contrast extravasation within the small or large bowel loops to suggest active GI bleed.   Stomach appears normal. The appendix is visualized and is within normal limits. No bowel wall thickening, inflammation, or distension. Colonic diverticulosis noted. No signs of acute diverticulitis.   Lymphatic: No enlarged abdominopelvic lymph nodes.   Reproductive: Prostate is unremarkable.   Other: New abdominal ascites identified with free fluid extending over the liver and spleen. No discrete fluid collections. No signs of hematoma.   Musculoskeletal: No acute or significant osseous findings.   IMPRESSION: 1. No signs of intraluminal contrast extravasation within the small or large bowel loops to suggest  active GI bleed. 2. 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 New abdominal ascites. Findings may reflect progressive portal venous hypertension. 4. Splenomegaly.  Stable from previous exam 5. Nonobstructing left renal calculus.     Electronically Signed   By: Taylor  Stroud M.D.   On: 07/12/2022 09:44   Assessment & Plan  Assessment: Melena Acute on chronic blood loss anemia. 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  No clot on most recent CT angiogram.  CT angiogram this admission showed no bleeding to the chest cavity, abdominal cavity or retroperitoneal space.  Aspiration pneumonia from small bowel enteroscopy yesterday.  Fortunately, his respiratory status has improved.   This remains a very challenging case and there is the ongoing possible of   the source of his obscure GI bleeding. I reviewed his video capsule study this morning, and there was no fresh or old blood seen throughout the upper digestive tract or entirety of the small bowel.  There was what appeared to be melena on the limited colon images.  No source of small bowel bleeding was seen, given the inherent limitations of some mucosa obscured by retained fluid and debris. There were several areas of a subtle nonspecific bluish discoloration of the mucosa or submucosa.  Nonspecific finding, but raises suspicion for small bowel varices given his known severe portal hypertension that has been worsening in recent months as evidenced by slowly progressive splenomegaly and new development of ascites.  There was no apparent stigmata of bleeding on his enteroscopy yesterday, particularly in the area of esophageal gastric varices.  There was also no fresh or even flecks of old blood in that area to suggest that as a source.  His colonoscopy in January of this year for iron deficiency anemia and some black stool  that had occurred the month prior to that was an excellent preparation and complete to the ileum with no abnormalities.  It is possible something is developed in the colon in the interim.  I had a long discussion with Dequavius and his family with his nurse present about where we go from here.  He needs a colonoscopy tomorrow to completely rule out the interval development of some bleeding source since his last exam in January of this year. In addition, the underlying problem of his chronically occluded portal vein with cavernous transformation needs to be addressed in some fashion if possible.  To my knowledge (and also from experience with a similar patient in the past) this is not amenable to vascular bypass or other surgical intervention.  I have previously mentioned in my consult note as well as office notes communicated with Dr. Dylan Suttle of interventional radiology about the possibility of a Tran splenic approach to the splenic and then portal venous system to see if it is technically feasible to place a stent and thereby decrease his portal hypertension.  I will revisit that with interventional radiology, and also make some calls to discuss the case with GI and IR colleagues at academic institutions in the area.  Josh was agreeable to the colonoscopy tomorrow.  The benefits and risks of the planned procedure were described in detail with the patient or (when appropriate) their health care proxy.  Risks were outlined as including, but not limited to, bleeding, infection, perforation, adverse medication reaction leading to cardiac or pulmonary decompensation, pancreatitis (if ERCP).  The limitation of incomplete mucosal visualization was also discussed.  No guarantees or warranties were given.   40 minutes were spent on this encounter (including chart review, history/exam, counseling/coordination of care, and documentation) > 50% of that time was spent on counseling and coordination of  care.  Ailine Hefferan L Danis III Office: [phone]  

## 2022-07-13 NOTE — Anesthesia Preprocedure Evaluation (Signed)
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   Endo/Other  negative endocrine ROS    Renal/GU negative Renal ROS     Musculoskeletal   Abdominal  (+) + obese  Peds  Hematology  (+) Blood dyscrasia (iron deficiency), anemia   Anesthesia Other Findings Last Eliquis: 07/11/2022  Hgb 7.1 after 4 units pRBCs. 1 unit pRBC being started in preop.  Reproductive/Obstetrics                             Anesthesia Physical Anesthesia Plan  ASA: 4  Anesthesia Plan: MAC   Post-op Pain Management: Minimal or no pain anticipated   Induction: Intravenous  PONV Risk Score and Plan: 1 and Propofol infusion and Treatment may vary due to age or medical condition  Airway Management Planned: Natural Airway and Simple Face Mask  Additional Equipment: None  Intra-op Plan:   Post-operative Plan:   Informed Consent: I have reviewed the patients History and Physical, chart, labs and discussed the procedure including the risks, benefits and alternatives for the proposed anesthesia with the patient or authorized representative who has indicated his/her understanding and acceptance.     Dental advisory given  Plan Discussed with: CRNA and  Anesthesiologist  Anesthesia Plan Comments: (Discussed with patient risks of MAC including, but not limited to, minor pain or discomfort, hearing people in the room, and possible need for backup general anesthesia. Risks for general anesthesia also discussed including, but not limited to, sore throat, hoarse voice, chipped/damaged teeth, injury to vocal cords, nausea and vomiting, allergic reactions, lung infection, heart attack, stroke, and death. All questions answered. )       Anesthesia Quick Evaluation

## 2022-07-13 NOTE — Care Management (Signed)
  Transition of Care [facility]) Screening Note   Patient Details  Name: Nathan Rowland Date of Birth: 16-May-1980   Transition of Care [facility]) CM/SW Contact:    Lawerance Sabal, RN Phone Number: 07/13/2022, 3:52 PM    Transition of Care Department [facility]) has reviewed patient and we will continue to monitor patient advancement through interdisciplinary progression rounds. If new patient transition needs arise, please place a TOC consult.

## 2022-07-14 ENCOUNTER — Inpatient Hospital Stay (HOSPITAL_COMMUNITY): Payer: BC Managed Care – PPO | Admitting: Anesthesiology

## 2022-07-14 ENCOUNTER — Encounter (HOSPITAL_COMMUNITY): Payer: Self-pay | Admitting: Internal Medicine

## 2022-07-14 ENCOUNTER — Encounter (HOSPITAL_COMMUNITY)
Admission: EM | Disposition: A | Discharge status: Home / Self Care | Payer: Self-pay | Source: Home / Self Care | Attending: Internal Medicine

## 2022-07-14 DIAGNOSIS — D500 Iron deficiency anemia secondary to blood loss (chronic): Secondary | ICD-10-CM | POA: Diagnosis not present

## 2022-07-14 DIAGNOSIS — K766 Portal hypertension: Secondary | ICD-10-CM

## 2022-07-14 DIAGNOSIS — D62 Acute posthemorrhagic anemia: Secondary | ICD-10-CM

## 2022-07-14 DIAGNOSIS — E669 Obesity, unspecified: Secondary | ICD-10-CM | POA: Diagnosis not present

## 2022-07-14 DIAGNOSIS — K921 Melena: Secondary | ICD-10-CM

## 2022-07-14 DIAGNOSIS — Z7901 Long term (current) use of anticoagulants: Secondary | ICD-10-CM | POA: Diagnosis not present

## 2022-07-14 DIAGNOSIS — K922 Gastrointestinal hemorrhage, unspecified: Secondary | ICD-10-CM | POA: Diagnosis not present

## 2022-07-14 DIAGNOSIS — Z6832 Body mass index (BMI) 32.0-32.9, adult: Secondary | ICD-10-CM | POA: Diagnosis not present

## 2022-07-14 HISTORY — PX: COLONOSCOPY WITH PROPOFOL: SHX5780

## 2022-07-14 LAB — COMPREHENSIVE METABOLIC PANEL
ALT: 17 U/L (ref 0–44)
AST: 17 U/L (ref 15–41)
Albumin: 2.5 g/dL — ABNORMAL LOW (ref 3.5–5.0)
Alkaline Phosphatase: 39 U/L (ref 38–126)
Anion gap: 6 (ref 5–15)
BUN: 9 mg/dL (ref 6–20)
CO2: 21 mmol/L — ABNORMAL LOW (ref 22–32)
Calcium: 7.9 mg/dL — ABNORMAL LOW (ref 8.9–10.3)
Chloride: 110 mmol/L (ref 98–111)
Creatinine, Ser: 0.86 mg/dL (ref 0.61–1.24)
GFR, Estimated: >60 mL/min (ref >60)
Glucose, Bld: 111 mg/dL — ABNORMAL HIGH (ref 70–99)
Potassium: 3.2 mmol/L — ABNORMAL LOW (ref 3.5–5.1)
Sodium: 137 mmol/L (ref 135–145)
Total Bilirubin: 0.6 mg/dL (ref 0.3–1.2)
Total Protein: 4.9 g/dL — ABNORMAL LOW (ref 6.5–8.1)

## 2022-07-14 LAB — CBC
HCT: 22.9 % — ABNORMAL LOW (ref 39.0–52.0)
HCT: 23.7 % — ABNORMAL LOW (ref 39.0–52.0)
Hemoglobin: 7.4 g/dL — ABNORMAL LOW (ref 13.0–17.0)
Hemoglobin: 7.6 g/dL — ABNORMAL LOW (ref 13.0–17.0)
MCH: 28.5 pg (ref 26.0–34.0)
MCH: 28 pg (ref 26.0–34.0)
MCHC: 32.3 g/dL (ref 30.0–36.0)
MCHC: 32.1 g/dL (ref 30.0–36.0)
MCV: 88.1 fL (ref 80.0–100.0)
MCV: 87.5 fL (ref 80.0–100.0)
Platelets: 247 10*3/uL (ref 150–400)
Platelets: 242 10*3/uL (ref 150–400)
RBC: 2.6 MIL/uL — ABNORMAL LOW (ref 4.22–5.81)
RBC: 2.71 MIL/uL — ABNORMAL LOW (ref 4.22–5.81)
RDW: 15.5 % (ref 11.5–15.5)
RDW: 15.1 % (ref 11.5–15.5)
WBC: 10.7 10*3/uL — ABNORMAL HIGH (ref 4.0–10.5)
WBC: 8.3 10*3/uL (ref 4.0–10.5)
nRBC: 0.2 % (ref 0.0–0.2)
nRBC: 0.2 % (ref 0.0–0.2)

## 2022-07-14 LAB — CULTURE, BLOOD (ROUTINE X 2): Culture: NO GROWTH

## 2022-07-14 SURGERY — COLONOSCOPY WITH PROPOFOL
Anesthesia: Monitor Anesthesia Care

## 2022-07-14 MED ORDER — LACTATED RINGERS IV SOLN: INTRAVENOUS | Status: DC

## 2022-07-14 MED ORDER — LIDOCAINE 2% (20 MG/ML) 5 ML SYRINGE
INTRAMUSCULAR | Status: DC | PRN
  Administered 2022-07-14: 80 mg via INTRAVENOUS

## 2022-07-14 MED ORDER — SUCCINYLCHOLINE CHLORIDE 200 MG/10ML IV SOSY
PREFILLED_SYRINGE | INTRAVENOUS | Status: DC | PRN
  Administered 2022-07-14: 120 mg via INTRAVENOUS

## 2022-07-14 MED ORDER — PROPOFOL 10 MG/ML IV BOLUS
INTRAVENOUS | Status: DC | PRN
  Administered 2022-07-14: 150 mg via INTRAVENOUS
  Administered 2022-07-14: 30 mg via INTRAVENOUS
  Administered 2022-07-14: 20 mg via INTRAVENOUS

## 2022-07-14 MED ORDER — ONDANSETRON HCL 4 MG/2ML IJ SOLN
INTRAMUSCULAR | Status: DC | PRN
  Administered 2022-07-14: 4 mg via INTRAVENOUS

## 2022-07-14 MED ORDER — MIDAZOLAM HCL 2 MG/2ML IJ SOLN
INTRAMUSCULAR | Status: DC | PRN
  Administered 2022-07-14: 2 mg via INTRAVENOUS

## 2022-07-14 MED ORDER — DEXAMETHASONE SODIUM PHOSPHATE 10 MG/ML IJ SOLN
INTRAMUSCULAR | Status: DC | PRN
  Administered 2022-07-14: 10 mg via INTRAVENOUS

## 2022-07-14 MED ORDER — MIDAZOLAM HCL 2 MG/2ML IJ SOLN
INTRAMUSCULAR | Status: AC
  Filled 2022-07-14: qty 2

## 2022-07-14 MED ORDER — FENTANYL CITRATE (PF) 100 MCG/2ML IJ SOLN
INTRAMUSCULAR | Status: AC
  Filled 2022-07-14: qty 2

## 2022-07-14 MED ORDER — FENTANYL CITRATE (PF) 100 MCG/2ML IJ SOLN
INTRAMUSCULAR | Status: DC | PRN
  Administered 2022-07-14 (×2): 50 ug via INTRAVENOUS

## 2022-07-14 SURGICAL SUPPLY — 22 items

## 2022-07-14 NOTE — Plan of Care (Signed)

## 2022-07-14 NOTE — Anesthesia Procedure Notes (Signed)
Procedure Name: Intubation Date/Time: 07/14/2022 8:32 AM  Performed by: Nils Pyle, CRNAPre-anesthesia Checklist: Patient identified, Emergency Drugs available, Suction available and Patient being monitored Patient Re-evaluated:Patient Re-evaluated prior to induction Oxygen Delivery Method: Circle System Utilized Preoxygenation: Pre-oxygenation with 100% oxygen Induction Type: IV induction and Rapid sequence Ventilation: Mask ventilation without difficulty Laryngoscope Size: Miller and 2 Grade View: Grade I Tube type: Oral Tube size: 7.5 mm Number of attempts: 1 Airway Equipment and Method: Stylet and Oral airway Placement Confirmation: ETT inserted through vocal cords under direct vision, positive ETCO2 and breath sounds checked- equal and bilateral Secured at: 23 cm Tube secured with: Tape Dental Injury: Teeth and Oropharynx as per pre-operative assessment

## 2022-07-14 NOTE — Progress Notes (Addendum)
Endo RN called to get report on pt for scheduled colonoscopy this AM - was informed pt drank approximately 75% of prep and pt told night shift RN his stool was clear for procedure "with some chunks". Endo RN went upstairs to transport pt for procedure and pt was concerned with his last BM, stating it wasn't clear now and wanted to show RN. Stool in toilet was thick brown liquid and not clear. Dr. Myrtie Neither notified. Per MD, ok to proceed with scheduled colonoscopy.

## 2022-07-14 NOTE — Anesthesia Postprocedure Evaluation (Signed)
Anesthesia Post Note  Patient: Nathan Rowland  Procedure(s) Performed: COLONOSCOPY WITH PROPOFOL     Patient location during evaluation: PACU Anesthesia Type: MAC Level of consciousness: awake and alert Pain management: pain level controlled Vital Signs Assessment: post-procedure vital signs reviewed and stable Respiratory status: spontaneous breathing, nonlabored ventilation, respiratory function stable and patient connected to nasal cannula oxygen Cardiovascular status: stable and blood pressure returned to baseline Postop Assessment: no apparent nausea or vomiting Anesthetic complications: no   No notable events documented.  Last Vitals:  Vitals:   07/14/22 1210 07/14/22 1211  BP:    Pulse: (!) 104 (!) 105  Resp: 19 20  Temp:    SpO2: 93% 92%    Last Pain:  Vitals:   07/14/22 1200  TempSrc: Oral  PainSc:                  [name]

## 2022-07-14 NOTE — Progress Notes (Signed)
PROGRESS NOTE        PATIENT DETAILS Name: Nathan Rowland Age: 42 y.o. Sex: male Date of Birth: 14-Jul-1980 Admit Date: 07/11/2022 Admitting Physician John Giovanni, MD BJY:NWGNFA, Dwana Curd, MD  Brief Summary: Patient is a 42 y.o.  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  Significant events: 5/12>> admit to [facility] 5/13>> EGD/enteroscopy-no obvious bleeding.  Developed chills/hypoxia post EGD-PCCM consulted-found to have left lower lung infiltrate on CT.  Significant studies: 5/13>> CT angio GI bleed: No of contrast extravasation to suggest active GI bleed. 5/13>> CT angio chest: Left lower lobe consolidation-several focal hypodense areas within the segmental/subsegmental right upper lobe/right lower lobe-indeterminate for small PE.  Significant microbiology data: 5/13>> blood cultures: No cultures.  Procedures: 5/13>> small bowel enteroscopy 5/14>> video capsule endoscopy: No bleeding seen. 5/15>> colonoscopy: Blood in the terminal ileum/entire colon.  Consults: GI PCCM  Subjective: No hematochezia/melena.  Objective: Vitals: Blood pressure 123/70, pulse (!) 107, temperature 98.4 F (36.9 C), resp. rate 20, height 6' (1.829 m), weight 108.9 kg, SpO2 91 %.   Exam: Gen Exam:Alert awake-not in any distress HEENT:atraumatic, normocephalic Chest: B/L clear to auscultation anteriorly CVS:S1S2 regular Abdomen:soft non tender, non distended Extremities:no edema Neurology: Non focal Skin: no rash  Pertinent Labs/Radiology:    Latest Ref Rng & Units 07/14/2022    7:35 AM 07/13/2022    6:18 AM 07/12/2022   10:17 PM  CBC  WBC 4.0 - 10.5 K/uL 10.7  12.6    Hemoglobin 13.0 - 17.0 g/dL 7.4  7.5  8.1   Hematocrit 39.0 - 52.0 % 22.9  23.1    Platelets 150 - 400 K/uL 247   229      Lab Results  Component Value Date   NA 137 07/14/2022   K 3.2 (L) 07/14/2022   CL 110 07/14/2022   CO2 21 (L) 07/14/2022     Assessment/Plan: Upper GI bleeding with acute blood loss anemia Source of bleeding not evident-in spite of extensive evaluation Workup as above-colonoscopy with blood in terminal ileum/colon Repeat CBC later today GI consulting IR for trans splenic percutaneous approach to portal venous abnormality/stenting.  Acute hypoxic respiratory failure Aspiration pneumonia Hypoxia on 5/13-felt to be aspiration pneumonia-unlikely to be TRALI Rapidly improved and now on room air Rocephin/Flagyl x 5 days total.    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  Obesity: Estimated body mass index is 32.55 kg/m as calculated from the following:   Height as of this encounter: 6' (1.829 m).   Weight as of this encounter: 108.9 kg.   Code status:   Code Status: Full Code   DVT Prophylaxis: SCDs Start: 07/12/22 0216   Family Communication: None at bedside   Disposition Plan: Status is: Inpatient Remains inpatient appropriate because: Severity of illness   Planned Discharge Destination:Home   Diet: Diet Order             Diet regular Room service appropriate? Yes; Fluid consistency: Thin  Diet effective now  Antimicrobial agents: Anti-infectives (From admission, onward)    Start     Dose/Rate Route Frequency Ordered Stop   07/12/22 1900  metroNIDAZOLE (FLAGYL) IVPB 500 mg        500 mg 100 mL/hr over 60 Minutes Intravenous Every 12 hours 07/12/22 1812     07/12/22 0200  cefTRIAXone (ROCEPHIN) 1 g in sodium chloride 0.9 % 100 mL IVPB        1 g 200 mL/hr over 30 Minutes Intravenous Daily at 10 pm 07/12/22 0158           MEDICATIONS: Scheduled Meds:  sodium chloride   Intravenous Once   Continuous Infusions:  cefTRIAXone (ROCEPHIN)  IV Stopped (07/13/22 2215)   metronidazole 500 mg (07/14/22 0649)   PRN Meds:.acetaminophen, ondansetron (ZOFRAN) IV   I have personally reviewed following labs and imaging studies  LABORATORY DATA: CBC: Recent Labs  Lab 07/12/22 0247 07/12/22 0636 07/12/22 0835 07/12/22 1513 07/12/22 1703 07/12/22 2217 07/13/22 0618 07/14/22 0735  WBC 13.5*  --  11.4*  --  10.9*  --  12.6* 10.7*  HGB 3.8* 6.6* 7.1* 8.9* 9.5* 8.1* 7.5* 7.4*  HCT 12.6* 20.6* 22.2*  --  30.3*  --  23.1* 22.9*  MCV 95.5  --  89.5  --  90.4  --  87.8 88.1  PLT 151  --  258  --  363  --  229 247     Basic Metabolic Panel: Recent Labs  Lab 07/11/22 1646 07/12/22 1703 07/13/22 0618 07/14/22 0735  NA 138 140 137 137  K 4.0 3.6 3.4* 3.2*  CL 109 111 107 110  CO2 21* 18* 23 21*  GLUCOSE 132* 94 114* 111*  BUN 22* 15 15 9   CREATININE 0.98 0.96 1.04 0.86  CALCIUM 8.3* 7.7* 7.4* 7.9*     GFR: Estimated Creatinine Clearance: 147 mL/min (by C-G formula based on SCr of 0.86 mg/dL).  Liver Function Tests: Recent Labs  Lab 07/11/22 1646 07/12/22 1703 07/13/22 0618 07/14/22 0735  AST 19 20 17 17   ALT 20 17 17 17   ALKPHOS 46 40 36* 39  BILITOT 0.4 1.0 0.7 0.6  PROT 5.3* 4.9* 4.3* 4.9*  ALBUMIN 2.9* 2.8* 2.3* 2.5*    No results for input(s): "LIPASE", "AMYLASE" in the last 168 hours. No results for input(s): "AMMONIA" in the last 168 hours.  Coagulation Profile: Recent Labs  Lab 07/12/22 0835  INR 1.2     Cardiac Enzymes: No results for input(s): "CKTOTAL", "CKMB", "CKMBINDEX", "TROPONINI" in the last 168 hours.  BNP (last 3 results) No results for input(s): "PROBNP" in the last 8760 hours.  Lipid Profile: No results for input(s): "CHOL", "HDL", "LDLCALC", "TRIG", "CHOLHDL", "LDLDIRECT" in the last 72 hours.  Thyroid Function Tests: No results for input(s):  "TSH", "T4TOTAL", "FREET4", "T3FREE", "THYROIDAB" in the last 72 hours.  Anemia Panel: No results for input(s): "VITAMINB12", "FOLATE", "FERRITIN", "TIBC", "IRON", "RETICCTPCT" in the last 72 hours.  Urine analysis:    Component Value Date/Time   COLORURINE STRAW (A) 07/12/2022 1631   APPEARANCEUR CLEAR 07/12/2022 1631   LABSPEC 1.018 07/12/2022 1631   PHURINE 5.0 07/12/2022 1631   GLUCOSEU NEGATIVE 07/12/2022 1631   HGBUR NEGATIVE 07/12/2022 1631   BILIRUBINUR NEGATIVE 07/12/2022 1631   BILIRUBINUR Neg 05/30/2012 1445   KETONESUR NEGATIVE 07/12/2022 1631   PROTEINUR NEGATIVE 07/12/2022 1631   UROBILINOGEN negative 05/30/2012 1445   NITRITE NEGATIVE 07/12/2022 1631   LEUKOCYTESUR NEGATIVE 07/12/2022 1631    Sepsis Labs: Lactic Acid,  Venous No results found for: "LATICACIDVEN"  MICROBIOLOGY: Recent Results (from the past 240 hour(s))  Culture, blood (Routine X 2) w Reflex to ID Panel     Status: None (Preliminary result)   Collection Time: 07/12/22  5:03 PM   Specimen: BLOOD RIGHT HAND  Result Value Ref Range Status   Specimen Description BLOOD RIGHT HAND  Final   Special Requests   Final    BOTTLES DRAWN AEROBIC AND ANAEROBIC Blood Culture results may not be optimal due to an inadequate volume of blood received in culture bottles   Culture   Final    NO GROWTH 2 DAYS Performed at [facility], [address], [place], [place] [address]    Report Status PENDING  Incomplete  Culture, blood (Routine X 2) w Reflex to ID Panel     Status: None (Preliminary result)   Collection Time: 07/12/22  5:04 PM   Specimen: BLOOD  Result Value Ref Range Status   Specimen Description BLOOD RIGHT ANTECUBITAL  Final   Special Requests   Final    BOTTLES DRAWN AEROBIC AND ANAEROBIC Blood Culture adequate volume   Culture   Final    NO GROWTH 2 DAYS Performed at [facility], [address] [place], [place] [address]    Report Status PENDING  Incomplete    RADIOLOGY  STUDIES/RESULTS: CT Angio Chest Pulmonary Embolism (PE) W or WO Contrast  Result Date: 07/12/2022 CLINICAL DATA:  Dyspnea, hypoxia. EXAM: CT ANGIOGRAPHY CHEST WITH CONTRAST TECHNIQUE: Multidetector CT imaging of the chest was performed using the standard protocol during bolus administration of intravenous contrast. Multiplanar CT image reconstructions and MIPs were obtained to evaluate the vascular anatomy. RADIATION DOSE REDUCTION: This exam was performed according to the departmental dose-optimization program which includes automated exposure control, adjustment of the mA and/or kV according to patient size and/or use of iterative reconstruction technique. CONTRAST:  75mL OMNIPAQUE IOHEXOL 350 MG/ML SOLN COMPARISON:  CTA GI bleed 07/12/2022.  Chest CT 02/20/2021. FINDINGS: Cardiovascular: Examination is limited secondary to timing of the contrast bolus and respiratory motion artifact. There several focal hypodense areas seen within segmental and subsegmental levels of the right upper lobe and right lower lobe which are indeterminate for small pulmonary emboli for example axial image 5/66. The main pulmonary artery is normal in size. Heart and aorta are normal in size. There is no pericardial effusion. Mediastinum/Nodes: There are prominent, but nonenlarged left hilar lymph nodes. Visualized esophagus and thyroid gland are within normal limits. Lungs/Pleura: There is new dense airspace consolidation throughout the entire left lower lobe and minimally in the inferior left upper lobe. There are additional patchy ground-glass opacities throughout the left upper lobe. There is a opacification of left lower lobe bronchi. The right lung is clear. There is no pleural effusion or pneumothorax. Upper Abdomen: Small amount of ascites in the upper abdomen appears unchanged. Musculoskeletal: No chest wall abnormality. No acute or significant osseous findings. Review of the MIP images confirms the above findings. IMPRESSION:  1. Examination is limited secondary to timing of the contrast bolus and respiratory motion artifact. There are several focal hypodense areas seen within segmental and subsegmental levels of the right upper lobe and right lower lobe which are indeterminate for small pulmonary emboli. No evidence for right heart strain. 2. New dense airspace consolidation throughout the entire left lower lobe and minimally in the inferior left upper lobe. There is opacification of left lower lobe bronchi. Findings are concerning for pneumonia. Follow-up  imaging recommended to confirm resolution. 3. Stable small amount of ascites in the upper abdomen. Electronically Signed   By: Darliss Cheney M.D.   On: 07/12/2022 18:02   DG CHEST PORT 1 VIEW  Result Date: 07/12/2022 CLINICAL DATA:  Dyspnea. EXAM: PORTABLE CHEST 1 VIEW COMPARISON:  AP chest 02/20/2021 and CT chest 02/20/2021 FINDINGS: Cardiac silhouette and mediastinal contours within normal limits. There is a new heterogeneous airspace opacification of the left mid and lower lung. The right lung appears clear. No definite pleural effusion. No pneumothorax. No acute skeletal abnormality. IMPRESSION: New heterogeneous airspace opacification of the left mid and lower lung, concerning for pneumonia. Note is made that on the CT abdomen and pelvis and frontal scout view of the CT approximately 7 hours earlier today at the left lower lung appears more clear. Question pneumonia versus recent aspiration. Electronically Signed   By: Neita Garnet M.D.   On: 07/12/2022 17:01     LOS: 2 days   Jeoffrey Massed, MD  [facility]    To contact the attending provider between 7A-7P or the covering provider during after hours 7P-7A, please log into the web site [email or web address] and access using universal [place] password for that web site. If you do not have the password, please call the hospital operator.  07/14/2022, 11:48 AM

## 2022-07-14 NOTE — Transfer of Care (Signed)
Immediate Anesthesia Transfer of Care Note  Patient: Nathan Rowland  Procedure(s) Performed: COLONOSCOPY WITH PROPOFOL  Patient Location: Endoscopy Unit  Anesthesia Type:General  Level of Consciousness: awake, alert , and oriented  Airway & Oxygen Therapy: Patient Spontanous Breathing  Post-op Assessment: Report given to RN, Post -op Vital signs reviewed and stable, and Patient moving all extremities X 4  Post vital signs: Reviewed and stable  Last Vitals:  Vitals Value Taken Time  BP 130/74   Temp    Pulse 111 07/14/22 0909  Resp 18 07/14/22 0909  SpO2 90 % 07/14/22 0909  Vitals shown include unvalidated device data.  Last Pain:  Vitals:   07/14/22 0804  TempSrc: Temporal  PainSc: 1          Complications: No notable events documented.

## 2022-07-14 NOTE — Interval H&P Note (Signed)
History and Physical Interval Note:  07/14/2022 8:19 AM  Nathan Rowland  has presented today for surgery, with the diagnosis of gastrointestinal bleeding.  The various methods of treatment have been discussed with the patient and family. After consideration of risks, benefits and other options for treatment, the patient has consented to  Procedure(s): COLONOSCOPY WITH PROPOFOL (N/A) as a surgical intervention.  The patient's history has been reviewed, patient examined, no change in status, stable for surgery.  I have reviewed the patient's chart and labs.  Questions were answered to the patient's satisfaction.     Charlie Pitter III

## 2022-07-14 NOTE — Op Note (Signed)
[facility] Patient Name: Nathan Rowland Procedure Date : 07/14/2022 MRN: [id number] Attending MD: Starr Lake. Myrtie Neither , MD, [id number] Date of Birth: 1980-03-20 CSN: [id number] Age: 42 Admit Type: Inpatient Procedure:                Colonoscopy Indications:              Melena, Acute post hemorrhagic anemia                           See inpatient consult and follow-up notes as well                            as inpatient small bowel enteroscopy report for                            clinical details. Providers:                Starr Lake. Myrtie Neither, MD, Lorenza Evangelist, RN, Beryle Beams, Technician Referring MD:             [facility] Medicines:                Monitored Anesthesia Care Complications:            No immediate complications. Estimated Blood Loss:     Estimated blood loss: none. Procedure:                Pre-Anesthesia Assessment:                           - Prior to the procedure, a History and Physical                            was performed, and patient medications and                            allergies were reviewed. The patient's tolerance of                            previous anesthesia was also reviewed. The risks                            and benefits of the procedure and the sedation                            options and risks were discussed with the patient.                            All questions were answered, and informed consent                            was obtained. Prior Anticoagulants: The patient has                            taken  Eliquis (apixaban), last dose was 3 days                            prior to procedure. ASA Grade Assessment: III - A                            patient with severe systemic disease. After                            reviewing the risks and benefits, the patient was                            deemed in satisfactory condition to undergo the                            procedure.                            After obtaining informed consent, the colonoscope                            was passed under direct vision. Throughout the                            procedure, the patient's blood pressure, pulse, and                            oxygen saturations were monitored continuously. The                            CF-HQ190L ([id number]) Olympus coloscope was                            introduced through the anus and advanced to the the                            terminal ileum, with identification of the                            appendiceal orifice and IC valve. The colonoscopy                            was performed without difficulty. The patient                            tolerated the procedure well. The quality of the                            bowel preparation was fair (see below, residual                            effects of GI bleeding, no solid material retained  in the colon). The terminal ileum, ileocecal valve,                            appendiceal orifice, and rectum were photographed.                            The bowel preparation used was MoviPrep via split                            dose instruction. Scope In: 8:37:30 AM Scope Out: 9:00:08 AM Scope Withdrawal Time: 0 hours 19 minutes 41 seconds  Total Procedure Duration: 0 hours 22 minutes 38 seconds  Findings:      The perianal and digital rectal examinations were normal.      The terminal ileum contained hematin (altered blood/coffee-ground-like       material). Black and dark bilious fluid with scattered flecks of black       debris. Mucosa otherwise normal. TI intubated for about 20 cm.      Hematin (altered blood/coffee-ground-like material) was found in the       entire colon. Also black and dark bilious fluid with flecks of black       debris. There was also a small fresh blood clot in the cecum suctioned       and dragged out of the way for better mucosal  visualization. Extensive       lavage performed. No mucosal bleeding sources were found throughout the       colon.      The exam was otherwise without abnormality on direct and retroflexion       views. Impression:               - Preparation of the colon was fair.                           - Blood in the terminal ileum.                           - Blood in the entire examined colon.                           - The examination was otherwise normal on direct                            and retroflexion views.                           - No specimens collected.                           No visualized colonic source of bleeding was seen.                            As previously noted, this patient had a normal                            colonoscopy to the terminal ileum with excellent  bowel preparation in the outpatient setting 4                            months ago.                           The source of current bleeding is unclear at                            present. There has been no hematemesis, and there                            was no clear stigmata of bleeding from the upper GI                            varices on inpatient enteroscopy, and no traces of                            fresh or old blood in the upper GI tract on that                            scope. No active bleeding, fresh or old blood seen                            throughout the small bowel on a video capsule study                            the same day. Given the inherent limitations of                            video capsule study, it is still felt possible that                            obscure small bowel source may be intermittently                            bleeding. Given the worsening portal hypertension                            evidenced on imaging over the last 6 to 9 months, a                            small bowel varix or even area of edematous friable                             mucosa could be a bleeding source on the patient on                            oral anticoagulation. Recommendation:           - Return patient to hospital ward for ongoing care.                           -  Resume regular diet.                           - Twice daily Hgb/Hct                           - Given this patient's noncirrhotic portal                            hypertension and concerns that this is related to                            the currently obscure bleeding source, octreotide                            will be resumed.                           Interventional radiology will consult on this                            patient while admitted and revisit the                            recently-discussed possibility of a trans-splenic                            percutaneous approach to this portal venous                            abnormality. Procedure Code(s):        --- Professional ---                           [phone], Colonoscopy, flexible; diagnostic, including                            collection of specimen(s) by brushing or washing,                            when performed (separate procedure) Diagnosis Code(s):        --- Professional ---                           K92.2, Gastrointestinal hemorrhage, unspecified                           K92.1, Melena (includes Hematochezia)                           D62, Acute posthemorrhagic anemia CPT copyright 2022 American [employer]. All rights reserved. The codes documented in this report are preliminary and upon coder review may  be revised to meet current compliance requirements. Jaeshawn Silvio L. Myrtie Neither, MD 07/14/2022 9:18:50 AM This report has been signed electronically. Number of Addenda: 0

## 2022-07-15 ENCOUNTER — Encounter (HOSPITAL_COMMUNITY): Payer: Self-pay | Admitting: Gastroenterology

## 2022-07-15 DIAGNOSIS — K766 Portal hypertension: Secondary | ICD-10-CM | POA: Diagnosis not present

## 2022-07-15 DIAGNOSIS — K921 Melena: Secondary | ICD-10-CM | POA: Diagnosis not present

## 2022-07-15 DIAGNOSIS — D62 Acute posthemorrhagic anemia: Secondary | ICD-10-CM | POA: Diagnosis not present

## 2022-07-15 DIAGNOSIS — K922 Gastrointestinal hemorrhage, unspecified: Secondary | ICD-10-CM | POA: Diagnosis not present

## 2022-07-15 DIAGNOSIS — Z7901 Long term (current) use of anticoagulants: Secondary | ICD-10-CM | POA: Diagnosis not present

## 2022-07-15 DIAGNOSIS — I81 Portal vein thrombosis: Secondary | ICD-10-CM

## 2022-07-15 LAB — BPAM RBC
ISSUE DATE / TIME: 202405160920
Unit Type and Rh: 6200

## 2022-07-15 LAB — CBC
HCT: 20.9 % — ABNORMAL LOW (ref 39.0–52.0)
Hemoglobin: 6.7 g/dL — CL (ref 13.0–17.0)
MCH: 28.2 pg (ref 26.0–34.0)
MCHC: 32.1 g/dL (ref 30.0–36.0)
MCV: 87.8 fL (ref 80.0–100.0)
Platelets: 243 10*3/uL (ref 150–400)
RBC: 2.38 MIL/uL — ABNORMAL LOW (ref 4.22–5.81)
RDW: 15.1 % (ref 11.5–15.5)
WBC: 9.8 10*3/uL (ref 4.0–10.5)
nRBC: 0.3 % — ABNORMAL HIGH (ref 0.0–0.2)

## 2022-07-15 LAB — HEMOGLOBIN AND HEMATOCRIT, BLOOD
HCT: 25.1 % — ABNORMAL LOW (ref 39.0–52.0)
Hemoglobin: 8.2 g/dL — ABNORMAL LOW (ref 13.0–17.0)

## 2022-07-15 LAB — CULTURE, BLOOD (ROUTINE X 2)
Culture: NO GROWTH
Special Requests: ADEQUATE

## 2022-07-15 LAB — BASIC METABOLIC PANEL
Anion gap: 7 (ref 5–15)
BUN: 10 mg/dL (ref 6–20)
CO2: 22 mmol/L (ref 22–32)
Calcium: 8.1 mg/dL — ABNORMAL LOW (ref 8.9–10.3)
Chloride: 108 mmol/L (ref 98–111)
Creatinine, Ser: 0.84 mg/dL (ref 0.61–1.24)
GFR, Estimated: >60 mL/min (ref >60)
Glucose, Bld: 132 mg/dL — ABNORMAL HIGH (ref 70–99)
Potassium: 3.8 mmol/L (ref 3.5–5.1)
Sodium: 137 mmol/L (ref 135–145)

## 2022-07-15 LAB — TYPE AND SCREEN
Antibody Screen: NEGATIVE
Unit division: 0

## 2022-07-15 LAB — PREPARE RBC (CROSSMATCH)

## 2022-07-15 LAB — GLUCOSE, CAPILLARY: Glucose-Capillary: 131 mg/dL — ABNORMAL HIGH (ref 70–99)

## 2022-07-15 MED ORDER — SODIUM CHLORIDE 0.9% IV SOLUTION: Freq: Once | INTRAVENOUS | Status: AC

## 2022-07-15 MED ORDER — FUROSEMIDE 10 MG/ML IJ SOLN
20.0000 mg | Freq: Once | INTRAMUSCULAR | Status: AC
  Administered 2022-07-15: 20 mg via INTRAVENOUS
  Filled 2022-07-15: qty 2

## 2022-07-15 MED ORDER — LOPERAMIDE HCL 2 MG PO CAPS
2.0000 mg | ORAL_CAPSULE | ORAL | Status: DC | PRN
  Administered 2022-07-15 (×2): 2 mg via ORAL
  Filled 2022-07-15 (×2): qty 1

## 2022-07-15 MED ORDER — DIPHENHYDRAMINE HCL 25 MG PO CAPS
25.0000 mg | ORAL_CAPSULE | Freq: Once | ORAL | Status: AC
  Administered 2022-07-15: 25 mg via ORAL
  Filled 2022-07-15: qty 1

## 2022-07-15 NOTE — Progress Notes (Signed)
[facility] night cross cover note:   I was notified by RN that this morning's labs reflect hemoglobin of 6.7 compared to 7.6 on the afternoon of 07/14/2022.  Most recent blood pressure 133/85, and heart rates continue to be in the low 100s, consistent with heart rates throughout dayshift.  No report of any new associated symptoms.  It is noted the patient has previously required PBC transfusion earlier in this hospitalization, with low hemoglobin over the course noted to be 3.8 on 07/12/2022.  I subsequently ordered transfusion of 1 unit PRBC over 2 hours, and also placed order for H&H to be checked following completion of transfusion at this 1 unit PRBC.    Newton Pigg, DO Hospitalist

## 2022-07-15 NOTE — Consult Note (Signed)
Chief Complaint: Patient was seen in consultation today for portal HTN/GI bleeding  Referring Physician(s): Amada Jupiter, MD  Supervising Physician: Marliss Coots  Patient Status: [facility] - In-pt  History of Present Illness: Nathan Rowland is a 42 y.o. 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 Elby Showers in [facility] Radiology Portal HTN, chronic anemia who presented to [facility] ED 07/12/22 with complaints of dyspnea, lightheadedness, near syncope, weakness and dark stools. On evaluation in the ED he was noted to be tachycardic, normotensive, WBC 15.1, hgb 4.8, plt 406, (+) FOBT. He was admitted and given 2 units pRBCs with post transfusion hgb 5.7. He developed a transfusion reaction which was treated. He underwent small bowl enteroscopy that same day with GI which showed gastroesophageal varices with no active bleeding seen. He unfortunately had an aspiration event following the procedure and developed aspiration pneumonia which has been treated with antibiotics. CTA that same day did not show active GI bleeding. He underwent video capsule endoscopy on 5/14 which did not find any bleeding. He continued to have post transfusion improvements in hgb with subsequent worsening within 24 hours of transfusion. He underwent colonoscopy on 5/15 which showed blood in the terminal ileum and entire colon but did not find a source of active bleeding. IR has been consulted to discuss potential intervention during this admission.  Patient seen in outpatient consultation for portal HTN with portal vein thrombosis by Dr. Elby Showers 06/02/22 -- please see this note for full details. Briefly, TIPS creation to decrease venous congestion was discussed and patient deemed to be a potential candidate however he wanted to discuss this further this his family before deciding.  Patient seen at bedside  today with Dr. Elby Showers, his parents are also present for discussion. He denies complaints exception frustration that the bleeding cannot be found, wondering what next potential steps are. He has not seen blood in his stool for several days, currently having diarrhea due to antibiotics. He would like to go home but is anxious about ongoing GI bleeding due to continued drops in hgb, so would prefer to stay here until that's resolved. He also would like to not take a blood thinner anymore if possible. Discussed current situation in relation to previous outpatient consultation with Dr. Elby Showers, again offered TIPS with intention being to decrease venous congestion and therefore stopping subsequent occult GI bleeding. Risks, benefits and alternatives discussed at length today with patient and his family -- they would like to proceed with TIPS during this admission.  Past Medical History:  Diagnosis Date   Allergy 11/29/1996   Finger fracture    Kidney calculus    Left knee pain    dx'd with L retropatellar knee pain    Past Surgical History:  Procedure Laterality Date   ENTEROSCOPY N/A 07/12/2022   Procedure: ENTEROSCOPY;  Surgeon: Sherrilyn Rist, MD;  Location: [facility] ENDOSCOPY;  Service: Gastroenterology;  Laterality: N/A;   GIVENS CAPSULE STUDY N/A 07/12/2022   Procedure: GIVENS CAPSULE STUDY;  Surgeon: Sherrilyn Rist, MD;  Location: [facility] ENDOSCOPY;  Service: Gastroenterology;  Laterality: N/A;   IR RADIOLOGIST EVAL & MGMT  06/02/2022   SUBMUCOSAL TATTOO INJECTION  07/12/2022   Procedure: SUBMUCOSAL TATTOO INJECTION;  Surgeon: Sherrilyn Rist, MD;  Location: [facility] ENDOSCOPY;  Service: Gastroenterology;;    Allergies: Pollen extract  Medications: Prior to Admission medications   Medication Sig Start Date  End Date Taking? Authorizing Provider  ELIQUIS 5 MG TABS tablet TAKE 1 TABLET BY MOUTH TWICE A DAY 07/12/22   Joaquim Nam, MD  EPINEPHrine 0.3 mg/0.3 mL IJ SOAJ injection Inject 0.3 mg into  the muscle once as needed for anaphylaxis.    [provider]  Iron, Ferrous Sulfate, 325 (65 Fe) MG TABS Take 325 mg by mouth daily. Patient not taking: Reported on 07/11/2022 05/03/22   Joaquim Nam, MD     Family History  Problem Relation Age of Onset   Diabetes Other        3 great aunts, adult onset   Cancer Other        Uterine   Heart disease Maternal Grandmother        MI   Stroke Maternal Grandmother    Cancer Maternal Grandfather        Lung   Cancer Paternal Grandmother        Skin   Emphysema Paternal Grandfather    Hypertension Neg Hx    Depression Neg Hx    Alcohol abuse Neg Hx    Drug abuse Neg Hx    Prostate cancer Neg Hx    Colon cancer Neg Hx     Social History   Socioeconomic History   Marital status: Single    Spouse name: Not on file   Number of children: Not on file   Years of education: Not on file   Highest education level: 12th grade  Occupational History   Not on file  Tobacco Use   Smoking status: Never    Passive exposure: Past   Smokeless tobacco: Never  Substance and Sexual Activity   Alcohol use: Yes    Comment: occ   Drug use: No   Sexual activity: Not on file  Other Topics Concern   Not on file  Social History Narrative   Single, 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, sports   Social Determinants of Health   Financial Resource Strain: Patient Declined (05/25/2022)   Overall Financial Resource Strain (CARDIA)    Difficulty of Paying Living Expenses: Patient declined  Food Insecurity: No Food Insecurity (07/11/2022)   Hunger Vital Sign    Worried About Running Out of Food in the Last Year: Never true    Ran Out of Food in the Last Year: Never true  Transportation Needs: No Transportation Needs (07/11/2022)   PRAPARE - Administrator, Civil Service (Medical): No    Lack of Transportation (Non-Medical): No  Physical Activity: Unknown (05/25/2022)   Exercise Vital Sign    Days of Exercise per Week: Patient declined     Minutes of Exercise per Session: Not on file  Stress: Patient Declined (05/25/2022)   [employer] of Occupational Health - Occupational Stress Questionnaire    Feeling of Stress : Patient declined  Social Connections: Unknown (05/25/2022)   Social Connection and Isolation Panel [NHANES]    Frequency of Communication with Friends and Family: Patient declined    Frequency of Social Gatherings with Friends and Family: Patient declined    Attends Religious Services: Patient declined    Database administrator or Organizations: Patient declined    Attends Engineer, structural: Not on file    Marital Status: Never married     Review of Systems: A 12 point ROS discussed and pertinent positives are indicated in the HPI above.  All other systems are negative.  Review of Systems  Constitutional:  Negative for chills and fever.  Respiratory:  Negative for cough and shortness of breath.   Cardiovascular:  Negative for chest pain.  Gastrointestinal:  Positive for diarrhea. Negative for abdominal pain, blood in stool, nausea and vomiting.  Musculoskeletal:  Negative for back pain.  Neurological:  Negative for dizziness and headaches.    Vital Signs: BP 132/89 (BP Location: Right Arm)   Pulse (!) 105   Temp 98.3 F (36.8 C) (Axillary)   Resp 19   Ht 6' (1.829 m)   Wt 240 lb (108.9 kg)   SpO2 98%   BMI 32.55 kg/m   Physical Exam Vitals and nursing note reviewed.  Constitutional:      General: He is not in acute distress. HENT:     Head: Normocephalic.     Mouth/Throat:     Mouth: Mucous membranes are moist.     Pharynx: Oropharynx is clear. No oropharyngeal exudate or posterior oropharyngeal erythema.  Eyes:     General: No scleral icterus. Cardiovascular:     Rate and Rhythm: Normal rate.  Pulmonary:     Effort: Pulmonary effort is normal.  Abdominal:     General: There is no distension.     Palpations: Abdomen is soft.     Tenderness: There is no abdominal  tenderness.  Skin:    General: Skin is warm and dry.     Coloration: Skin is not jaundiced.  Neurological:     Mental Status: He is alert and oriented to person, place, and time.  Psychiatric:        Mood and Affect: Mood normal.        Behavior: Behavior normal.        Thought Content: Thought content normal.        Judgment: Judgment normal.      MD Evaluation Airway: WNL Heart: WNL Abdomen: WNL Chest/ Lungs: WNL ASA  Classification: 3 Mallampati/Airway Score: One   Advanced Care Plan: The advanced care plan/surrogate decision was discussed at the time of the visit and documented in the medical record. Patient is FULL CODE.  Imaging: CT Angio Chest Pulmonary Embolism (PE) W or WO Contrast  Result Date: 07/12/2022 CLINICAL DATA:  Dyspnea, hypoxia. EXAM: CT ANGIOGRAPHY CHEST WITH CONTRAST TECHNIQUE: Multidetector CT imaging of the chest was performed using the standard protocol during bolus administration of intravenous contrast. Multiplanar CT image reconstructions and MIPs were obtained to evaluate the vascular anatomy. RADIATION DOSE REDUCTION: This exam was performed according to the departmental dose-optimization program which includes automated exposure control, adjustment of the mA and/or kV according to patient size and/or use of iterative reconstruction technique. CONTRAST:  75mL OMNIPAQUE IOHEXOL 350 MG/ML SOLN COMPARISON:  CTA GI bleed 07/12/2022.  Chest CT 02/20/2021. FINDINGS: Cardiovascular: Examination is limited secondary to timing of the contrast bolus and respiratory motion artifact. There several focal hypodense areas seen within segmental and subsegmental levels of the right upper lobe and right lower lobe which are indeterminate for small pulmonary emboli for example axial image 5/66. The main pulmonary artery is normal in size. Heart and aorta are normal in size. There is no pericardial effusion. Mediastinum/Nodes: There are prominent, but nonenlarged left hilar  lymph nodes. Visualized esophagus and thyroid gland are within normal limits. Lungs/Pleura: There is new dense airspace consolidation throughout the entire left lower lobe and minimally in the inferior left upper lobe. There are additional patchy ground-glass opacities throughout the left upper lobe. There is a opacification of left lower lobe  bronchi. The right lung is clear. There is no pleural effusion or pneumothorax. Upper Abdomen: Small amount of ascites in the upper abdomen appears unchanged. Musculoskeletal: No chest wall abnormality. No acute or significant osseous findings. Review of the MIP images confirms the above findings. IMPRESSION: 1. Examination is limited secondary to timing of the contrast bolus and respiratory motion artifact. There are several focal hypodense areas seen within segmental and subsegmental levels of the right upper lobe and right lower lobe which are indeterminate for small pulmonary emboli. No evidence for right heart strain. 2. New dense airspace consolidation throughout the entire left lower lobe and minimally in the inferior left upper lobe. There is opacification of left lower lobe bronchi. Findings are concerning for pneumonia. Follow-up imaging recommended to confirm resolution. 3. Stable small amount of ascites in the upper abdomen. Electronically Signed   By: Darliss Cheney M.D.   On: 07/12/2022 18:02   DG CHEST PORT 1 VIEW  Result Date: 07/12/2022 CLINICAL DATA:  Dyspnea. EXAM: PORTABLE CHEST 1 VIEW COMPARISON:  AP chest 02/20/2021 and CT chest 02/20/2021 FINDINGS: Cardiac silhouette and mediastinal contours within normal limits. There is a new heterogeneous airspace opacification of the left mid and lower lung. The right lung appears clear. No definite pleural effusion. No pneumothorax. No acute skeletal abnormality. IMPRESSION: New heterogeneous airspace opacification of the left mid and lower lung, concerning for pneumonia. Note is made that on the CT abdomen and  pelvis and frontal scout view of the CT approximately 7 hours earlier today at the left lower lung appears more clear. Question pneumonia versus recent aspiration. Electronically Signed   By: Neita Garnet M.D.   On: 07/12/2022 17:01   CT ANGIO GI BLEED  Result Date: 07/12/2022 CLINICAL DATA:  Evaluate for GI bleed.  Anemia. EXAM: CTA ABDOMEN AND PELVIS WITHOUT AND WITH CONTRAST TECHNIQUE: Multidetector CT imaging of the abdomen and pelvis was performed using the standard protocol during bolus administration of intravenous contrast. Multiplanar reconstructed images and MIPs were obtained and reviewed to evaluate the vascular anatomy. RADIATION DOSE REDUCTION: This exam was performed according to the departmental dose-optimization program which includes automated exposure control, adjustment of the mA and/or kV according to patient size and/or use of iterative reconstruction technique. CONTRAST:  OMNIPAQUE IOHEXOL 350 MG/ML SOLN COMPARISON:  05/07/2018 for FINDINGS: VASCULAR Aorta: Normal caliber aorta without aneurysm, dissection, vasculitis or significant stenosis. Celiac: Patent without evidence of aneurysm, dissection, vasculitis or significant stenosis. SMA: Patent without evidence of aneurysm, dissection, vasculitis or significant stenosis. Renals: Both renal arteries are patent without evidence of aneurysm, dissection, vasculitis, fibromuscular dysplasia or significant stenosis. IMA: Patent without evidence of aneurysm, dissection, vasculitis or significant stenosis. Inflow: Patent without evidence of aneurysm, dissection, vasculitis or significant stenosis. Proximal Outflow: Bilateral common femoral and visualized portions of the superficial and profunda femoral arteries are patent without evidence of aneurysm, dissection, vasculitis or significant stenosis. 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  Review of the MIP images confirms the above findings. NON-VASCULAR Lower chest: No pleural effusion or airspace consolidation. Platelike atelectasis identified within the right middle lobe, lingula and both lower lobes Hepatobiliary: Multiple too small to characterize low-density liver lesions are again seen and appears similar to previous exam. Gallbladder appears normal. No wall thickening, inflammation or biliary dilatation. Pancreas: Unremarkable. No pancreatic ductal dilatation or surrounding inflammatory changes. Spleen: Mild splenomegaly measuring 13.6 cm. Stable from previous exam. Adrenals/Urinary Tract: Normal  appearance of the kidneys. No hydronephrosis or mass. Punctate stone within interpolar collecting system of the left kidney noted, image 110/11. Stomach/Bowel: No signs of intraluminal contrast extravasation within the small or large bowel loops to suggest active GI bleed. Stomach appears normal. The appendix is visualized and is within normal limits. No bowel wall thickening, inflammation, or distension. Colonic diverticulosis noted. No signs of acute diverticulitis. Lymphatic: No enlarged abdominopelvic lymph nodes. Reproductive: Prostate is unremarkable. Other: New abdominal ascites identified with free fluid extending over the liver and spleen. No discrete fluid collections. No signs of hematoma. Musculoskeletal: No acute or significant osseous findings. IMPRESSION: 1. No signs of intraluminal contrast extravasation within the small or large bowel loops to suggest active GI bleed. 2. 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 New abdominal ascites. Findings may reflect progressive portal venous hypertension. 4. Splenomegaly.  Stable from previous exam 5. Nonobstructing left renal calculus. Electronically Signed   By: Signa Kell M.D.   On: 07/12/2022 09:44    Labs:  CBC: Recent Labs    07/13/22 0618 07/14/22 0735  07/14/22 1726 07/15/22 0347 07/15/22 1415  WBC 12.6* 10.7* 8.3 9.8  --   HGB 7.5* 7.4* 7.6* 6.7* 8.2*  HCT 23.1* 22.9* 23.7* 20.9* 25.1*  PLT 229 247 242 243  --     COAGS: Recent Labs    07/12/22 0835  INR 1.2  APTT 27    BMP: Recent Labs    07/12/22 1703 07/13/22 0618 07/14/22 0735 07/15/22 0347  NA 140 137 137 137  K 3.6 3.4* 3.2* 3.8  CL 111 107 110 108  CO2 18* 23 21* 22  GLUCOSE 94 114* 111* 132*  BUN 15 15 9 10   CALCIUM 7.7* 7.4* 7.9* 8.1*  CREATININE 0.96 1.04 0.86 0.84  GFRNONAA >60 >60 >60 >60    LIVER FUNCTION TESTS: Recent Labs    07/11/22 1646 07/12/22 1703 07/13/22 0618 07/14/22 0735  BILITOT 0.4 1.0 0.7 0.6  AST 19 20 17 17   ALT 20 17 17 17   ALKPHOS 46 40 36* 39  PROT 5.3* 4.9* 4.3* 4.9*  ALBUMIN 2.9* 2.8* 2.3* 2.5*    TUMOR MARKERS: No results for input(s): "AFPTM", "CEA", "CA199", "CHROMGRNA" in the last 8760 hours.  Assessment and Plan:  42 y/o M with non-cirrhotic chronic portal vein thrombosis 2/2 acute portal thrombosis complicated by portal HTN and anemia/melena who presented to the ED 5/13 with acute GI bleeding. On admission hgb 4.8 which improved to 5.7 post transfusion, ultimately reaching 9.5 at one point however he has continued to have a steady drop in hgb despite multiple transfusions and no obvious hematochezia. EGD/capsule endoscopy/colonoscopy all performed without source of bleeding found. CTA abdomen did not show active bleeding. IR has been consulted to discuss possible inpatient TIPS creation to reduce venous congestion in hopes of stopping ongoing GI bleeding.  Patient seen in outpatient clinic for same on 06/02/22 and deemed a TIPS candidate. Reviewed procedure indications, risks including but not limited to bleeding, vascular damage and hepatic encephalopathy, benefits and alternatives today with patient and family and they are all in agreement to proceed. Discussed proceeding early next week while admitted vs potential  outpatient procedure if hgb stabilizes. Patient understandably hesitant about returning home with continued drop in hgb and would prefer inpatient TIPS.  Will plan for TIPS creation early next week under general anesthesia, they are aware timing will be based on anesthesia/IR availability. When date determined  IR APP will place appropriate orders/communicate with primary team.  Risks and benefits of TIPS, BRTO and/or additional variceal embolization were discussed with the patient and/or the patient's family including, but not limited to, infection, bleeding, damage to adjacent structures, worsening hepatic and/or cardiac function, worsening and/or the development of altered mental status/encephalopathy, non-target embolization and death.   This interventional procedure involves the use of X-rays and because of the nature of the planned procedure, it is possible that we will have prolonged use of X-ray fluoroscopy.  Potential radiation risks to you include (but are not limited to) the following: - A slightly elevated risk for cancer  several years later in life. This risk is typically less than 0.5% percent. This risk is low in comparison to the normal incidence of human cancer, which is 33% for women and 50% for men according to the [employer] Society. - Radiation induced injury can include skin redness, resembling a rash, tissue breakdown / ulcers and hair loss (which can be temporary or permanent).   The likelihood of either of these occurring depends on the difficulty of the procedure and whether you are sensitive to radiation due to previous procedures, disease, or genetic conditions.   IF your procedure requires a prolonged use of radiation, you will be notified and given written instructions for further action.  It is your responsibility to monitor the irradiated area for the 2 weeks following the procedure and to notify your physician if you are concerned that you have suffered a  radiation induced injury.    All of the patient's questions were answered, patient is agreeable to proceed.  Consent signed and in chart.  Thank you for this interesting consult.  I greatly enjoyed meeting Nathan Rowland and look forward to participating in their care.  A copy of this report was sent to the requesting provider on this date.  Electronically Signed: Villa Herb, PA-C 07/15/2022, 3:13 PM   I spent a total of 55 Miinutes in face to face in clinical consultation, greater than 50% of which was counseling/coordinating care for GI bleeding.

## 2022-07-15 NOTE — Progress Notes (Signed)
[facility] Progress Note  Chief Complaint: Noncirrhotic portal hypertension, esophageal varices, melena  History:  Nathan Rowland feels pretty well today.  He is discouraged by another drop in his hemoglobin, though he has not had melena in about 48 hours.  He denies abdominal pain, he denies chest pain, his cough and breathing have improved since the aspiration a few days ago.   Objective:   Current Facility-Administered Medications:    0.9 %  sodium chloride infusion (Manually program via Guardrails IV Fluids), , Intravenous, Once, Nathan Rowland, Nathan Lake III, MD   acetaminophen (TYLENOL) tablet 650 mg, 650 mg, Oral, Q6H PRN, Nathan Bees, MD, 650 mg at 07/15/22 0908   cefTRIAXone (ROCEPHIN) 1 g in sodium chloride 0.9 % 100 mL IVPB, 1 g, Intravenous, Q2200, Nathan Pitter III, MD, Last Rate: 200 mL/hr at 07/14/22 2330, 1 g at 07/14/22 2330   loperamide (IMODIUM) capsule 2 mg, 2 mg, Oral, PRN, Nathan Bees, MD, 2 mg at 07/15/22 0908   metroNIDAZOLE (FLAGYL) IVPB 500 mg, 500 mg, Intravenous, Q12H, Rowland, Nathan Curtis, MD, Last Rate: 100 mL/hr at 07/15/22 1610, 500 mg at 07/15/22 0627   ondansetron (ZOFRAN) injection 4 mg, 4 mg, Intravenous, Q6H PRN, Rowland, Nathan Lean, MD  Facility-Administered Medications Ordered in Other Encounters:    acetaminophen (TYLENOL) tablet 650 mg, 650 mg, Oral, Once, Nathan Rowland, Nathan Rowland   diphenhydrAMINE (BENADRYL) capsule 25 mg, 25 mg, Oral, Once, Nathan Rowland, Nathan Rowland   iron sucrose (VENOFER) 200 mg in sodium chloride 0.9 % 100 mL IVPB, 200 mg, Intravenous, Once, Nathan Rowland, Nathan Rowland   cefTRIAXone (ROCEPHIN)  IV 1 g (07/14/22 2330)   metronidazole 500 mg (07/15/22 9604)     Vital signs in last 24 hrs: Vitals:   07/15/22 1229 07/15/22 1655  BP: 132/89 116/79  Pulse: (!) 105   Resp: 19 19  Temp: 98.3 F (36.8 C)   SpO2: 98% 96%    Intake/Output Summary (Last 24 hours) at 07/15/2022 1748 Last data filed at 07/15/2022 1229 Gross per 24 hour  Intake 352 ml   Output --  Net 352 ml     Physical Exam  HEENT: sclera anicteric, oral mucosa without lesions Neck: supple, no thyromegaly, JVD or lymphadenopathy Cardiac: RRR without murmurs, S1S2 heard, no peripheral edema Pulm: clear to auscultation bilaterally, normal RR and effort noted Abdomen: soft, no tenderness, with active bowel sounds. No guarding or palpable hepatosplenomegaly Skin; warm and dry, no jaundice  Recent Labs:     Latest Ref Rng & Units 07/15/2022    2:15 PM 07/15/2022    3:47 AM 07/14/2022    5:26 PM  CBC  WBC 4.0 - 10.5 K/uL  9.8  8.3   Hemoglobin 13.0 - 17.0 g/dL 8.2  6.7  7.6   Hematocrit 39.0 - 52.0 % 25.1  20.9  23.7   Platelets 150 - 400 K/uL  243  242     Recent Labs  Lab 07/12/22 0835  INR 1.2      Latest Ref Rng & Units 07/15/2022    3:47 AM 07/14/2022    7:35 AM 07/13/2022    6:18 AM  CMP  Glucose 70 - 99 mg/dL 540  981  191   BUN 6 - 20 mg/dL 10  9  15    Creatinine 0.61 - 1.24 mg/dL 4.78  2.95  6.21   Sodium 135 - 145 mmol/L 137  137  137   Potassium 3.5 - 5.1 mmol/L 3.8  3.2  3.4  Chloride 98 - 111 mmol/L 108  110  107   CO2 22 - 32 mmol/L 22  21  23    Calcium 8.9 - 10.3 mg/dL 8.1  7.9  7.4   Total Protein 6.5 - 8.1 g/dL  4.9  4.3   Total Bilirubin 0.3 - 1.2 mg/dL  0.6  0.7   Alkaline Phos 38 - 126 U/L  39  36   AST 15 - 41 U/L  17  17   ALT 0 - 44 U/L  17  17      Radiologic studies:   Assessment & Plan  Assessment: Melena Acute blood loss anemia 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  Currently off anticoagulation because of acute GI bleeding.  He has had intermittent melena for the last 2 weeks.  No clear source on small bowel enteroscopy, video capsule study and colonoscopy.  The bleeding is either coming from obscure source in the small bowel or perhaps slow  intermittent oozing from his esophageal varices.  However, no clear stigmata of bleeding seen on those varices during enteroscopy 3 days ago.  I had another long discussion with him and his family and waited to visit him until he had been seen by Dr. Elby Rowland of interventional radiology earlier today.  IR consultation and comprehensive note greatly appreciated.  (I have had multiple discussions  with Dr. Elby Rowland about this case every day this week). They are offering him what will likely be a trans-splenic approach to a TIPS placement next week.  That is the soonest they will have the appropriate equipment, staffing and anesthesia support to undertake this.  With that plan, I have elected not to perform banding of the esophageal varices.  Although they are a potential source of the recent bleeding, if banding were to precipitate brisk upper GI bleeding, we would have no salvage interventional procedure available unless Dr. Elby Rowland was on duty.  And even then, the procedure would need to be done emergently on a patient who could be unstable rather than electively in a stable patient.  In addition, if IR  is successful at their planned elective procedure, then esophageal variceal banding will not be necessary.  Although Nathan Rowland is still having some intermittent obscure GI bleeding, we can support him through that with PRBCs.  Should he develop brisk variceal bleeding, then an emergent upper endoscopy would have to be performed.  They asked me some questions and had understandable concerns about the plan but are confident that we have the right approach.  Nathan Rowland needs to be monitored in the hospital until he undergoes his IR procedure.  I have put him back on a regular diet, he will continue ceftriaxone for the appropriate treatment course for aspiration pneumonia as determined by the primary medical service.  I also discontinued the Flagyl.  Twice daily CBCs, transfuse as needed.  We will see him daily while he  is here.   40 minutes were spent on this encounter (including chart review, history/exam, counseling/coordination of care, and documentation) > 50% of that time was spent on counseling and coordination of care.   Nathan Rowland Office: [phone]

## 2022-07-15 NOTE — Progress Notes (Signed)
PROGRESS NOTE        PATIENT DETAILS Name: Nathan Rowland Age: 42 y.o. Sex: male Date of Birth: 19-Apr-1980 Admit Date: 07/11/2022 Admitting Physician John Giovanni, MD [id number], Dwana Curd, MD  Brief Summary: Patient is a 42 y.o.  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  Significant events: 5/12>> admit to [facility] 5/13>> EGD/enteroscopy-no obvious bleeding.  Developed chills/hypoxia post EGD-PCCM consulted-found to have left lower lung infiltrate on CT.  Significant studies: 5/13>> CT angio GI bleed: No of contrast extravasation to suggest active GI bleed. 5/13>> CT angio chest: Left lower lobe consolidation-several focal hypodense areas within the segmental/subsegmental right upper lobe/right lower lobe-indeterminate for small PE.  Significant microbiology data: 5/13>> blood cultures: No cultures.  Procedures: 5/13>> small bowel enteroscopy 5/14>> video capsule endoscopy: No bleeding seen. 5/15>> colonoscopy: Blood in the terminal ileum/entire colon.  Consults: GI PCCM  Subjective: No melena/hematochezia but hemoglobin downtrending.  He is frustrated.  Objective: Vitals: Blood pressure 120/74, pulse 97, temperature 98 F (36.7 C), temperature source Oral, resp. rate 20, height 6' (1.829 m), weight 108.9 kg, SpO2 95 %.   Exam: Gen Exam:Alert awake-not in any distress HEENT:atraumatic, normocephalic Chest: B/L clear to auscultation anteriorly CVS:S1S2 regular Abdomen:soft non tender, non distended Extremities:no edema Neurology: Non focal Skin: no rash  Pertinent Labs/Radiology:    Latest Ref Rng & Units 07/15/2022    3:47 AM 07/14/2022    5:26 PM 07/14/2022    7:35 AM  CBC  WBC 4.0 - 10.5 K/uL 9.8  8.3  10.7   Hemoglobin 13.0 - 17.0 g/dL 6.7  7.6  7.4    Hematocrit 39.0 - 52.0 % 20.9  23.7  22.9   Platelets 150 - 400 K/uL 243  242  247     Lab Results  Component Value Date   NA 137 07/15/2022   K 3.8 07/15/2022   CL 108 07/15/2022   CO2 22 07/15/2022     Assessment/Plan: Upper GI bleeding with acute blood loss anemia Source of bleeding not evident-in spite of extensive evaluation Workup as above-colonoscopy with blood in terminal ileum/colon Significant drop in hemoglobin today-being transfused 1 additional unit of PRBC Discussed with GI MD-Dr. Macon Large to consult today-for possible stenting.  Acute hypoxic respiratory failure Aspiration pneumonia Hypoxia on 5/13-felt to be aspiration pneumonia-unlikely to be TRALI Rapidly improved and now on room air Rocephin/Flagyl x 5 days total.    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   Obesity: Estimated body mass index is 32.55 kg/m as calculated from the following:   Height as of this encounter: 6' (1.829 m).   Weight as of this encounter: 108.9 kg.   Code status:   Code Status: Full Code   DVT Prophylaxis: SCDs Start: 07/12/22 0216   Family Communication: None at bedside   Disposition Plan: Status is: Inpatient Remains inpatient appropriate because: Severity of illness   Planned Discharge Destination:Home   Diet: Diet Order             Diet full liquid Room service appropriate? Yes; Fluid consistency: Thin  Diet effective now  Antimicrobial agents: Anti-infectives (From admission, onward)    Start     Dose/Rate Route Frequency Ordered Stop   07/12/22 1900  metroNIDAZOLE (FLAGYL) IVPB 500 mg        500 mg 100 mL/hr over 60 Minutes Intravenous Every 12 hours 07/12/22 1812     07/12/22 0200  cefTRIAXone (ROCEPHIN) 1 g in sodium chloride 0.9 % 100 mL IVPB        1 g 200  mL/hr over 30 Minutes Intravenous Daily at 10 pm 07/12/22 0158          MEDICATIONS: Scheduled Meds:  sodium chloride   Intravenous Once   sodium chloride   Intravenous Once   furosemide  20 mg Intravenous Once   Continuous Infusions:  cefTRIAXone (ROCEPHIN)  IV 1 g (07/14/22 2330)   metronidazole 500 mg (07/15/22 0627)   PRN Meds:.acetaminophen, loperamide, ondansetron (ZOFRAN) IV   I have personally reviewed following labs and imaging studies  LABORATORY DATA: CBC: Recent Labs  Lab 07/12/22 1703 07/12/22 2217 07/13/22 0618 07/14/22 0735 07/14/22 1726 07/15/22 0347  WBC 10.9*  --  12.6* 10.7* 8.3 9.8  HGB 9.5* 8.1* 7.5* 7.4* 7.6* 6.7*  HCT 30.3*  --  23.1* 22.9* 23.7* 20.9*  MCV 90.4  --  87.8 88.1 87.5 87.8  PLT 363  --  229 247 242 243     Basic Metabolic Panel: Recent Labs  Lab 07/11/22 1646 07/12/22 1703 07/13/22 0618 07/14/22 0735 07/15/22 0347  NA 138 140 137 137 137  K 4.0 3.6 3.4* 3.2* 3.8  CL 109 111 107 110 108  CO2 21* 18* 23 21* 22  GLUCOSE 132* 94 114* 111* 132*  BUN 22* 15 15 9 10   CREATININE 0.98 0.96 1.04 0.86 0.84  CALCIUM 8.3* 7.7* 7.4* 7.9* 8.1*     GFR: Estimated Creatinine Clearance: 150.5 mL/min (by C-G formula based on SCr of 0.84 mg/dL).  Liver Function Tests: Recent Labs  Lab 07/11/22 1646 07/12/22 1703 07/13/22 0618 07/14/22 0735  AST 19 20 17 17   ALT 20 17 17 17   ALKPHOS 46 40 36* 39  BILITOT 0.4 1.0 0.7 0.6  PROT 5.3* 4.9* 4.3* 4.9*  ALBUMIN 2.9* 2.8* 2.3* 2.5*    No results for input(s): "LIPASE", "AMYLASE" in the last 168 hours. No results for input(s): "AMMONIA" in the last 168 hours.  Coagulation Profile: Recent Labs  Lab 07/12/22 0835  INR 1.2     Cardiac Enzymes: No results for input(s): "CKTOTAL", "CKMB", "CKMBINDEX", "TROPONINI" in the last 168 hours.  BNP (last 3 results) No results for input(s): "PROBNP" in the last 8760 hours.  Lipid Profile: No results for input(s): "CHOL", "HDL",  "LDLCALC", "TRIG", "CHOLHDL", "LDLDIRECT" in the last 72 hours.  Thyroid Function Tests: No results for input(s): "TSH", "T4TOTAL", "FREET4", "T3FREE", "THYROIDAB" in the last 72 hours.  Anemia Panel: No results for input(s): "VITAMINB12", "FOLATE", "FERRITIN", "TIBC", "IRON", "RETICCTPCT" in the last 72 hours.  Urine analysis:    Component Value Date/Time   COLORURINE STRAW (A) 07/12/2022 1631   APPEARANCEUR CLEAR 07/12/2022 1631   LABSPEC 1.018 07/12/2022 1631   PHURINE 5.0 07/12/2022 1631   GLUCOSEU NEGATIVE 07/12/2022 1631   HGBUR NEGATIVE 07/12/2022 1631   BILIRUBINUR NEGATIVE 07/12/2022 1631   BILIRUBINUR Neg 05/30/2012 1445   KETONESUR NEGATIVE 07/12/2022 1631   PROTEINUR NEGATIVE 07/12/2022 1631   UROBILINOGEN negative 05/30/2012 1445   NITRITE NEGATIVE 07/12/2022 1631   LEUKOCYTESUR NEGATIVE 07/12/2022 1631    Sepsis Labs: Lactic Acid,  Venous No results found for: "LATICACIDVEN"  MICROBIOLOGY: Recent Results (from the past 240 hour(s))  Culture, blood (Routine X 2) w Reflex to ID Panel     Status: None (Preliminary result)   Collection Time: 07/12/22  5:03 PM   Specimen: BLOOD RIGHT HAND  Result Value Ref Range Status   Specimen Description BLOOD RIGHT HAND  Final   Special Requests   Final    BOTTLES DRAWN AEROBIC AND ANAEROBIC Blood Culture results may not be optimal due to an inadequate volume of blood received in culture bottles   Culture   Final    NO GROWTH 3 DAYS Performed at [facility], [address] [place], [place] [address]    Report Status PENDING  Incomplete  Culture, blood (Routine X 2) w Reflex to ID Panel     Status: None (Preliminary result)   Collection Time: 07/12/22  5:04 PM   Specimen: BLOOD  Result Value Ref Range Status   Specimen Description BLOOD RIGHT ANTECUBITAL  Final   Special Requests   Final    BOTTLES DRAWN AEROBIC AND ANAEROBIC Blood Culture adequate volume   Culture   Final    NO GROWTH 3 DAYS Performed at [facility], [address] [place], [place] [address]    Report Status PENDING  Incomplete    RADIOLOGY STUDIES/RESULTS: No results found.   LOS: 3 days   Jeoffrey Massed, MD  [facility]    To contact the attending provider between 7A-7P or the covering provider during after hours 7P-7A, please log into the web site [email or web address] and access using universal [place] password for that web site. If you do not have the password, please call the hospital operator.  07/15/2022, 11:44 AM

## 2022-07-16 DIAGNOSIS — K766 Portal hypertension: Secondary | ICD-10-CM | POA: Diagnosis not present

## 2022-07-16 DIAGNOSIS — K922 Gastrointestinal hemorrhage, unspecified: Secondary | ICD-10-CM | POA: Diagnosis not present

## 2022-07-16 DIAGNOSIS — D62 Acute posthemorrhagic anemia: Secondary | ICD-10-CM | POA: Diagnosis not present

## 2022-07-16 DIAGNOSIS — K921 Melena: Secondary | ICD-10-CM | POA: Diagnosis not present

## 2022-07-16 DIAGNOSIS — Z7901 Long term (current) use of anticoagulants: Secondary | ICD-10-CM | POA: Diagnosis not present

## 2022-07-16 LAB — CBC
HCT: 21.8 % — ABNORMAL LOW (ref 39.0–52.0)
HCT: 26 % — ABNORMAL LOW (ref 39.0–52.0)
Hemoglobin: 7.1 g/dL — ABNORMAL LOW (ref 13.0–17.0)
Hemoglobin: 8.3 g/dL — ABNORMAL LOW (ref 13.0–17.0)
MCH: 27.7 pg (ref 26.0–34.0)
MCH: 27.8 pg (ref 26.0–34.0)
MCHC: 32.6 g/dL (ref 30.0–36.0)
MCHC: 31.9 g/dL (ref 30.0–36.0)
MCV: 85.2 fL (ref 80.0–100.0)
MCV: 87 fL (ref 80.0–100.0)
Platelets: 251 10*3/uL (ref 150–400)
Platelets: 289 10*3/uL (ref 150–400)
RBC: 2.56 MIL/uL — ABNORMAL LOW (ref 4.22–5.81)
RBC: 2.99 MIL/uL — ABNORMAL LOW (ref 4.22–5.81)
RDW: 15 % (ref 11.5–15.5)
RDW: 15.2 % (ref 11.5–15.5)
WBC: 7 10*3/uL (ref 4.0–10.5)
WBC: 9.3 10*3/uL (ref 4.0–10.5)
nRBC: 0 % (ref 0.0–0.2)
nRBC: 0.2 % (ref 0.0–0.2)

## 2022-07-16 LAB — BPAM RBC: Blood Product Expiration Date: 202406142359

## 2022-07-16 LAB — TYPE AND SCREEN: ABO/RH(D): A POS

## 2022-07-16 LAB — CULTURE, BLOOD (ROUTINE X 2)

## 2022-07-16 NOTE — Progress Notes (Signed)
PROGRESS NOTE        PATIENT DETAILS Name: Nathan Rowland Age: 42 y.o. Sex: male Date of Birth: [birth year] Admit Date: 07/11/2022 Admitting Physician John Giovanni, MD [id number], Dwana Curd, MD  Brief Summary: Patient is a 42 y.o.  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  Significant events: 5/12>> admit to [facility] 5/13>> EGD/enteroscopy-no obvious bleeding.  Developed chills/hypoxia post EGD-PCCM consulted-found to have left lower lung infiltrate on CT.  Significant studies: 5/13>> CT angio GI bleed: No of contrast extravasation to suggest active GI bleed. 5/13>> CT angio chest: Left lower lobe consolidation-several focal hypodense areas within the segmental/subsegmental right upper lobe/right lower lobe-indeterminate for small PE.  Significant microbiology data: 5/13>> blood cultures: No cultures.  Procedures: 5/13>> small bowel enteroscopy 5/14>> video capsule endoscopy: No bleeding seen. 5/15>> colonoscopy: Blood in the terminal ileum/entire colon.  Consults: GI PCCM  Subjective: Some dark-colored stools overnight.  No hematuria GI.  Objective: Vitals: Blood pressure 119/76, pulse 94, temperature 97.7 F (36.5 C), temperature source Oral, resp. rate (!) 21, height 6' (1.829 m), weight 108.9 kg, SpO2 94 %.   Exam: Gen Exam:Alert awake-not in any distress HEENT:atraumatic, normocephalic Chest: B/L clear to auscultation anteriorly CVS:S1S2 regular Abdomen:soft non tender, non distended Extremities:no edema Neurology: Non focal Skin: no rash  Pertinent Labs/Radiology:    Latest Ref Rng & Units 07/16/2022    6:16 AM 07/15/2022    2:15 PM 07/15/2022    3:47 AM  CBC  WBC 4.0 - 10.5 K/uL 7.0   9.8   Hemoglobin 13.0 - 17.0 g/dL 7.1  8.2  6.7   Hematocrit 39.0  - 52.0 % 21.8  25.1  20.9   Platelets 150 - 400 K/uL 251   243     Lab Results  Component Value Date   NA 137 07/15/2022   K 3.8 07/15/2022   CL 108 07/15/2022   CO2 22 07/15/2022     Assessment/Plan: Upper GI bleeding with acute blood loss anemia Source of bleeding not evident-but suspicion for possible oozing from varices or from a obscure slow small bowel bleeding site-given overall hemodynamic stability. Workup as above IR planning TIPS procedure on Monday-will remain inpatient until TIPS procedure is completed. Monitor closely-if develops significant GI bleeding-GI will need to be consulted for banding.  Follow CBC every 12 hours-transfuse as needed has required total of 6 units of PRBC so far-last unit on 5/16).  Acute hypoxic respiratory failure Aspiration pneumonia Hypoxia on 5/13-felt to be aspiration pneumonia-unlikely to be TRALI Rapidly improved and now on room air Has completed a course of antibiotic.  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 IR consulted-see above  Obesity: Estimated body mass index is 32.55 kg/m as calculated from the following:   Height as of this encounter: 6' (1.829 m).   Weight as of this encounter: 108.9 kg.   Code status:   Code Status: Full Code   DVT Prophylaxis: SCDs Start: 07/12/22 0216   Family Communication: None at bedside   Disposition Plan: Status is: Inpatient Remains inpatient appropriate because: Severity of illness   Planned Discharge Destination:Home   Diet: Diet Order  Diet 2 gram sodium Room service appropriate? Yes; Fluid consistency: Thin  Diet effective now                     Antimicrobial agents: Anti-infectives (From admission, onward)    Start     Dose/Rate Route Frequency Ordered Stop    07/12/22 1900  metroNIDAZOLE (FLAGYL) IVPB 500 mg  Status:  Discontinued        500 mg 100 mL/hr over 60 Minutes Intravenous Every 12 hours 07/12/22 1812 07/15/22 1749   07/12/22 0200  cefTRIAXone (ROCEPHIN) 1 g in sodium chloride 0.9 % 100 mL IVPB        1 g 200 mL/hr over 30 Minutes Intravenous Daily at 10 pm 07/12/22 0158          MEDICATIONS: Scheduled Meds:  sodium chloride   Intravenous Once   Continuous Infusions:  cefTRIAXone (ROCEPHIN)  IV Stopped (07/15/22 2125)   PRN Meds:.acetaminophen, loperamide, ondansetron (ZOFRAN) IV   I have personally reviewed following labs and imaging studies  LABORATORY DATA: CBC: Recent Labs  Lab 07/13/22 0618 07/14/22 0735 07/14/22 1726 07/15/22 0347 07/15/22 1415 07/16/22 0616  WBC 12.6* 10.7* 8.3 9.8  --  7.0  HGB 7.5* 7.4* 7.6* 6.7* 8.2* 7.1*  HCT 23.1* 22.9* 23.7* 20.9* 25.1* 21.8*  MCV 87.8 88.1 87.5 87.8  --  85.2  PLT 229 247 242 243  --  251     Basic Metabolic Panel: Recent Labs  Lab 07/11/22 1646 07/12/22 1703 07/13/22 0618 07/14/22 0735 07/15/22 0347  NA 138 140 137 137 137  K 4.0 3.6 3.4* 3.2* 3.8  CL 109 111 107 110 108  CO2 21* 18* 23 21* 22  GLUCOSE 132* 94 114* 111* 132*  BUN 22* 15 15 9 10   CREATININE 0.98 0.96 1.04 0.86 0.84  CALCIUM 8.3* 7.7* 7.4* 7.9* 8.1*     GFR: Estimated Creatinine Clearance: 150.5 mL/min (by C-G formula based on SCr of 0.84 mg/dL).  Liver Function Tests: Recent Labs  Lab 07/11/22 1646 07/12/22 1703 07/13/22 0618 07/14/22 0735  AST 19 20 17 17   ALT 20 17 17 17   ALKPHOS 46 40 36* 39  BILITOT 0.4 1.0 0.7 0.6  PROT 5.3* 4.9* 4.3* 4.9*  ALBUMIN 2.9* 2.8* 2.3* 2.5*    No results for input(s): "LIPASE", "AMYLASE" in the last 168 hours. No results for input(s): "AMMONIA" in the last 168 hours.  Coagulation Profile: Recent Labs  Lab 07/12/22 0835  INR 1.2     Cardiac Enzymes: No results for input(s): "CKTOTAL", "CKMB", "CKMBINDEX", "TROPONINI" in the last  168 hours.  BNP (last 3 results) No results for input(s): "PROBNP" in the last 8760 hours.  Lipid Profile: No results for input(s): "CHOL", "HDL", "LDLCALC", "TRIG", "CHOLHDL", "LDLDIRECT" in the last 72 hours.  Thyroid Function Tests: No results for input(s): "TSH", "T4TOTAL", "FREET4", "T3FREE", "THYROIDAB" in the last 72 hours.  Anemia Panel: No results for input(s): "VITAMINB12", "FOLATE", "FERRITIN", "TIBC", "IRON", "RETICCTPCT" in the last 72 hours.  Urine analysis:    Component Value Date/Time   COLORURINE STRAW (A) 07/12/2022 1631   APPEARANCEUR CLEAR 07/12/2022 1631   LABSPEC 1.018 07/12/2022 1631   PHURINE 5.0 07/12/2022 1631   GLUCOSEU NEGATIVE 07/12/2022 1631   HGBUR NEGATIVE 07/12/2022 1631   BILIRUBINUR NEGATIVE 07/12/2022 1631   BILIRUBINUR Neg 05/30/2012 1445   KETONESUR NEGATIVE 07/12/2022 1631   PROTEINUR NEGATIVE 07/12/2022 1631   UROBILINOGEN negative 05/30/2012 1445   NITRITE NEGATIVE 07/12/2022 1631  LEUKOCYTESUR NEGATIVE 07/12/2022 1631    Sepsis Labs: Lactic Acid, Venous No results found for: "LATICACIDVEN"  MICROBIOLOGY: Recent Results (from the past 240 hour(s))  Culture, blood (Routine X 2) w Reflex to ID Panel     Status: None (Preliminary result)   Collection Time: 07/12/22  5:03 PM   Specimen: BLOOD RIGHT HAND  Result Value Ref Range Status   Specimen Description BLOOD RIGHT HAND  Final   Special Requests   Final    BOTTLES DRAWN AEROBIC AND ANAEROBIC Blood Culture results may not be optimal due to an inadequate volume of blood received in culture bottles   Culture   Final    NO GROWTH 4 DAYS Performed at [facility], [address] [place], [place] [address]    Report Status PENDING  Incomplete  Culture, blood (Routine X 2) w Reflex to ID Panel     Status: None (Preliminary result)   Collection Time: 07/12/22  5:04 PM   Specimen: BLOOD  Result Value Ref Range Status   Specimen Description BLOOD RIGHT ANTECUBITAL  Final    Special Requests   Final    BOTTLES DRAWN AEROBIC AND ANAEROBIC Blood Culture adequate volume   Culture   Final    NO GROWTH 4 DAYS Performed at [facility], [address] [place], [place] [address]    Report Status PENDING  Incomplete    RADIOLOGY STUDIES/RESULTS: No results found.   LOS: 4 days   Jeoffrey Massed, MD  [facility]    To contact the attending provider between 7A-7P or the covering provider during after hours 7P-7A, please log into the web site [email or web address] and access using universal [place] password for that web site. If you do not have the password, please call the hospital operator.  07/16/2022, 11:56 AM

## 2022-07-16 NOTE — Progress Notes (Addendum)
[facility] Progress Note  CC:  Noncirrhotic portal hypertension, esophageal varices, melena   Subjective:  Hgb down to 7.1 grams today.  Had two dark stools this AM.  No new complaints.  Objective:  Vital signs in last 24 hours: Temp:  [97.7 F (36.5 C)-98.3 F (36.8 C)] 97.7 F (36.5 C) (05/17 0832) Pulse Rate:  [68-122] 94 (05/17 0832) Resp:  [10-31] 21 (05/17 0832) BP: (114-132)/(66-89) 119/76 (05/17 0832) SpO2:  [87 %-98 %] 94 % (05/17 0832) Last BM Date : 07/15/22 General:  Alert, Well-developed, in NAD Heart:  Regular rate and rhythm; no murmurs Pulm:  CTAB.  No W/R/R. Abdomen:  Soft, non-distended.  BS present.  Non-tender. Extremities:  Without edema. Neurologic:  Alert and  oriented x4;  grossly normal neurologically. Psych:  Alert and cooperative. Normal mood and affect.  Intake/Output from previous day: 05/16 0701 - 05/17 0700 In: 672 [P.O.:120; Blood:352; IV Piggyback:200] Out: -   Lab Results: Recent Labs    07/14/22 1726 07/15/22 0347 07/15/22 1415 07/16/22 0616  WBC 8.3 9.8  --  7.0  HGB 7.6* 6.7* 8.2* 7.1*  HCT 23.7* 20.9* 25.1* 21.8*  PLT 242 243  --  251   BMET Recent Labs    07/14/22 0735 07/15/22 0347  NA 137 137  K 3.2* 3.8  CL 110 108  CO2 21* 22  GLUCOSE 111* 132*  BUN 9 10  CREATININE 0.86 0.84  CALCIUM 7.9* 8.1*   LFT Recent Labs    07/14/22 0735  PROT 4.9*  ALBUMIN 2.5*  AST 17  ALT 17  ALKPHOS 39  BILITOT 0.6   Assessment / Plan: Melena Acute blood loss anemia 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  Currently off anticoagulation because of acute GI bleeding.  He has had intermittent melena for the last 2 weeks.  No clear source on small bowel enteroscopy, video capsule study and colonoscopy.  The  bleeding is either coming from obscure source in the small bowel or perhaps slow intermittent oozing from his esophageal varices.  However, no clear stigmata of bleeding seen on those varices during enteroscopy 4 days ago.   I had another long discussion with him and his family and waited to visit him until he had been seen by Dr. Elby Showers of interventional radiology earlier today.  IR consultation and comprehensive note greatly appreciated.  (I have had multiple discussions  with Dr. Elby Showers about this case every day this week). They are offering him what will likely be a trans-splenic approach to a TIPS placement next week.  That is the soonest they will have the appropriate equipment, staffing and anesthesia support to undertake this.   With that plan, I have elected not to perform banding of the esophageal varices.  Although they are a potential source of the recent bleeding, if banding were to precipitate brisk upper GI bleeding, we would have no salvage interventional procedure available unless Dr. Elby Showers was on duty.  And even then, the procedure would need to be done emergently on a patient who could be unstable rather than electively in a stable patient.  In addition, if IR  is successful at their planned elective procedure, then esophageal variceal banding will not be necessary.  Although Mamoru is still having some intermittent obscure GI bleeding, we can support him through  that with PRBCs.  Should he develop brisk variceal bleeding, then an emergent upper endoscopy would have to be performed.   Teng needs to be monitored in the hospital until he undergoes his IR procedure.  We have put him back on a regular diet, he will continue ceftriaxone for the appropriate treatment course for aspiration pneumonia as determined by the primary medical service."  ______________  Ongoing slow intermittent obscure GI bleeding related to his noncirrhotic portal hypertension.  Transfuse if hemoglobin gets  below 7.  I received a message from IR that the procedure is currently scheduled for the early afternoon on 07/20/2022.   Twice daily CBCs, transfuse as needed.  We will see him daily while he is here.     LOS: 4 days   Princella Pellegrini. Zehr  07/16/2022, 11:56 AM

## 2022-07-17 DIAGNOSIS — K922 Gastrointestinal hemorrhage, unspecified: Secondary | ICD-10-CM | POA: Diagnosis not present

## 2022-07-17 DIAGNOSIS — K766 Portal hypertension: Secondary | ICD-10-CM | POA: Diagnosis not present

## 2022-07-17 DIAGNOSIS — K921 Melena: Secondary | ICD-10-CM | POA: Diagnosis not present

## 2022-07-17 DIAGNOSIS — D62 Acute posthemorrhagic anemia: Secondary | ICD-10-CM | POA: Diagnosis not present

## 2022-07-17 LAB — BASIC METABOLIC PANEL
Anion gap: 6 (ref 5–15)
BUN: 12 mg/dL (ref 6–20)
CO2: 25 mmol/L (ref 22–32)
Calcium: 8.2 mg/dL — ABNORMAL LOW (ref 8.9–10.3)
Chloride: 107 mmol/L (ref 98–111)
Creatinine, Ser: 0.8 mg/dL (ref 0.61–1.24)
GFR, Estimated: >60 mL/min (ref >60)
Glucose, Bld: 101 mg/dL — ABNORMAL HIGH (ref 70–99)
Potassium: 3.7 mmol/L (ref 3.5–5.1)
Sodium: 138 mmol/L (ref 135–145)

## 2022-07-17 LAB — CBC
HCT: 22.5 % — ABNORMAL LOW (ref 39.0–52.0)
HCT: 24.9 % — ABNORMAL LOW (ref 39.0–52.0)
Hemoglobin: 7.3 g/dL — ABNORMAL LOW (ref 13.0–17.0)
Hemoglobin: 7.9 g/dL — ABNORMAL LOW (ref 13.0–17.0)
MCH: 27.7 pg (ref 26.0–34.0)
MCH: 27.7 pg (ref 26.0–34.0)
MCHC: 32.4 g/dL (ref 30.0–36.0)
MCHC: 31.7 g/dL (ref 30.0–36.0)
MCV: 85.2 fL (ref 80.0–100.0)
MCV: 87.4 fL (ref 80.0–100.0)
Platelets: 248 10*3/uL (ref 150–400)
Platelets: 300 10*3/uL (ref 150–400)
RBC: 2.64 MIL/uL — ABNORMAL LOW (ref 4.22–5.81)
RBC: 2.85 MIL/uL — ABNORMAL LOW (ref 4.22–5.81)
RDW: 15.2 % (ref 11.5–15.5)
RDW: 15 % (ref 11.5–15.5)
WBC: 8 10*3/uL (ref 4.0–10.5)
WBC: 10.6 10*3/uL — ABNORMAL HIGH (ref 4.0–10.5)
nRBC: 0 % (ref 0.0–0.2)
nRBC: 0.2 % (ref 0.0–0.2)

## 2022-07-17 LAB — BRAIN NATRIURETIC PEPTIDE: B Natriuretic Peptide: 14.9 pg/mL (ref 0.0–100.0)

## 2022-07-17 LAB — MAGNESIUM: Magnesium: 2 mg/dL (ref 1.7–2.4)
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
## 2022-07-17 NOTE — Progress Notes (Addendum)
[facility] Progress Note  CC:  Noncirrhotic portal hypertension, esophageal varices, melena   Subjective:  Doing well.  Just awaiting his procedure next week.  BM this AM actually not black, more just very dark brownish.  Hgb stable.  Objective:  Vital signs in last 24 hours: Temp:  [97.7 F (36.5 C)-98.6 F (37 C)] 97.9 F (36.6 C) (05/18 0400) Pulse Rate:  [79-110] 79 (05/18 0400) Resp:  [13-22] 16 (05/18 0400) BP: (111-140)/(68-88) 111/68 (05/18 0400) SpO2:  [91 %-98 %] 94 % (05/18 0400) Last BM Date : 07/15/22 General:  Alert, Well-developed, in NAD Heart:  Regular rate and rhythm; no murmurs Pulm:  CTAB.  No W/R/R. Abdomen:  Soft, non-distended.  BS present.  Non-tender.   Extremities:  Without edema. Neurologic:  Alert and oriented x 4;  grossly normal neurologically. Psych:  Alert and cooperative. Normal mood and affect.  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  Currently off anticoagulation because of acute GI bleeding.  He has had intermittent melena for the last 2 weeks.  No clear source on small bowel enteroscopy, video capsule study and colonoscopy.  The bleeding is either coming from obscure source in the small bowel or  perhaps slow intermittent oozing from his esophageal varices.  However, no clear stigmata of bleeding seen on those varices during enteroscopy 4 days ago.   I had another long discussion with him and his family and waited to visit him until he had been seen by Dr. Elby Showers of interventional radiology earlier today.  IR consultation and comprehensive note greatly appreciated.  (I have had multiple discussions  with Dr. Elby Showers about this case every day this week). They are offering him what will likely be a trans-splenic approach to a TIPS placement next week.  That is the soonest they will have the appropriate equipment, staffing and anesthesia support to undertake this.   With that plan, I have elected not to perform banding of the esophageal varices.  Although they are a potential source of the recent bleeding, if banding were to precipitate brisk upper GI bleeding, we would have no salvage interventional procedure available unless Dr. Elby Showers was on duty.  And even then, the procedure would need to be done emergently on a patient who could be unstable rather than electively in a stable patient.  In addition, if IR  is successful at their planned elective procedure, then esophageal variceal banding will not be necessary.  Although Doyal is still having some intermittent obscure GI bleeding, we can support him through that with PRBCs.  Should he develop brisk variceal bleeding, then an emergent upper endoscopy would have to be performed.  Patryck needs to be monitored in the hospital until he undergoes his IR procedure.  We have put him back on a regular diet, he will continue ceftriaxone for the appropriate treatment course for aspiration pneumonia as determined by the primary medical service."  Looks like plan is for that procedure on Tuesday afternoon. ______________   Ongoing slow intermittent obscure GI bleeding related to his noncirrhotic portal hypertension.   Transfuse if hemoglobin gets below  7.  I received a message from IR that the procedure is currently scheduled for the early afternoon on 07/20/2022.   Twice daily CBCs, transfuse as needed.  We will see him daily while he is here.    LOS: 5 days   Princella Pellegrini. Zehr  07/17/2022, 8:57 AM  I have taken an interval history, thoroughly reviewed the chart and examined the patient. I agree with the Advanced Practitioner's note, impression and recommendations, and have recorded additional findings, impressions and recommendations below. I performed a substantive portion of this encounter (>50% time spent), including a complete performance of the medical decision making.  My additional thoughts are as follows:   He looks well, his parents are at the bedside as usual.  He is in good spirits, looking forward to his procedure in a few days.  No overt bleeding since I saw him yesterday, hemoglobin fluctuating some but overall stable since yesterday.  Last transfusion 2 days ago.  Continue to monitor per previous plan.  We will see him tomorrow, Dr. Marina Goodell will be the primary contact physician for our practice this weekend should urgent issues arise.  Charlie Pitter III Office:[phone]

## 2022-07-18 ENCOUNTER — Encounter (HOSPITAL_COMMUNITY): Payer: Self-pay | Admitting: Gastroenterology

## 2022-07-18 DIAGNOSIS — K921 Melena: Secondary | ICD-10-CM | POA: Diagnosis not present

## 2022-07-18 DIAGNOSIS — K766 Portal hypertension: Secondary | ICD-10-CM | POA: Diagnosis not present

## 2022-07-18 DIAGNOSIS — D62 Acute posthemorrhagic anemia: Secondary | ICD-10-CM | POA: Diagnosis not present

## 2022-07-18 DIAGNOSIS — K922 Gastrointestinal hemorrhage, unspecified: Secondary | ICD-10-CM | POA: Diagnosis not present

## 2022-07-18 LAB — CBC
HCT: 22.2 % — ABNORMAL LOW (ref 39.0–52.0)
HCT: 22.1 % — ABNORMAL LOW (ref 39.0–52.0)
Hemoglobin: 7 g/dL — ABNORMAL LOW (ref 13.0–17.0)
Hemoglobin: 7 g/dL — ABNORMAL LOW (ref 13.0–17.0)
MCH: 26.9 pg (ref 26.0–34.0)
MCH: 27.9 pg (ref 26.0–34.0)
MCHC: 31.5 g/dL (ref 30.0–36.0)
MCHC: 31.7 g/dL (ref 30.0–36.0)
MCV: 85.4 fL (ref 80.0–100.0)
MCV: 88 fL (ref 80.0–100.0)
Platelets: 247 10*3/uL (ref 150–400)
Platelets: 260 10*3/uL (ref 150–400)
RBC: 2.6 MIL/uL — ABNORMAL LOW (ref 4.22–5.81)
RBC: 2.51 MIL/uL — ABNORMAL LOW (ref 4.22–5.81)
RDW: 14.8 % (ref 11.5–15.5)
RDW: 15 % (ref 11.5–15.5)
WBC: 8.4 10*3/uL (ref 4.0–10.5)
WBC: 8.6 10*3/uL (ref 4.0–10.5)
nRBC: 0 % (ref 0.0–0.2)
nRBC: 0 % (ref 0.0–0.2)

## 2022-07-18 LAB — BASIC METABOLIC PANEL
Anion gap: 4 — ABNORMAL LOW (ref 5–15)
BUN: 9 mg/dL (ref 6–20)
CO2: 25 mmol/L (ref 22–32)
Calcium: 8 mg/dL — ABNORMAL LOW (ref 8.9–10.3)
Chloride: 108 mmol/L (ref 98–111)
Creatinine, Ser: 0.82 mg/dL (ref 0.61–1.24)
GFR, Estimated: >60 mL/min (ref >60)
Glucose, Bld: 104 mg/dL — ABNORMAL HIGH (ref 70–99)
Potassium: 3.9 mmol/L (ref 3.5–5.1)
Sodium: 137 mmol/L (ref 135–145)

## 2022-07-18 LAB — AMMONIA: Ammonia: 14 umol/L (ref 9–35)

## 2022-07-18 LAB — BRAIN NATRIURETIC PEPTIDE: B Natriuretic Peptide: 11.1 pg/mL (ref 0.0–100.0)

## 2022-07-18 LAB — MAGNESIUM: Magnesium: 2.1 mg/dL (ref 1.7–2.4)

## 2022-07-18 MED ORDER — SODIUM CHLORIDE 0.9 % IV SOLN
50.0000 ug/h | INTRAVENOUS | Status: DC
  Administered 2022-07-18 – 2022-07-23 (×9): 50 ug/h via INTRAVENOUS
  Filled 2022-07-18 (×11): qty 1

## 2022-07-18 MED ORDER — OCTREOTIDE LOAD VIA INFUSION
50.0000 ug | Freq: Once | INTRAVENOUS | Status: AC
  Administered 2022-07-18: 50 ug via INTRAVENOUS
  Filled 2022-07-18: qty 25

## 2022-07-18 NOTE — Progress Notes (Addendum)
[facility] Progress Note  CC:  Noncirrhotic portal hypertension, esophageal varices, melena   Subjective:  Feels good.  No further sign of bleeding currently.  Hgb 7.0 grams this AM.  Objective:  Vital signs in last 24 hours: Temp:  [97.6 F (36.4 C)-99.1 F (37.3 C)] 97.6 F (36.4 C) (05/19 0345) Pulse Rate:  [90-98] 90 (05/19 0345) Resp:  [16-20] 20 (05/19 0345) BP: (116-131)/(67-82) 116/67 (05/19 0345) SpO2:  [94 %-96 %] 96 % (05/18 1900) Last BM Date : 07/15/22 General:  Alert, Well-developed, in NAD Heart:  Regular rate and rhythm; no murmurs Pulm:  No W/R/R. Abdomen:  Soft, non-distended.  BS present.  Non-tender. Extremities:  Without edema. Neurologic:  Alert and oriented x 4;  grossly normal neurologically. Psych:  Alert and cooperative. Normal mood and affect.  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  Currently off anticoagulation because of acute GI bleeding.  He has had intermittent melena for the last 2 weeks.  No clear source on small bowel enteroscopy, video capsule study and colonoscopy.  The bleeding is either coming from obscure source in the small bowel or perhaps slow intermittent oozing from his esophageal varices.  However, no clear stigmata of bleeding seen on those  varices during enteroscopy 4 days ago.   I had another long discussion with him and his family and waited to visit him until he had been seen by Dr. Elby Showers of interventional radiology earlier today.  IR consultation and comprehensive note greatly appreciated.  (I have had multiple discussions  with Dr. Elby Showers about this case every day this week). They are offering him what will likely be a trans-splenic approach to a TIPS placement next week.  That is the soonest they will have the appropriate equipment, staffing and anesthesia support to undertake this.   With that plan, I have elected not to perform banding of the esophageal varices.  Although they are a potential source of the recent bleeding, if banding were to precipitate brisk upper GI bleeding, we would have no salvage interventional procedure available unless Dr. Elby Showers was on duty.  And even then, the procedure would need to be done emergently on a patient who could be unstable rather than electively in a stable patient.  In addition, if IR  is successful at their planned elective procedure, then esophageal variceal banding will not be necessary.  Although Nathanual is still having some intermittent obscure GI bleeding, we can support him through that with PRBCs.  Should he develop brisk variceal bleeding, then an emergent upper endoscopy would have to be performed.   Morton needs to be monitored in the hospital until he undergoes his IR procedure.  We have put him back on a  regular diet, he will continue ceftriaxone for the appropriate treatment course for aspiration pneumonia as determined by the primary medical service."   Looks like plan is for that procedure on Tuesday afternoon. ______________   Ongoing slow intermittent obscure GI bleeding related to his noncirrhotic portal hypertension.   Transfuse if hemoglobin gets below 7.  I received a message from IR that the procedure is currently scheduled for the early afternoon on 07/20/2022.    Twice daily CBCs, transfuse as needed.  We will see him daily while he is here.  Octreotide gtt was discontinued or duration ran out.  Will restart this with 50 mcg bolus and then 50 mcg gtt.   LOS: 6 days   Princella Pellegrini. Zehr  07/18/2022, 11:22 AM  I have taken an interval history, thoroughly reviewed the chart and examined the patient. I agree with the Advanced Practitioner's note, impression and recommendations, and have recorded additional findings, impressions and recommendations below. I performed a substantive portion of this encounter (>50% time spent), including a complete performance of the medical decision making.  My additional thoughts are as follows:  Clinically stable.  No overt GI bleeding at present, hemoglobin stable.  His octreotide seems to have been discontinued, so we resumed it today.  Daily CBC, transfuse as needed.  Signout provided to incoming GI service for tomorrow.  IR procedure scheduled for 2 days from now.   Charlie Pitter III Office:[phone]
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
## 2022-07-19 ENCOUNTER — Other Ambulatory Visit: Payer: Self-pay | Admitting: Student

## 2022-07-19 DIAGNOSIS — Z7901 Long term (current) use of anticoagulants: Secondary | ICD-10-CM | POA: Diagnosis not present

## 2022-07-19 DIAGNOSIS — K922 Gastrointestinal hemorrhage, unspecified: Secondary | ICD-10-CM | POA: Diagnosis not present

## 2022-07-19 DIAGNOSIS — I81 Portal vein thrombosis: Secondary | ICD-10-CM | POA: Diagnosis not present

## 2022-07-19 DIAGNOSIS — K766 Portal hypertension: Secondary | ICD-10-CM | POA: Diagnosis not present

## 2022-07-19 LAB — CBC
HCT: 22.9 % — ABNORMAL LOW (ref 39.0–52.0)
HCT: 27.5 % — ABNORMAL LOW (ref 39.0–52.0)
Hemoglobin: 7.2 g/dL — ABNORMAL LOW (ref 13.0–17.0)
Hemoglobin: 8.8 g/dL — ABNORMAL LOW (ref 13.0–17.0)
MCH: 27.1 pg (ref 26.0–34.0)
MCH: 27.8 pg (ref 26.0–34.0)
MCHC: 31.4 g/dL (ref 30.0–36.0)
MCHC: 32 g/dL (ref 30.0–36.0)
MCV: 86.1 fL (ref 80.0–100.0)
MCV: 86.8 fL (ref 80.0–100.0)
Platelets: 255 10*3/uL (ref 150–400)
Platelets: 260 10*3/uL (ref 150–400)
RBC: 2.66 MIL/uL — ABNORMAL LOW (ref 4.22–5.81)
RBC: 3.17 MIL/uL — ABNORMAL LOW (ref 4.22–5.81)
RDW: 15 % (ref 11.5–15.5)
RDW: 14.9 % (ref 11.5–15.5)
WBC: 8.6 10*3/uL (ref 4.0–10.5)
WBC: 8.6 10*3/uL (ref 4.0–10.5)
nRBC: 0.2 % (ref 0.0–0.2)
nRBC: 0 % (ref 0.0–0.2)

## 2022-07-19 LAB — BASIC METABOLIC PANEL
Anion gap: 6 (ref 5–15)
BUN: 10 mg/dL (ref 6–20)
CO2: 25 mmol/L (ref 22–32)
Calcium: 8.2 mg/dL — ABNORMAL LOW (ref 8.9–10.3)
Chloride: 106 mmol/L (ref 98–111)
Creatinine, Ser: 0.93 mg/dL (ref 0.61–1.24)
GFR, Estimated: >60 mL/min (ref >60)
Glucose, Bld: 115 mg/dL — ABNORMAL HIGH (ref 70–99)
Potassium: 4.4 mmol/L (ref 3.5–5.1)
Sodium: 137 mmol/L (ref 135–145)

## 2022-07-19 LAB — TYPE AND SCREEN: ABO/RH(D): A POS

## 2022-07-19 LAB — BPAM RBC
Blood Product Expiration Date: 202406122359
ISSUE DATE / TIME: 202405200947

## 2022-07-19 LAB — BRAIN NATRIURETIC PEPTIDE: B Natriuretic Peptide: 16.8 pg/mL (ref 0.0–100.0)

## 2022-07-19 LAB — PREPARE RBC (CROSSMATCH)

## 2022-07-19 LAB — HEMOGLOBIN AND HEMATOCRIT, BLOOD
HCT: 28.7 % — ABNORMAL LOW (ref 39.0–52.0)
Hemoglobin: 8.9 g/dL — ABNORMAL LOW (ref 13.0–17.0)

## 2022-07-19 LAB — MAGNESIUM: Magnesium: 2.2 mg/dL (ref 1.7–2.4)

## 2022-07-19 LAB — AMMONIA: Ammonia: 15 umol/L (ref 9–35)

## 2022-07-19 MED ORDER — DIPHENHYDRAMINE HCL 50 MG/ML IJ SOLN: 25.0000 mg | Freq: Once | INTRAMUSCULAR | Status: AC

## 2022-07-19 MED ORDER — OCTREOTIDE LOAD VIA INFUSION
50.0000 ug | Freq: Once | INTRAVENOUS | Status: AC
  Administered 2022-07-19: 50 ug via INTRAVENOUS
  Filled 2022-07-19: qty 25

## 2022-07-19 MED ORDER — DIPHENHYDRAMINE HCL 50 MG/ML IJ SOLN
INTRAMUSCULAR | Status: AC
  Administered 2022-07-19: 25 mg via INTRAVENOUS
  Filled 2022-07-19: qty 1

## 2022-07-19 MED ORDER — SODIUM CHLORIDE 0.9% IV SOLUTION: Freq: Once | INTRAVENOUS | Status: AC

## 2022-07-19 MED ORDER — DIPHENHYDRAMINE HCL 50 MG/ML IJ SOLN
INTRAMUSCULAR | Status: AC
  Filled 2022-07-19: qty 1

## 2022-07-19 MED ORDER — SODIUM CHLORIDE 0.9 % IV SOLN
50.0000 ug/h | INTRAVENOUS | Status: DC
  Administered 2022-07-19: 50 ug/h via INTRAVENOUS
  Filled 2022-07-19 (×2): qty 1

## 2022-07-19 MED ORDER — SODIUM CHLORIDE 0.9 % IV SOLN: 25.0000 mg | Freq: Four times a day (QID) | INTRAVENOUS | Status: DC | PRN

## 2022-07-19 NOTE — Progress Notes (Signed)
Progress Note   LOS: 7 days   Chief Complaint:Noncirrhotic portal hypertension, esophageal varices, melena    Subjective   Patient states he is doing well. No further signs of bleeding. Tolerating diet without difficulty. Denies abdominal pain, nausea, or vomiting.  Patient with family at bedside, parents. Provided some of the history.    Objective   Vital signs in last 24 hours: Temp:  [98 F (36.7 C)-99.1 F (37.3 C)] 98.3 F (36.8 C) (05/20 1101) Pulse Rate:  [86-97] 89 (05/20 1101) Resp:  [12-20] 18 (05/20 1101) BP: (101-119)/(62-74) 112/67 (05/20 1101) SpO2:  [92 %-96 %] 94 % (05/20 1101) Last BM Date : 07/18/22 Last BM recorded by nurses in past 5 days No data recorded  General:   male in no acute distress  Heart:  Regular rate and rhythm; no murmurs Pulm: Clear anteriorly; no wheezing Abdomen: soft, nondistended, normal bowel sounds in all quadrants. Nontender without guarding. No organomegaly appreciated. Extremities:  No edema Neurologic:  Alert and  oriented x4;  No focal deficits.  Psych:  Cooperative. Normal mood and affect.  Intake/Output from previous day: 05/19 0701 - 05/20 0700 In: 403.3 [I.V.:403.3] Out: -  Intake/Output this shift: No intake/output data recorded.  Studies/Results: No results found.  Lab Results: Recent Labs    07/18/22 0547 07/18/22 1735 07/19/22 0543  WBC 8.4 8.6 8.6  HGB 7.0* 7.0* 7.2*  HCT 22.2* 22.1* 22.9*  PLT 247 260 255   BMET Recent Labs    07/17/22 0607 07/18/22 0547 07/19/22 0543  NA 138 137 137  K 3.7 3.9 4.4  CL 107 108 106  CO2 25 25 25   GLUCOSE 101* 104* 115*  BUN 12 9 10   CREATININE 0.80 0.82 0.93  CALCIUM 8.2* 8.0* 8.2*   LFT No results for input(s): "PROT", "ALBUMIN", "AST", "ALT", "ALKPHOS", "BILITOT", "BILIDIR", "IBILI" in the last 72 hours. PT/INR No results for input(s): "LABPROT", "INR" in the last 72 hours.   Scheduled Meds:  sodium chloride   Intravenous Once   Continuous  Infusions:  diphenhydrAMINE     octreotide (SANDOSTATIN) 500 mcg in sodium chloride 0.9 % 250 mL (2 mcg/mL) infusion 50 mcg/hr (07/19/22 1031)   octreotide (SANDOSTATIN) 500 mcg in sodium chloride 0.9 % 250 mL (2 mcg/mL) infusion Stopped (07/19/22 1028)     Impression:   Melena Acute blood loss anemia 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 -Hgb 7.2, stable (has received 6 units PRBCs thus far, 1 unit PRBCs planned for today) -BUN 10, creatinine 0.93  Summarized previous note "Previous portal vein thrombosis with acavernous transformation of the portal vein. Currently off anticoagulation because of acute GI bleeding. He has had intermittent melena for the last 2 weeks.  No clear source on small bowel enteroscopy, video capsule study, and colonoscopy.  Bleeding possibly oozing from esophageal varices.  IR planning transplant encouraged to a TIPS placement Tuesday of this week  Endoscopic banding of esophageal varices is being being avoided due to risk of brisk upper GI bleeding and no salvage interventional procedure available unless Dr. Elby Showers on duty. And even then, the procedure would need to be done emergently on a patient who could be unstable rather than electively in a stable patient. In addition, if IR is successful at their planned elective procedure, then esophageal variceal banding will not be necessary "    Plan:   -Continue with plan for TIPS 5/21 -Continue to hold  Eliquis -Continue daily CBC and transfuse as needed to maintain HGB > 7  -Continue octreotide  Kaeleigh Westendorf M Saharah Sherrow  07/19/2022, 1:43 PM

## 2022-07-19 NOTE — Anesthesia Preprocedure Evaluation (Signed)
Anesthesia Evaluation  Patient identified by MRN, date of birth, ID band Patient awake    Reviewed: Allergy & Precautions, H&P , NPO status , Patient's Chart, lab work & pertinent test results  Airway Mallampati: II  TM Distance: >3 FB Neck ROM: Full    Dental no notable dental hx. (+) Teeth Intact, Dental Advisory Given   Pulmonary neg pulmonary ROS   Pulmonary exam normal breath sounds clear to auscultation       Cardiovascular Exercise Tolerance: Good negative cardio ROS  Rhythm:Regular Rate:Normal     Neuro/Psych negative neurological ROS  negative psych ROS   GI/Hepatic negative GI ROS,,,(+)   Esophageal Varices      Endo/Other  negative endocrine ROS    Renal/GU negative Renal ROS  negative genitourinary   Musculoskeletal   Abdominal   Peds  Hematology  (+) Blood dyscrasia, anemia   Anesthesia Other Findings   Reproductive/Obstetrics negative OB ROS                             Anesthesia Physical Anesthesia Plan  ASA: 3  Anesthesia Plan: General   Post-op Pain Management:    Induction: Intravenous  PONV Risk Score and Plan: 3 and Ondansetron, Dexamethasone and Midazolam  Airway Management Planned: Oral ETT  Additional Equipment: Arterial line  Intra-op Plan:   Post-operative Plan: Extubation in OR  Informed Consent: I have reviewed the patients History and Physical, chart, labs and discussed the procedure including the risks, benefits and alternatives for the proposed anesthesia with the patient or authorized representative who has indicated his/her understanding and acceptance.     Dental advisory given  Plan Discussed with: CRNA  Anesthesia Plan Comments: (Case cancelled by IR. Bumped by an emergency. )       Anesthesia Quick Evaluation
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
## 2022-07-20 ENCOUNTER — Inpatient Hospital Stay (HOSPITAL_COMMUNITY): Payer: BC Managed Care – PPO

## 2022-07-20 ENCOUNTER — Other Ambulatory Visit: Payer: Self-pay | Admitting: Interventional Radiology

## 2022-07-20 ENCOUNTER — Encounter (HOSPITAL_COMMUNITY): Payer: Self-pay | Admitting: Internal Medicine

## 2022-07-20 DIAGNOSIS — K922 Gastrointestinal hemorrhage, unspecified: Secondary | ICD-10-CM | POA: Diagnosis not present

## 2022-07-20 LAB — BASIC METABOLIC PANEL
Anion gap: 7 (ref 5–15)
BUN: 11 mg/dL (ref 6–20)
CO2: 22 mmol/L (ref 22–32)
Calcium: 8.2 mg/dL — ABNORMAL LOW (ref 8.9–10.3)
Chloride: 109 mmol/L (ref 98–111)
Creatinine, Ser: 0.93 mg/dL (ref 0.61–1.24)
GFR, Estimated: >60 mL/min (ref >60)
Glucose, Bld: 112 mg/dL — ABNORMAL HIGH (ref 70–99)
Potassium: 4 mmol/L (ref 3.5–5.1)
Sodium: 138 mmol/L (ref 135–145)

## 2022-07-20 LAB — CBC
HCT: 25.8 % — ABNORMAL LOW (ref 39.0–52.0)
HCT: 28.5 % — ABNORMAL LOW (ref 39.0–52.0)
Hemoglobin: 8.2 g/dL — ABNORMAL LOW (ref 13.0–17.0)
Hemoglobin: 8.9 g/dL — ABNORMAL LOW (ref 13.0–17.0)
MCH: 27.1 pg (ref 26.0–34.0)
MCH: 27.5 pg (ref 26.0–34.0)
MCHC: 31.8 g/dL (ref 30.0–36.0)
MCHC: 31.2 g/dL (ref 30.0–36.0)
MCV: 85.1 fL (ref 80.0–100.0)
MCV: 88 fL (ref 80.0–100.0)
Platelets: 247 10*3/uL (ref 150–400)
Platelets: 321 10*3/uL (ref 150–400)
RBC: 3.03 MIL/uL — ABNORMAL LOW (ref 4.22–5.81)
RBC: 3.24 MIL/uL — ABNORMAL LOW (ref 4.22–5.81)
RDW: 15.1 % (ref 11.5–15.5)
RDW: 15.2 % (ref 11.5–15.5)
WBC: 8.1 10*3/uL (ref 4.0–10.5)
WBC: 16.7 10*3/uL — ABNORMAL HIGH (ref 4.0–10.5)
nRBC: 0 % (ref 0.0–0.2)
nRBC: 0 % (ref 0.0–0.2)

## 2022-07-20 LAB — HEMOGLOBIN AND HEMATOCRIT, BLOOD
HCT: 27 % — ABNORMAL LOW (ref 39.0–52.0)
HCT: 25.3 % — ABNORMAL LOW (ref 39.0–52.0)
Hemoglobin: 8.5 g/dL — ABNORMAL LOW (ref 13.0–17.0)
Hemoglobin: 7.8 g/dL — ABNORMAL LOW (ref 13.0–17.0)

## 2022-07-20 LAB — PROTIME-INR
INR: 1.1 (ref 0.8–1.2)
Prothrombin Time: 14.2 seconds (ref 11.4–15.2)

## 2022-07-20 LAB — BPAM RBC: Unit Type and Rh: 6200

## 2022-07-20 LAB — HEPATIC FUNCTION PANEL
ALT: 31 U/L (ref 0–44)
AST: 20 U/L (ref 15–41)
Albumin: 2.8 g/dL — ABNORMAL LOW (ref 3.5–5.0)
Alkaline Phosphatase: 52 U/L (ref 38–126)
Bilirubin, Direct: 0.1 mg/dL (ref 0.0–0.2)
Indirect Bilirubin: 0.7 mg/dL (ref 0.3–0.9)
Total Bilirubin: 0.8 mg/dL (ref 0.3–1.2)
Total Protein: 5.3 g/dL — ABNORMAL LOW (ref 6.5–8.1)

## 2022-07-20 LAB — MAGNESIUM: Magnesium: 2.1 mg/dL (ref 1.7–2.4)

## 2022-07-20 LAB — TYPE AND SCREEN
Antibody Screen: NEGATIVE
Unit division: 0

## 2022-07-20 LAB — AMMONIA: Ammonia: 36 umol/L — ABNORMAL HIGH (ref 9–35)

## 2022-07-20 LAB — PREPARE RBC (CROSSMATCH)

## 2022-07-20 LAB — BRAIN NATRIURETIC PEPTIDE: B Natriuretic Peptide: 13.8 pg/mL (ref 0.0–100.0)

## 2022-07-20 MED ORDER — CHLORHEXIDINE GLUCONATE 0.12 % MT SOLN
OROMUCOSAL | Status: AC
  Administered 2022-07-20: 15 mL via OROMUCOSAL
  Filled 2022-07-20: qty 15

## 2022-07-20 MED ORDER — SODIUM CHLORIDE 0.9 % IV SOLN: 10.0000 mL/h | Freq: Once | INTRAVENOUS | Status: DC

## 2022-07-20 MED ORDER — SODIUM CHLORIDE 0.9 % IV SOLN
2.0000 g | INTRAVENOUS | Status: AC
  Filled 2022-07-20: qty 20

## 2022-07-20 MED ORDER — ORAL CARE MOUTH RINSE: 15.0000 mL | Freq: Once | OROMUCOSAL | Status: AC

## 2022-07-20 MED ORDER — CHLORHEXIDINE GLUCONATE 0.12 % MT SOLN: 15.0000 mL | Freq: Once | OROMUCOSAL | Status: AC

## 2022-07-20 MED ORDER — LACTATED RINGERS IV SOLN: INTRAVENOUS | Status: DC
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
## 2022-07-20 NOTE — H&P (Signed)
Chief Complaint: Patient was seen in consultation today for Transsplenic portal vein recanalization and TIPS creation  Chief Complaint  Patient presents with   Shortness of Breath   Weakness   at the request of DR Suttle  Supervising Physician: Marliss Coots  Patient Status: [facility] - In-pt  History of Present Illness: Nathan Rowland is a 42 y.o. male   FULL Code Status per pt  Scheduled today for Transsplenic portal vein recanalization and Transjugular Intrahepatic Portal System Shunt placement in IR today- with anesthesia  Pt is aware of procedure an agreeable to proceed    Past Medical History:  Diagnosis Date   Allergy 11/29/1996   Finger fracture    Kidney calculus    Left knee pain    dx'd with L retropatellar knee pain    Past Surgical History:  Procedure Laterality Date   COLONOSCOPY WITH PROPOFOL N/A 07/14/2022   Procedure: COLONOSCOPY WITH PROPOFOL;  Surgeon: Sherrilyn Rist, MD;  Location: [facility] ENDOSCOPY;  Service: Gastroenterology;  Laterality: N/A;   ENTEROSCOPY N/A 07/12/2022   Procedure: ENTEROSCOPY;  Surgeon: Sherrilyn Rist, MD;  Location: [facility] ENDOSCOPY;  Service: Gastroenterology;  Laterality: N/A;   GIVENS CAPSULE STUDY N/A 07/12/2022   Procedure: GIVENS CAPSULE STUDY;  Surgeon: Sherrilyn Rist, MD;  Location: [facility] ENDOSCOPY;  Service: Gastroenterology;  Laterality: N/A;   IR RADIOLOGIST EVAL & MGMT  06/02/2022   SUBMUCOSAL TATTOO INJECTION  07/12/2022   Procedure: SUBMUCOSAL TATTOO INJECTION;  Surgeon: Sherrilyn Rist, MD;  Location: [facility] ENDOSCOPY;  Service: Gastroenterology;;    Allergies: Pollen extract  Medications: Prior to Admission medications   Medication Sig Start Date End Date Taking? Authorizing Provider  ELIQUIS 5 MG TABS tablet TAKE 1 TABLET BY MOUTH TWICE A DAY 07/12/22   Joaquim Nam, MD  EPINEPHrine 0.3 mg/0.3 mL IJ SOAJ injection Inject 0.3 mg into the muscle once as needed for anaphylaxis.    [provider]   Iron, Ferrous Sulfate, 325 (65 Fe) MG TABS Take 325 mg by mouth daily. Patient not taking: Reported on 07/11/2022 05/03/22   Joaquim Nam, MD     Family History  Problem Relation Age of Onset   Diabetes Other        3 great aunts, adult onset   Cancer Other        Uterine   Heart disease Maternal Grandmother        MI   Stroke Maternal Grandmother    Cancer Maternal Grandfather        Lung   Cancer Paternal Grandmother        Skin   Emphysema Paternal Grandfather    Hypertension Neg Hx    Depression Neg Hx    Alcohol abuse Neg Hx    Drug abuse Neg Hx    Prostate cancer Neg Hx    Colon cancer Neg Hx     Social History   Socioeconomic History   Marital status: Single    Spouse name: Not on file   Number of children: Not on file   Years of education: Not on file   Highest education level: 12th grade  Occupational History   Not on file  Tobacco Use   Smoking status: Never    Passive exposure: Past   Smokeless tobacco: Never  Substance and Sexual Activity   Alcohol use: Yes    Comment: occ   Drug use: No   Sexual activity: Not on file  Other Topics Concern   Not on file  Social History Narrative   Single, 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, sports   Social Determinants of Health   Financial Resource Strain: Patient Declined (05/25/2022)   Overall Financial Resource Strain (CARDIA)    Difficulty of Paying Living Expenses: Patient declined  Food Insecurity: No Food Insecurity (07/11/2022)   Hunger Vital Sign    Worried About Running Out of Food in the Last Year: Never true    Ran Out of Food in the Last Year: Never true  Transportation Needs: No Transportation Needs (07/11/2022)   PRAPARE - Administrator, Civil Service (Medical): No    Lack of Transportation (Non-Medical): No  Physical Activity: Unknown (05/25/2022)   Exercise Vital Sign    Days of Exercise per Week: Patient declined    Minutes of Exercise per Session: Not on file  Stress: Patient  Declined (05/25/2022)   [employer] of Occupational Health - Occupational Stress Questionnaire    Feeling of Stress : Patient declined  Social Connections: Unknown (05/25/2022)   Social Connection and Isolation Panel [NHANES]    Frequency of Communication with Friends and Family: Patient declined    Frequency of Social Gatherings with Friends and Family: Patient declined    Attends Religious Services: Patient declined    Database administrator or Organizations: Patient declined    Attends Engineer, structural: Not on file    Marital Status: Never married    Review of Systems: A 12 point ROS discussed and pertinent positives are indicated in the HPI above.  All other systems are negative.  Review of Systems  Constitutional:  Negative for activity change, fatigue and fever.  Respiratory:  Negative for cough and shortness of breath.   Cardiovascular:  Negative for chest pain.  Gastrointestinal:  Negative for abdominal pain.  Neurological:  Negative for weakness.  Psychiatric/Behavioral:  Negative for behavioral problems and confusion.     Vital Signs: BP 106/65 (BP Location: Left Arm)   Pulse 84   Temp 97.8 F (36.6 C) (Oral)   Resp 14   Ht 6' (1.829 m)   Wt 240 lb (108.9 kg)   SpO2 95%   BMI 32.55 kg/m     Physical Exam Vitals reviewed.  Cardiovascular:     Rate and Rhythm: Normal rate and regular rhythm.  Pulmonary:     Effort: Pulmonary effort is normal.     Breath sounds: Normal breath sounds.  Abdominal:     Palpations: Abdomen is soft.  Musculoskeletal:        General: Normal range of motion.  Skin:    General: Skin is warm.  Neurological:     Mental Status: He is alert and oriented to person, place, and time.  Psychiatric:        Behavior: Behavior normal.     Imaging: CT Angio Chest Pulmonary Embolism (PE) W or WO Contrast  Result Date: 07/12/2022 CLINICAL DATA:  Dyspnea, hypoxia. EXAM: CT ANGIOGRAPHY CHEST WITH CONTRAST TECHNIQUE:  Multidetector CT imaging of the chest was performed using the standard protocol during bolus administration of intravenous contrast. Multiplanar CT image reconstructions and MIPs were obtained to evaluate the vascular anatomy. RADIATION DOSE REDUCTION: This exam was performed according to the departmental dose-optimization program which includes automated exposure control, adjustment of the mA and/or kV according to patient size and/or use of iterative reconstruction technique. CONTRAST:  75mL OMNIPAQUE IOHEXOL 350 MG/ML SOLN COMPARISON:  CTA  GI bleed 07/12/2022.  Chest CT 02/20/2021. FINDINGS: Cardiovascular: Examination is limited secondary to timing of the contrast bolus and respiratory motion artifact. There several focal hypodense areas seen within segmental and subsegmental levels of the right upper lobe and right lower lobe which are indeterminate for small pulmonary emboli for example axial image 5/66. The main pulmonary artery is normal in size. Heart and aorta are normal in size. There is no pericardial effusion. Mediastinum/Nodes: There are prominent, but nonenlarged left hilar lymph nodes. Visualized esophagus and thyroid gland are within normal limits. Lungs/Pleura: There is new dense airspace consolidation throughout the entire left lower lobe and minimally in the inferior left upper lobe. There are additional patchy ground-glass opacities throughout the left upper lobe. There is a opacification of left lower lobe bronchi. The right lung is clear. There is no pleural effusion or pneumothorax. Upper Abdomen: Small amount of ascites in the upper abdomen appears unchanged. Musculoskeletal: No chest wall abnormality. No acute or significant osseous findings. Review of the MIP images confirms the above findings. IMPRESSION: 1. Examination is limited secondary to timing of the contrast bolus and respiratory motion artifact. There are several focal hypodense areas seen within segmental and subsegmental levels  of the right upper lobe and right lower lobe which are indeterminate for small pulmonary emboli. No evidence for right heart strain. 2. New dense airspace consolidation throughout the entire left lower lobe and minimally in the inferior left upper lobe. There is opacification of left lower lobe bronchi. Findings are concerning for pneumonia. Follow-up imaging recommended to confirm resolution. 3. Stable small amount of ascites in the upper abdomen. Electronically Signed   By: Darliss Cheney M.D.   On: 07/12/2022 18:02   DG CHEST PORT 1 VIEW  Result Date: 07/12/2022 CLINICAL DATA:  Dyspnea. EXAM: PORTABLE CHEST 1 VIEW COMPARISON:  AP chest 02/20/2021 and CT chest 02/20/2021 FINDINGS: Cardiac silhouette and mediastinal contours within normal limits. There is a new heterogeneous airspace opacification of the left mid and lower lung. The right lung appears clear. No definite pleural effusion. No pneumothorax. No acute skeletal abnormality. IMPRESSION: New heterogeneous airspace opacification of the left mid and lower lung, concerning for pneumonia. Note is made that on the CT abdomen and pelvis and frontal scout view of the CT approximately 7 hours earlier today at the left lower lung appears more clear. Question pneumonia versus recent aspiration. Electronically Signed   By: Neita Garnet M.D.   On: 07/12/2022 17:01   CT ANGIO GI BLEED  Result Date: 07/12/2022 CLINICAL DATA:  Evaluate for GI bleed.  Anemia. EXAM: CTA ABDOMEN AND PELVIS WITHOUT AND WITH CONTRAST TECHNIQUE: Multidetector CT imaging of the abdomen and pelvis was performed using the standard protocol during bolus administration of intravenous contrast. Multiplanar reconstructed images and MIPs were obtained and reviewed to evaluate the vascular anatomy. RADIATION DOSE REDUCTION: This exam was performed according to the departmental dose-optimization program which includes automated exposure control, adjustment of the mA and/or kV according to  patient size and/or use of iterative reconstruction technique. CONTRAST:  OMNIPAQUE IOHEXOL 350 MG/ML SOLN COMPARISON:  05/07/2018 for FINDINGS: VASCULAR Aorta: Normal caliber aorta without aneurysm, dissection, vasculitis or significant stenosis. Celiac: Patent without evidence of aneurysm, dissection, vasculitis or significant stenosis. SMA: Patent without evidence of aneurysm, dissection, vasculitis or significant stenosis. Renals: Both renal arteries are patent without evidence of aneurysm, dissection, vasculitis, fibromuscular dysplasia or significant stenosis. IMA: Patent without evidence of aneurysm, dissection, vasculitis or significant stenosis. Inflow: Patent without evidence  of aneurysm, dissection, vasculitis or significant stenosis. Proximal Outflow: Bilateral common femoral and visualized portions of the superficial and profunda femoral arteries are patent without evidence of aneurysm, dissection, vasculitis or significant stenosis. 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 Review of the MIP images confirms the above findings. NON-VASCULAR Lower chest: No pleural effusion or airspace consolidation. Platelike atelectasis identified within the right middle lobe, lingula and both lower lobes Hepatobiliary: Multiple too small to characterize low-density liver lesions are again seen and appears similar to previous exam. Gallbladder appears normal. No wall thickening, inflammation or biliary dilatation. Pancreas: Unremarkable. No pancreatic ductal dilatation or surrounding inflammatory changes. Spleen: Mild splenomegaly measuring 13.6 cm. Stable from previous exam. Adrenals/Urinary Tract: Normal appearance of the kidneys. No hydronephrosis or mass. Punctate stone within interpolar collecting system of the left kidney noted, image 110/11. Stomach/Bowel: No signs of intraluminal contrast extravasation within  the small or large bowel loops to suggest active GI bleed. Stomach appears normal. The appendix is visualized and is within normal limits. No bowel wall thickening, inflammation, or distension. Colonic diverticulosis noted. No signs of acute diverticulitis. Lymphatic: No enlarged abdominopelvic lymph nodes. Reproductive: Prostate is unremarkable. Other: New abdominal ascites identified with free fluid extending over the liver and spleen. No discrete fluid collections. No signs of hematoma. Musculoskeletal: No acute or significant osseous findings. IMPRESSION: 1. No signs of intraluminal contrast extravasation within the small or large bowel loops to suggest active GI bleed. 2. 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 New abdominal ascites. Findings may reflect progressive portal venous hypertension. 4. Splenomegaly.  Stable from previous exam 5. Nonobstructing left renal calculus. Electronically Signed   By: Signa Kell M.D.   On: 07/12/2022 09:44    Labs:  CBC: Recent Labs    07/18/22 1735 07/19/22 0543 07/19/22 1705 07/19/22 2024 07/20/22 0537  WBC 8.6 8.6 8.6  --  8.1  HGB 7.0* 7.2* 8.8* 8.9* 8.2*  HCT 22.1* 22.9* 27.5* 28.7* 25.8*  PLT 260 255 260  --  247    COAGS: Recent Labs    07/12/22 0835 07/20/22 0537  INR 1.2 1.1  APTT 27  --     BMP: Recent Labs    07/17/22 0607 07/18/22 0547 07/19/22 0543 07/20/22 0537  NA 138 137 137 138  K 3.7 3.9 4.4 4.0  CL 107 108 106 109  CO2 25 25 25 22   GLUCOSE 101* 104* 115* 112*  BUN 12 9 10 11   CALCIUM 8.2* 8.0* 8.2* 8.2*  CREATININE 0.80 0.82 0.93 0.93  GFRNONAA >60 >60 >60 >60    LIVER FUNCTION TESTS: Recent Labs    07/12/22 1703 07/13/22 0618 07/14/22 0735 07/20/22 0537  BILITOT 1.0 0.7 0.6 0.8  AST 20 17 17 20   ALT 17 17 17 31   ALKPHOS 40 36* 39 52  PROT 4.9* 4.3* 4.9* 5.3*  ALBUMIN 2.8* 2.3* 2.5* 2.8*    TUMOR MARKERS: No results for  input(s): "AFPTM", "CEA", "CA199", "CHROMGRNA" in the last 8760 hours.  Assessment and Plan:  Scheduled today for IR procedure with Dr Algis Downs Elby Showers and anesthesia Transsplenic portal vein recanalization and Transjugular Intrahepatic Portal System Shunt placement  Risks and benefits of TIPS, BRTO and/or additional variceal embolization were discussed with the patient and/or the patient's family including, but not limited to, infection, bleeding, damage to adjacent structures, worsening hepatic and/or cardiac function, worsening and/or the  development of altered mental status/encephalopathy, non-target embolization and death.   This interventional procedure involves the use of X-rays and because of the nature of the planned procedure, it is possible that we will have prolonged use of X-ray fluoroscopy.  Potential radiation risks to you include (but are not limited to) the following: - A slightly elevated risk for cancer  several years later in life. This risk is typically less than 0.5% percent. This risk is low in comparison to the normal incidence of human cancer, which is 33% for women and 50% for men according to the American Cancer Society. - Radiation induced injury can include skin redness, resembling a rash, tissue breakdown / ulcers and hair loss (which can be temporary or permanent).   The likelihood of either of these occurring depends on the difficulty of the procedure and whether you are sensitive to radiation due to previous procedures, disease, or genetic conditions.   IF your procedure requires a prolonged use of radiation, you will be notified and given written instructions for further action.  It is your responsibility to monitor the irradiated area for the 2 weeks following the procedure and to notify your physician if you are concerned that you have suffered a radiation induced injury.    All of the patient's questions were answered, patient is agreeable to proceed.  Consent  signed and in chart.   Thank you for this interesting consult.  I greatly enjoyed meeting EHTAN TRANUM and look forward to participating in their care.  A copy of this report was sent to the requesting provider on this date.  Electronically Signed: Robet Leu, PA-C 07/20/2022, 10:28 AM   I spent a total of 20 Minutes    in face to face in clinical consultation, greater than 50% of which was counseling/coordinating care for TIPs procedure

## 2022-07-20 NOTE — Progress Notes (Signed)
Patient scheduled for TIPS today but due to unforseen circumstances with another IR case the TIPS had to be cancelled.   Dr. Elby Showers discussed the situation with Dr. Adela Lank and Dr. Myrtie Neither and made the recommendation to not delay possible discharge if the patient becomes clinically stable. He proposed possible outpatient TIPS in the next week or two. Dr. Myrtie Neither and Dr. Adela Lank requested this procedure to be done while inpatient.   Anesthesia contacted with a potential time slot tomorrow. Our team will know in the morning if anesthesia can accommodate. Patient made NPO at midnight.   Dr. Elby Showers met with the patient and his parents at the bedside to discuss today's events and to discuss tentatively doing this procedure tomorrow. The patient is aware the procedure would be performed by Dr. Loreta Ave. The patient and his parents are also aware that anesthesia may note be able to accommodate and we would have to try for another day, possibly Friday with Dr. Archer Asa.   Dr. Thedore Mins with the hospitalist team made aware via Epic Chat.   IR will continue to follow.   Alwyn Ren, [place] [phone] 07/20/2022, 4:53 PM

## 2022-07-20 NOTE — Progress Notes (Signed)
[address] [place] came and talked to the pt that TIPS procedure is cancelled for today. A-line was d/c'd by CRNA. Report was given to [employer].  1600 Transferred pt back to 5W.

## 2022-07-21 ENCOUNTER — Encounter (HOSPITAL_COMMUNITY)
Admission: EM | Disposition: A | Discharge status: Home / Self Care | Payer: Self-pay | Source: Home / Self Care | Attending: Internal Medicine

## 2022-07-21 ENCOUNTER — Inpatient Hospital Stay (HOSPITAL_COMMUNITY): Payer: BC Managed Care – PPO

## 2022-07-21 ENCOUNTER — Inpatient Hospital Stay (HOSPITAL_COMMUNITY): Payer: BC Managed Care – PPO | Admitting: Anesthesiology

## 2022-07-21 ENCOUNTER — Encounter (HOSPITAL_COMMUNITY): Payer: Self-pay | Admitting: Internal Medicine

## 2022-07-21 DIAGNOSIS — I8510 Secondary esophageal varices without bleeding: Secondary | ICD-10-CM | POA: Diagnosis not present

## 2022-07-21 DIAGNOSIS — I81 Portal vein thrombosis: Secondary | ICD-10-CM | POA: Diagnosis not present

## 2022-07-21 DIAGNOSIS — K922 Gastrointestinal hemorrhage, unspecified: Secondary | ICD-10-CM | POA: Diagnosis not present

## 2022-07-21 DIAGNOSIS — D649 Anemia, unspecified: Secondary | ICD-10-CM | POA: Diagnosis not present

## 2022-07-21 DIAGNOSIS — I8500 Esophageal varices without bleeding: Secondary | ICD-10-CM | POA: Diagnosis not present

## 2022-07-21 DIAGNOSIS — K766 Portal hypertension: Secondary | ICD-10-CM | POA: Diagnosis not present

## 2022-07-21 HISTORY — PX: IR TIPS: IMG2295

## 2022-07-21 HISTORY — PX: IR US GUIDE VASC ACCESS RIGHT: IMG2390

## 2022-07-21 HISTORY — PX: IR US GUIDE VASC ACCESS LEFT: IMG2389

## 2022-07-21 HISTORY — PX: IR EMBO ART  VEN HEMORR LYMPH EXTRAV  INC GUIDE ROADMAPPING: IMG5450

## 2022-07-21 HISTORY — PX: RADIOLOGY WITH ANESTHESIA: SHX6223

## 2022-07-21 LAB — BASIC METABOLIC PANEL
Anion gap: 5 (ref 5–15)
BUN: 12 mg/dL (ref 6–20)
CO2: 26 mmol/L (ref 22–32)
Calcium: 8.4 mg/dL — ABNORMAL LOW (ref 8.9–10.3)
Chloride: 107 mmol/L (ref 98–111)
Creatinine, Ser: 0.88 mg/dL (ref 0.61–1.24)
GFR, Estimated: >60 mL/min (ref >60)
Glucose, Bld: 114 mg/dL — ABNORMAL HIGH (ref 70–99)
Potassium: 4.1 mmol/L (ref 3.5–5.1)
Sodium: 138 mmol/L (ref 135–145)

## 2022-07-21 LAB — PREPARE RBC (CROSSMATCH)

## 2022-07-21 LAB — CBC
HCT: 25.9 % — ABNORMAL LOW (ref 39.0–52.0)
HCT: 25.2 % — ABNORMAL LOW (ref 39.0–52.0)
Hemoglobin: 8.1 g/dL — ABNORMAL LOW (ref 13.0–17.0)
Hemoglobin: 7.9 g/dL — ABNORMAL LOW (ref 13.0–17.0)
MCH: 26.8 pg (ref 26.0–34.0)
MCH: 27 pg (ref 26.0–34.0)
MCHC: 31.3 g/dL (ref 30.0–36.0)
MCHC: 31.3 g/dL (ref 30.0–36.0)
MCV: 85.8 fL (ref 80.0–100.0)
MCV: 86 fL (ref 80.0–100.0)
Platelets: 275 10*3/uL (ref 150–400)
Platelets: 241 10*3/uL (ref 150–400)
RBC: 3.02 MIL/uL — ABNORMAL LOW (ref 4.22–5.81)
RBC: 2.93 MIL/uL — ABNORMAL LOW (ref 4.22–5.81)
RDW: 15.2 % (ref 11.5–15.5)
RDW: 15.4 % (ref 11.5–15.5)
WBC: 9.5 10*3/uL (ref 4.0–10.5)
WBC: 12.8 10*3/uL — ABNORMAL HIGH (ref 4.0–10.5)
nRBC: 0 % (ref 0.0–0.2)
nRBC: 0 % (ref 0.0–0.2)

## 2022-07-21 SURGERY — IR WITH ANESTHESIA
Anesthesia: Choice

## 2022-07-21 SURGERY — IR WITH ANESTHESIA
Anesthesia: General

## 2022-07-21 MED ORDER — IOHEXOL 300 MG/ML  SOLN
150.0000 mL | Freq: Once | INTRAMUSCULAR | Status: AC | PRN
  Administered 2022-07-21: 75 mL via INTRAVENOUS

## 2022-07-21 MED ORDER — ALBUMIN HUMAN 5 % IV SOLN: INTRAVENOUS | Status: DC | PRN

## 2022-07-21 MED ORDER — LIDOCAINE 2% (20 MG/ML) 5 ML SYRINGE
INTRAMUSCULAR | Status: DC | PRN
  Administered 2022-07-21: 100 mg via INTRAVENOUS
  Administered 2022-07-21: 50 mg via INTRAVENOUS

## 2022-07-21 MED ORDER — ROCURONIUM BROMIDE 10 MG/ML (PF) SYRINGE
PREFILLED_SYRINGE | INTRAVENOUS | Status: DC | PRN
  Administered 2022-07-21: 70 mg via INTRAVENOUS
  Administered 2022-07-21 (×4): 20 mg via INTRAVENOUS
  Administered 2022-07-21: 30 mg via INTRAVENOUS

## 2022-07-21 MED ORDER — IOHEXOL 300 MG/ML  SOLN
150.0000 mL | Freq: Once | INTRAMUSCULAR | Status: AC | PRN
  Administered 2022-07-21: 70 mL via INTRAVENOUS

## 2022-07-21 MED ORDER — PROPOFOL 10 MG/ML IV BOLUS
INTRAVENOUS | Status: DC | PRN
  Administered 2022-07-21: 200 mg via INTRAVENOUS

## 2022-07-21 MED ORDER — FENTANYL CITRATE (PF) 250 MCG/5ML IJ SOLN
INTRAMUSCULAR | Status: DC | PRN
  Administered 2022-07-21: 25 ug via INTRAVENOUS
  Administered 2022-07-21 (×3): 50 ug via INTRAVENOUS
  Administered 2022-07-21: 25 ug via INTRAVENOUS

## 2022-07-21 MED ORDER — PHENYLEPHRINE 80 MCG/ML (10ML) SYRINGE FOR IV PUSH (FOR BLOOD PRESSURE SUPPORT)
PREFILLED_SYRINGE | INTRAVENOUS | Status: DC | PRN
  Administered 2022-07-21: 40 ug via INTRAVENOUS
  Administered 2022-07-21: 80 ug via INTRAVENOUS

## 2022-07-21 MED ORDER — HYDROMORPHONE HCL 1 MG/ML IJ SOLN
0.2500 mg | INTRAMUSCULAR | Status: DC | PRN
  Administered 2022-07-21 (×4): 0.5 mg via INTRAVENOUS

## 2022-07-21 MED ORDER — ALBUTEROL SULFATE (2.5 MG/3ML) 0.083% IN NEBU
INHALATION_SOLUTION | RESPIRATORY_TRACT | Status: AC
  Filled 2022-07-21: qty 3

## 2022-07-21 MED ORDER — MIDAZOLAM HCL 2 MG/2ML IJ SOLN
INTRAMUSCULAR | Status: DC | PRN
  Administered 2022-07-21: 2 mg via INTRAVENOUS

## 2022-07-21 MED ORDER — MIDAZOLAM HCL 2 MG/2ML IJ SOLN
INTRAMUSCULAR | Status: AC
  Filled 2022-07-21: qty 2

## 2022-07-21 MED ORDER — LACTATED RINGERS IV SOLN: INTRAVENOUS | Status: DC

## 2022-07-21 MED ORDER — DEXTROSE 5 % IV SOLN
INTRAVENOUS | Status: DC | PRN
  Administered 2022-07-21: 2 g via INTRAVENOUS

## 2022-07-21 MED ORDER — IOHEXOL 300 MG/ML  SOLN
150.0000 mL | Freq: Once | INTRAMUSCULAR | Status: AC | PRN
  Administered 2022-07-21: 55 mL via INTRAVENOUS

## 2022-07-21 MED ORDER — SODIUM CHLORIDE 0.9 % IV SOLN: 10.0000 mL/h | Freq: Once | INTRAVENOUS | Status: DC

## 2022-07-21 MED ORDER — HEPARIN SODIUM (PORCINE) 1000 UNIT/ML IJ SOLN
INTRAMUSCULAR | Status: DC | PRN
  Administered 2022-07-21: 3000 [IU] via INTRAVENOUS
  Administered 2022-07-21: 2000 [IU] via INTRAVENOUS

## 2022-07-21 MED ORDER — PHENYLEPHRINE HCL-NACL 20-0.9 MG/250ML-% IV SOLN
INTRAVENOUS | Status: DC | PRN
  Administered 2022-07-21: 40 ug/min via INTRAVENOUS

## 2022-07-21 MED ORDER — MIDODRINE HCL 5 MG PO TABS
10.0000 mg | ORAL_TABLET | Freq: Three times a day (TID) | ORAL | Status: DC
  Administered 2022-07-21 – 2022-07-25 (×11): 10 mg via ORAL
  Filled 2022-07-21 (×11): qty 2

## 2022-07-21 MED ORDER — HYDROMORPHONE HCL 1 MG/ML IJ SOLN
INTRAMUSCULAR | Status: AC
  Filled 2022-07-21: qty 2

## 2022-07-21 MED ORDER — HYDROMORPHONE HCL 1 MG/ML IJ SOLN
INTRAMUSCULAR | Status: AC
  Filled 2022-07-21: qty 1

## 2022-07-21 MED ORDER — ALBUTEROL SULFATE (2.5 MG/3ML) 0.083% IN NEBU
2.5000 mg | INHALATION_SOLUTION | Freq: Once | RESPIRATORY_TRACT | Status: AC
  Administered 2022-07-21: 2.5 mg via RESPIRATORY_TRACT

## 2022-07-21 MED ORDER — SUGAMMADEX SODIUM 200 MG/2ML IV SOLN
INTRAVENOUS | Status: DC | PRN
  Administered 2022-07-21: 200 mg via INTRAVENOUS

## 2022-07-21 MED ORDER — ORAL CARE MOUTH RINSE: 15.0000 mL | OROMUCOSAL | Status: DC | PRN

## 2022-07-21 MED ORDER — DEXAMETHASONE SODIUM PHOSPHATE 10 MG/ML IJ SOLN
INTRAMUSCULAR | Status: DC | PRN
  Administered 2022-07-21: 5 mg via INTRAVENOUS

## 2022-07-21 MED ORDER — FENTANYL CITRATE (PF) 100 MCG/2ML IJ SOLN
INTRAMUSCULAR | Status: AC
  Filled 2022-07-21: qty 2

## 2022-07-21 MED ORDER — CHLORHEXIDINE GLUCONATE 0.12 % MT SOLN
15.0000 mL | Freq: Once | OROMUCOSAL | Status: AC
  Administered 2022-07-21: 15 mL via OROMUCOSAL

## 2022-07-21 MED ORDER — HYDROMORPHONE HCL 1 MG/ML IJ SOLN
0.5000 mg | INTRAMUSCULAR | Status: AC | PRN
  Administered 2022-07-21 (×2): 0.5 mg via INTRAVENOUS

## 2022-07-21 MED ORDER — ORAL CARE MOUTH RINSE: 15.0000 mL | Freq: Once | OROMUCOSAL | Status: AC

## 2022-07-21 MED ORDER — LIDOCAINE HCL 1 % IJ SOLN
INTRAMUSCULAR | Status: AC
  Filled 2022-07-21: qty 20

## 2022-07-21 MED ORDER — ONDANSETRON HCL 4 MG/2ML IJ SOLN
INTRAMUSCULAR | Status: DC | PRN
  Administered 2022-07-21: 4 mg via INTRAVENOUS

## 2022-07-21 MED ORDER — FUROSEMIDE 10 MG/ML IJ SOLN
20.0000 mg | Freq: Once | INTRAMUSCULAR | Status: AC
  Administered 2022-07-21: 20 mg via INTRAVENOUS
  Filled 2022-07-21: qty 2

## 2022-07-21 MED ORDER — SODIUM CHLORIDE 0.9 % IV SOLN
INTRAVENOUS | Status: AC
  Filled 2022-07-21: qty 20

## 2022-07-21 MED ORDER — HYDROCODONE-ACETAMINOPHEN 5-325 MG PO TABS
1.0000 | ORAL_TABLET | ORAL | Status: DC | PRN
  Administered 2022-07-21 – 2022-07-22 (×3): 1 via ORAL
  Filled 2022-07-21 (×4): qty 1

## 2022-07-21 MED ORDER — GELATIN ABSORBABLE 12-7 MM EX MISC
CUTANEOUS | Status: AC
  Filled 2022-07-21: qty 1

## 2022-07-21 MED ORDER — EPHEDRINE SULFATE-NACL 50-0.9 MG/10ML-% IV SOSY
PREFILLED_SYRINGE | INTRAVENOUS | Status: DC | PRN
  Administered 2022-07-21: 2.5 mg via INTRAVENOUS
  Administered 2022-07-21: 5 mg via INTRAVENOUS

## 2022-07-21 MED ORDER — SODIUM CHLORIDE 0.9 % IV SOLN: 2.0000 g | INTRAVENOUS | Status: AC

## 2022-07-21 MED ORDER — LACTATED RINGERS IV SOLN: INTRAVENOUS | Status: DC | PRN
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
## 2022-07-21 NOTE — Anesthesia Postprocedure Evaluation (Signed)
Anesthesia Post Note  Patient: Nathan [facility]  Procedure(s) Performed: TIPS     Patient location during evaluation: PACU Anesthesia Type: General Level of consciousness: sedated Pain management: pain level controlled Vital Signs Assessment: post-procedure vital signs reviewed and stable Respiratory status: spontaneous breathing and respiratory function stable Cardiovascular status: stable Postop Assessment: no apparent nausea or vomiting Anesthetic complications: no   No notable events documented.  Last Vitals:  Vitals:   07/21/22 1715 07/21/22 1730  BP: 119/68 (!) 199/69  Pulse: (!) 106 (!) 104  Resp: 16 15  Temp:  36.7 C  SpO2: (!) 87% 90%    Last Pain:  Vitals:   07/21/22 1730  TempSrc:   PainSc: 6                  Ulyess Muto DANIEL

## 2022-07-21 NOTE — Transfer of Care (Signed)
Immediate Anesthesia Transfer of Care Note  Patient: Nathan [facility]  Procedure(s) Performed: TIPS  Patient Location: PACU  Anesthesia Type:General  Level of Consciousness: awake and drowsy  Airway & Oxygen Therapy: Patient Spontanous Breathing and Patient connected to face mask oxygen  Post-op Assessment: Report given to RN and Post -op Vital signs reviewed and stable  Post vital signs: Reviewed and stable  Last Vitals:  Vitals Value Taken Time  BP 122/67 07/21/22 1604  Temp    Pulse 101 07/21/22 1608  Resp 24 07/21/22 1608  SpO2 95 % 07/21/22 1608  Vitals shown include unvalidated device data.  Last Pain:  Vitals:   07/21/22 1004  TempSrc: Oral  PainSc:          Complications: No notable events documented.

## 2022-07-21 NOTE — Anesthesia Procedure Notes (Signed)
Procedure Name: Intubation Date/Time: 07/21/2022 10:44 AM  Performed by: Lelon Perla, CRNAPre-anesthesia Checklist: Patient identified, Emergency Drugs available, Suction available and Patient being monitored Patient Re-evaluated:Patient Re-evaluated prior to induction Oxygen Delivery Method: Circle System Utilized Preoxygenation: Pre-oxygenation with 100% oxygen Induction Type: IV induction Ventilation: Mask ventilation without difficulty Laryngoscope Size: Mac and 4 Grade View: Grade II Tube type: Oral Tube size: 7.5 mm Number of attempts: 1 Airway Equipment and Method: Stylet Placement Confirmation: ETT inserted through vocal cords under direct vision, positive ETCO2 and breath sounds checked- equal and bilateral Secured at: 23 cm Tube secured with: Tape Dental Injury: Teeth and Oropharynx as per pre-operative assessment  Comments: Placed by C. Dan Humphreys, SRNA

## 2022-07-21 NOTE — Progress Notes (Signed)
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   Subjective:  FULL Code Status per pt   Scheduled today for Transsplenic portal vein recanalization and Transjugular Intrahepatic Portal System Shunt placement in IR today- with anesthesia   Pt is aware of procedure an agreeable to proceed  Pt was scheduled for this same procedure in IR yesterday  Unfortunately- had to cancel secondary IR emergent case and schedule issues Planned for procedure today - waiting to hear from anesthesia with possible availability  Pt is aware and agreeable  Allergies: Pollen extract  Medications: Prior to Admission medications   Medication Sig Start Date End Date Taking? Authorizing Provider  ELIQUIS 5 MG TABS tablet TAKE 1 TABLET BY MOUTH TWICE A DAY 07/12/22   Joaquim Nam, MD  EPINEPHrine 0.3 mg/0.3 mL IJ SOAJ injection Inject 0.3 mg into the muscle once as needed for anaphylaxis.    [provider]  Iron, Ferrous Sulfate, 325 (65 Fe) MG TABS Take 325 mg by mouth daily. Patient not taking: Reported on 07/11/2022 05/03/22   Joaquim Nam, MD     Vital Signs: BP (!) 106/59 (BP Location: Left Arm)   Pulse 75   Temp 97.8 F (36.6 C) (Oral)   Resp 13   Ht 6' (1.829 m)   Wt 240 lb (108.9 kg)   SpO2 97%   BMI 32.55 kg/m   Physical Exam Vitals reviewed.  HENT:     Mouth/Throat:     Mouth: Mucous membranes are moist.  Cardiovascular:     Rate and Rhythm: Normal rate and regular rhythm.  Pulmonary:     Effort: Pulmonary effort is normal.     Breath sounds: Normal breath sounds.  Musculoskeletal:        General: Normal range of motion.  Skin:    General: Skin is warm.   Neurological:     Mental Status: He is alert and oriented to person, place, and time.  Psychiatric:        Behavior: Behavior normal.     Imaging: No results found.  Labs:  CBC: Recent Labs    07/19/22 0543 07/19/22 1705 07/19/22 2024 07/20/22 0537 07/20/22 0930 07/20/22 1727 07/20/22 2119  WBC 8.6 8.6  --  8.1  --  16.7*  --   HGB 7.2* 8.8*   < > 8.2* 8.5* 8.9* 7.8*  HCT 22.9* 27.5*   < > 25.8* 27.0* 28.5* 25.3*  PLT 255 260  --  247  --  321  --    < > = values in this interval not displayed.    COAGS: Recent Labs    07/12/22 0835 07/20/22 0537  INR 1.2 1.1  APTT 27  --     BMP: Recent Labs    07/17/22 0607 07/18/22 0547 07/19/22 0543 07/20/22 0537  NA 138 137 137 138  K 3.7 3.9 4.4 4.0  CL 107 108 106 109  CO2 25 25 25 22   GLUCOSE 101* 104* 115* 112*  BUN 12 9 10 11   CALCIUM 8.2* 8.0* 8.2* 8.2*  CREATININE 0.80 0.82 0.93 0.93  GFRNONAA >60 >60 >60 >60    LIVER FUNCTION TESTS: Recent Labs    07/12/22 1703 07/13/22 0618 07/14/22 0735 07/20/22 0537  BILITOT 1.0 0.7  0.6 0.8  AST 20 17 17 20   ALT 17 17 17 31   ALKPHOS 40 36* 39 52  PROT 4.9* 4.3* 4.9* 5.3*  ALBUMIN 2.8* 2.3* 2.5* 2.8*    Assessment and Plan:  Scheduled today for IR procedure with Dr Mosie Epstein and anesthesia Transsplenic portal vein recanalization and Transjugular Intrahepatic Portal System Shunt placement  Risks and benefits of TIPS, BRTO and/or additional variceal embolization were discussed with the patient and/or the patient's family including, but not limited to, infection, bleeding, damage to adjacent structures, worsening hepatic and/or cardiac function, worsening and/or the development of altered mental status/encephalopathy, non-target embolization and death.    This interventional procedure involves the use of X-rays and because of the nature of the planned procedure, it is possible that we will have prolonged use of X-ray fluoroscopy.   Potential radiation risks  to you include (but are not limited to) the following: - A slightly elevated risk for cancer             several years later in life. This risk is typically less than 0.5% percent. This risk is low in comparison to the normal incidence of human cancer, which is 33% for women and 50% for men according to the [employer]. - Radiation induced injury can include skin redness, resembling a rash, tissue breakdown / ulcers and hair loss (which can be temporary or permanent).    The likelihood of either of these occurring depends on the difficulty of the procedure and whether you are sensitive to radiation due to previous procedures, disease, or genetic conditions.    IF your procedure requires a prolonged use of radiation, you will be notified and given written instructions for further action.  It is your responsibility to monitor the irradiated area for the 2 weeks following the procedure and to notify your physician if you are concerned that you have suffered a radiation induced injury.     All of the patient's questions were answered, patient is agreeable to proceed.   Consent signed and in chart.     Thank you for this interesting consult.  I greatly enjoyed meeting JASIRI PUYEAR and look forward to participating in their care.  A copy of this report was sent to the requesting provider on this date.  Electronically Signed: Robet Leu, PA-C 07/21/2022, 7:02 AM   I spent a total of 15 Minutes at the the patient's bedside AND on the patient's [facility] floor or unit, greater than 50% of which was counseling/coordinating care for TIPS procedure

## 2022-07-21 NOTE — Procedures (Signed)
Interventional Radiology Procedure Note  Procedure:   US guided IJ vein access. US guided transplenic splenic/portal vein access US guided transhepatic portal vein access Recan of occluded portal vein with PTA. Recan of occluded IMV with PTA TIPS Embolization of multiple competing varices to improve portal flow.  Second physician was present due to complexity of the case.  Dr. Katherina Right  Complications: None Anesthesia: GETA  Recommendations:  - Stable to PACU - med surge floor for recovery - routine wound care right ij, RUQ access, LUQ access - VIR to follow - Follow up in clinic after DC with Dr. Loreta Ave or Dr. Elby Showers  Signed,  Yvone Neu. Loreta Ave, DO

## 2022-07-21 NOTE — Anesthesia Procedure Notes (Signed)
Arterial Line Insertion Start/End5/22/2024 10:35 AM, 07/21/2022 10:45 AM Performed by: Lelon Perla, CRNA, CRNA  Patient location: Pre-op. Preanesthetic checklist: patient identified, IV checked, site marked, risks and benefits discussed, surgical consent, monitors and equipment checked, pre-op evaluation, timeout performed and anesthesia consent Lidocaine 1% used for infiltration Left, radial was placed Catheter size: 20 G Hand hygiene performed  and maximum sterile barriers used   Attempts: 1 Procedure performed without using ultrasound guided technique. Following insertion, dressing applied and Biopatch. Post procedure assessment: normal and unchanged  Patient tolerated the procedure well with no immediate complications.

## 2022-07-22 ENCOUNTER — Encounter (HOSPITAL_COMMUNITY): Payer: Self-pay | Admitting: Anesthesiology

## 2022-07-22 ENCOUNTER — Ambulatory Visit: Payer: BC Managed Care – PPO

## 2022-07-22 DIAGNOSIS — K922 Gastrointestinal hemorrhage, unspecified: Secondary | ICD-10-CM | POA: Diagnosis not present

## 2022-07-22 DIAGNOSIS — Z7901 Long term (current) use of anticoagulants: Secondary | ICD-10-CM | POA: Diagnosis not present

## 2022-07-22 DIAGNOSIS — K766 Portal hypertension: Secondary | ICD-10-CM | POA: Diagnosis not present

## 2022-07-22 DIAGNOSIS — I81 Portal vein thrombosis: Secondary | ICD-10-CM | POA: Diagnosis not present

## 2022-07-22 LAB — CBC
HCT: 23.8 % — ABNORMAL LOW (ref 39.0–52.0)
HCT: 24.2 % — ABNORMAL LOW (ref 39.0–52.0)
HCT: 23.5 % — ABNORMAL LOW (ref 39.0–52.0)
Hemoglobin: 7.2 g/dL — ABNORMAL LOW (ref 13.0–17.0)
Hemoglobin: 7.6 g/dL — ABNORMAL LOW (ref 13.0–17.0)
Hemoglobin: 7.3 g/dL — ABNORMAL LOW (ref 13.0–17.0)
MCH: 26.2 pg (ref 26.0–34.0)
MCH: 26.8 pg (ref 26.0–34.0)
MCH: 26.9 pg (ref 26.0–34.0)
MCHC: 30.3 g/dL (ref 30.0–36.0)
MCHC: 31.4 g/dL (ref 30.0–36.0)
MCHC: 31.1 g/dL (ref 30.0–36.0)
MCV: 86.5 fL (ref 80.0–100.0)
MCV: 85.2 fL (ref 80.0–100.0)
MCV: 86.7 fL (ref 80.0–100.0)
Platelets: 182 10*3/uL (ref 150–400)
Platelets: 204 10*3/uL (ref 150–400)
Platelets: 189 10*3/uL (ref 150–400)
RBC: 2.75 MIL/uL — ABNORMAL LOW (ref 4.22–5.81)
RBC: 2.84 MIL/uL — ABNORMAL LOW (ref 4.22–5.81)
RBC: 2.71 MIL/uL — ABNORMAL LOW (ref 4.22–5.81)
RDW: 15.5 % (ref 11.5–15.5)
RDW: 15.4 % (ref 11.5–15.5)
RDW: 15.7 % — ABNORMAL HIGH (ref 11.5–15.5)
WBC: 9.4 10*3/uL (ref 4.0–10.5)
WBC: 12.7 10*3/uL — ABNORMAL HIGH (ref 4.0–10.5)
WBC: 11.2 10*3/uL — ABNORMAL HIGH (ref 4.0–10.5)
nRBC: 0 % (ref 0.0–0.2)
nRBC: 0 % (ref 0.0–0.2)
nRBC: 0 % (ref 0.0–0.2)

## 2022-07-22 LAB — BRAIN NATRIURETIC PEPTIDE: B Natriuretic Peptide: 50.8 pg/mL (ref 0.0–100.0)

## 2022-07-22 LAB — BASIC METABOLIC PANEL
Anion gap: 10 (ref 5–15)
BUN: 11 mg/dL (ref 6–20)
CO2: 23 mmol/L (ref 22–32)
Calcium: 8.4 mg/dL — ABNORMAL LOW (ref 8.9–10.3)
Chloride: 105 mmol/L (ref 98–111)
Creatinine, Ser: 0.91 mg/dL (ref 0.61–1.24)
GFR, Estimated: >60 mL/min (ref >60)
Glucose, Bld: 126 mg/dL — ABNORMAL HIGH (ref 70–99)
Potassium: 4 mmol/L (ref 3.5–5.1)
Sodium: 138 mmol/L (ref 135–145)

## 2022-07-22 LAB — AMMONIA: Ammonia: 25 umol/L (ref 9–35)

## 2022-07-22 LAB — PROCALCITONIN: Procalcitonin: <0.1 ng/mL

## 2022-07-22 LAB — C-REACTIVE PROTEIN: CRP: 0.7 mg/dL (ref <1.0)

## 2022-07-22 MED ORDER — DIPHENHYDRAMINE HCL 25 MG PO CAPS: 25.0000 mg | ORAL_CAPSULE | Freq: Once | ORAL | Status: DC

## 2022-07-22 MED ORDER — LACTULOSE 10 GM/15ML PO SOLN
30.0000 g | Freq: Three times a day (TID) | ORAL | Status: AC
  Administered 2022-07-22 (×3): 30 g via ORAL
  Filled 2022-07-22 (×2): qty 60

## 2022-07-22 MED ORDER — LACTATED RINGERS IV BOLUS
500.0000 mL | Freq: Once | INTRAVENOUS | Status: AC
  Administered 2022-07-22: 500 mL via INTRAVENOUS

## 2022-07-22 MED ORDER — CHLORHEXIDINE GLUCONATE CLOTH 2 % EX PADS
6.0000 | MEDICATED_PAD | Freq: Every day | CUTANEOUS | Status: DC
  Administered 2022-07-22: 6 via TOPICAL

## 2022-07-22 MED ORDER — SODIUM CHLORIDE 0.9 % IV SOLN
200.0000 mg | Freq: Once | INTRAVENOUS | Status: DC
  Filled 2022-07-22: qty 10

## 2022-07-22 MED ORDER — POLYETHYLENE GLYCOL 3350 17 G PO PACK
17.0000 g | PACK | Freq: Two times a day (BID) | ORAL | Status: DC
  Administered 2022-07-22 (×2): 17 g via ORAL
  Filled 2022-07-22 (×3): qty 1

## 2022-07-22 MED ORDER — DOCUSATE SODIUM 100 MG PO CAPS
200.0000 mg | ORAL_CAPSULE | Freq: Two times a day (BID) | ORAL | Status: DC
  Administered 2022-07-22 (×2): 200 mg via ORAL
  Filled 2022-07-22 (×3): qty 2

## 2022-07-22 MED ORDER — ACETAMINOPHEN 325 MG PO TABS: 650.0000 mg | ORAL_TABLET | Freq: Once | ORAL | Status: DC

## 2022-07-22 MED ORDER — KETOROLAC TROMETHAMINE 15 MG/ML IJ SOLN
15.0000 mg | Freq: Once | INTRAMUSCULAR | Status: AC
  Administered 2022-07-22: 15 mg via INTRAVENOUS
  Filled 2022-07-22: qty 1
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
## 2022-07-22 NOTE — Progress Notes (Signed)
Interventional Radiology Brief Note:  Patient assessed again this afternoon.  He reports his pain is improving after medication and ice pack.  Did get one dose of lactulose today which will also help with constipation.   Discussed follow-up plans.  Schedulers will contact him with date and time of appointment after discharge.   IR will continue to follow while inpatient.   Loyce Dys, MS RD PA-C

## 2022-07-22 NOTE — Addendum Note (Signed)
Addendum  created 07/22/22 0653 by Marcene Duos, MD   Attestation recorded in Intraprocedure, Intraprocedure Attestations filed

## 2022-07-22 NOTE — Progress Notes (Signed)
Interventional Radiology Progress Note   42 yo male POD 1 SP image guided TIPS with US guided trans-splenic and US guided trans-hepatic portal vein access, and coil embolization of varices.   Patient has main complaint today of both constipation, as well as right lower chest/right upper abdominal pain.   He says he last moved bowels yesterday before procedure.  Feels urge but no result.  He complains of focal pain at the access site of the trans-hepatic access.    He has normal VS's last 12 hours.   Labs: His labs show no significant change in H/H, with reverse trend of hgb 7.2 <--7.9 <--8.1--<7.8<--8.9 Ammonia is normal BNP is WNL Normal inflammatory markers  On exam, he is focally tender with reproduction of local pain just at the RUQ bandage. Bandage is C/D/I. He has no severe pain on inspiration.  Abd benign. No pain on the left access site bandage.  LUQ bandage is C/D/I RIJ bandage is C/D/I.  I suspect he has some MSK type pain at the access site, given the focality and reproducible pain.  We will give him single toradol dose, and ice prn  VIR to follow   Signed,  Yvone Neu. Loreta Ave, DO

## 2022-07-22 NOTE — Progress Notes (Signed)
      Progress Note   Subjective  Patient is s/p TIPS per IR yesterday. He states he woke up with some discomfort in his RUQ and that has persisted overnight, rated 7/10. Having a hard time taking a deep breath.    Objective   Vital signs in last 24 hours: Temp:  [97.8 F (36.6 C)-98.2 F (36.8 C)] 98.1 F (36.7 C) (05/23 0400) Pulse Rate:  [65-111] 65 (05/23 0400) Resp:  [10-23] 10 (05/23 0400) BP: (99-199)/(63-78) 126/71 (05/23 0506) SpO2:  [87 %-95 %] 95 % (05/23 0506) Arterial Line BP: (94-107)/(62-73) 94/62 (05/22 1715) Weight:  [108.9 kg] 108.9 kg (05/22 1002) Last BM Date : 07/21/22 General:    white male Abdomen:  mildly distended with tenderness in the right abdomen.  Neurologic:  Alert and oriented,  grossly normal neurologically. Psych:  Cooperative. Normal mood and affect.  Intake/Output from previous day: 05/22 0701 - 05/23 0700 In: 2830.1 [I.V.:2330.1; IV Piggyback:500] Out: 3125 [Urine:3075; Blood:50] Intake/Output this shift: No intake/output data recorded.  Lab Results: Recent Labs    07/21/22 0651 07/21/22 1755 07/22/22 0542  WBC 9.5 12.8* 9.4  HGB 8.1* 7.9* 7.2*  HCT 25.9* 25.2* 23.8*  PLT 275 241 182   BMET Recent Labs    07/20/22 0537 07/21/22 0651 07/22/22 0542  NA 138 138 138  K 4.0 4.1 4.0  CL 109 107 105  CO2 22 26 23   GLUCOSE 112* 114* 126*  BUN 11 12 11   CREATININE 0.93 0.88 0.91  CALCIUM 8.2* 8.4* 8.4*   LFT Recent Labs    07/20/22 0537  PROT 5.3*  ALBUMIN 2.8*  AST 20  ALT 31  ALKPHOS 52  BILITOT 0.8  BILIDIR 0.1  IBILI 0.7   PT/INR Recent Labs    07/20/22 0537  LABPROT 14.2  INR 1.1    Studies/Results: DG Chest Port 1 View  Result Date: 07/21/2022 CLINICAL DATA:  Shortness of breath EXAM: PORTABLE CHEST 1 VIEW COMPARISON:  07/12/2022 FINDINGS: Low lung volumes with subsegmental atelectasis at the bases. No consolidation or pleural effusion. Stable cardiomediastinal silhouette. No pneumothorax. Numerous  embolization coils in the left upper quadrant which are new compared to the prior exam. IMPRESSION: Low lung volumes with subsegmental atelectasis at the bases. Electronically Signed   By: Jasmine Pang M.D.   On: 07/21/2022 17:49       Assessment / Plan:    42 y/o male here with the following:  Noncirrhotic portal HTN with varices Recurrent GI bleeding Anemia  Patient is s/p TIPS and embolization of competing varices per extensive IR procedure yesterday in hopes of reducing his risk for recurrent bleeding. Labs stable this AM, no bleeding symptoms but he has had some pain post procedure which has persisted overnight. This could be reactive to TIPS, will let IR know, consider CT imaging.   Will recover over next few days from TIPS, monitor for recurrent bleeding. Hopefully this works well for him long term. We will coordinate outpatient follow up with Dr. Myrtie Neither.   We will follow peripherally for now, please contact us in the interim with any questions / concerns.  Harlin Rain, MD [facility] Gastroenter[facility]

## 2022-07-23 ENCOUNTER — Telehealth: Payer: Self-pay

## 2022-07-23 DIAGNOSIS — K922 Gastrointestinal hemorrhage, unspecified: Secondary | ICD-10-CM | POA: Diagnosis not present

## 2022-07-23 LAB — CBC
HCT: 23.8 % — ABNORMAL LOW (ref 39.0–52.0)
Hemoglobin: 7.5 g/dL — ABNORMAL LOW (ref 13.0–17.0)
MCH: 27.3 pg (ref 26.0–34.0)
MCHC: 31.5 g/dL (ref 30.0–36.0)
MCV: 86.5 fL (ref 80.0–100.0)
Platelets: 204 10*3/uL (ref 150–400)
RBC: 2.75 MIL/uL — ABNORMAL LOW (ref 4.22–5.81)
RDW: 15.6 % — ABNORMAL HIGH (ref 11.5–15.5)
WBC: 10.7 10*3/uL — ABNORMAL HIGH (ref 4.0–10.5)
nRBC: 0 % (ref 0.0–0.2)

## 2022-07-23 LAB — BASIC METABOLIC PANEL
Anion gap: 8 (ref 5–15)
BUN: 10 mg/dL (ref 6–20)
CO2: 24 mmol/L (ref 22–32)
Calcium: 8.4 mg/dL — ABNORMAL LOW (ref 8.9–10.3)
Chloride: 106 mmol/L (ref 98–111)
Creatinine, Ser: 0.8 mg/dL (ref 0.61–1.24)
GFR, Estimated: >60 mL/min (ref >60)
Glucose, Bld: 107 mg/dL — ABNORMAL HIGH (ref 70–99)
Potassium: 3.5 mmol/L (ref 3.5–5.1)
Sodium: 138 mmol/L (ref 135–145)

## 2022-07-23 LAB — MAGNESIUM: Magnesium: 1.9 mg/dL (ref 1.7–2.4)

## 2022-07-23 LAB — TYPE AND SCREEN
Unit division: 0
Unit division: 0

## 2022-07-23 LAB — AMMONIA: Ammonia: 36 umol/L — ABNORMAL HIGH (ref 9–35)

## 2022-07-23 LAB — BPAM RBC
Blood Product Expiration Date: 202406162359
Blood Product Expiration Date: 202406162359
Unit Type and Rh: 6200
Unit Type and Rh: 6200

## 2022-07-23 LAB — PROCALCITONIN: Procalcitonin: <0.1 ng/mL

## 2022-07-23 LAB — C-REACTIVE PROTEIN: CRP: 2.1 mg/dL — ABNORMAL HIGH (ref <1.0)

## 2022-07-23 LAB — BRAIN NATRIURETIC PEPTIDE: B Natriuretic Peptide: 34.6 pg/mL (ref 0.0–100.0)

## 2022-07-23 MED ORDER — MAGNESIUM HYDROXIDE 400 MG/5ML PO SUSP: 30.0000 mL | Freq: Two times a day (BID) | ORAL | Status: AC

## 2022-07-23 NOTE — Telephone Encounter (Signed)
No appts available within 2 months. Patient has been scheduled for a 58-month hospital f/u with Dr. Myrtie Neither on Wednesday, 10/27/22 at 10:20 am. Appt information mailed and sent to patient via MyChart.
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
## 2022-07-23 NOTE — Telephone Encounter (Signed)
-----   Message from Benancio Deeds, MD sent at 07/22/2022  5:47 PM EDT ----- Regarding: [facility] patient follow up This patient will need f/u with Dr. Myrtie Neither or APP in next 2 months. Thanks

## 2022-07-24 DIAGNOSIS — K922 Gastrointestinal hemorrhage, unspecified: Secondary | ICD-10-CM | POA: Diagnosis not present

## 2022-07-24 DIAGNOSIS — D649 Anemia, unspecified: Secondary | ICD-10-CM | POA: Diagnosis not present

## 2022-07-24 DIAGNOSIS — K769 Liver disease, unspecified: Secondary | ICD-10-CM | POA: Diagnosis not present

## 2022-07-24 LAB — PROCALCITONIN: Procalcitonin: <0.1 ng/mL

## 2022-07-24 LAB — PREPARE RBC (CROSSMATCH)

## 2022-07-24 LAB — AMMONIA: Ammonia: 38 umol/L — ABNORMAL HIGH (ref 9–35)

## 2022-07-24 LAB — CBC
HCT: 21.1 % — ABNORMAL LOW (ref 39.0–52.0)
HCT: 28.6 % — ABNORMAL LOW (ref 39.0–52.0)
Hemoglobin: 6.5 g/dL — CL (ref 13.0–17.0)
Hemoglobin: 8.9 g/dL — ABNORMAL LOW (ref 13.0–17.0)
MCH: 26 pg (ref 26.0–34.0)
MCH: 25.7 pg — ABNORMAL LOW (ref 26.0–34.0)
MCHC: 30.8 g/dL (ref 30.0–36.0)
MCHC: 31.1 g/dL (ref 30.0–36.0)
MCV: 84.4 fL (ref 80.0–100.0)
MCV: 82.7 fL (ref 80.0–100.0)
Platelets: 177 10*3/uL (ref 150–400)
Platelets: 198 10*3/uL (ref 150–400)
RBC: 2.5 MIL/uL — ABNORMAL LOW (ref 4.22–5.81)
RBC: 3.46 MIL/uL — ABNORMAL LOW (ref 4.22–5.81)
RDW: 15.3 % (ref 11.5–15.5)
RDW: 16.9 % — ABNORMAL HIGH (ref 11.5–15.5)
WBC: 10.4 10*3/uL (ref 4.0–10.5)
WBC: 10.4 10*3/uL (ref 4.0–10.5)
nRBC: 0 % (ref 0.0–0.2)
nRBC: 0 % (ref 0.0–0.2)

## 2022-07-24 LAB — C-REACTIVE PROTEIN: CRP: 3.8 mg/dL — ABNORMAL HIGH

## 2022-07-24 LAB — BASIC METABOLIC PANEL WITH GFR
Anion gap: 7 (ref 5–15)
BUN: 8 mg/dL (ref 6–20)
CO2: 22 mmol/L (ref 22–32)
Calcium: 8.2 mg/dL — ABNORMAL LOW (ref 8.9–10.3)
Chloride: 108 mmol/L (ref 98–111)
Creatinine, Ser: 0.78 mg/dL (ref 0.61–1.24)
GFR, Estimated: >60 mL/min
Glucose, Bld: 107 mg/dL — ABNORMAL HIGH (ref 70–99)
Potassium: 3.5 mmol/L (ref 3.5–5.1)
Sodium: 137 mmol/L (ref 135–145)

## 2022-07-24 LAB — TYPE AND SCREEN

## 2022-07-24 LAB — BPAM RBC
ISSUE DATE / TIME: 202405250925
Unit Type and Rh: 6200

## 2022-07-24 LAB — BRAIN NATRIURETIC PEPTIDE: B Natriuretic Peptide: 36.3 pg/mL (ref 0.0–100.0)

## 2022-07-24 MED ORDER — LACTULOSE 10 GM/15ML PO SOLN: 30.0000 g | Freq: Three times a day (TID) | ORAL | 3 refills | Status: DC

## 2022-07-24 MED ORDER — LACTULOSE 10 GM/15ML PO SOLN
30.0000 g | Freq: Three times a day (TID) | ORAL | Status: DC
  Administered 2022-07-24 – 2022-07-25 (×2): 30 g via ORAL
  Filled 2022-07-24 (×2): qty 60

## 2022-07-24 MED ORDER — DIPHENHYDRAMINE HCL 50 MG/ML IJ SOLN
25.0000 mg | INTRAMUSCULAR | Status: DC | PRN
  Administered 2022-07-24: 25 mg via INTRAVENOUS
  Filled 2022-07-24: qty 1

## 2022-07-24 MED ORDER — SODIUM CHLORIDE 0.9% IV SOLUTION: Freq: Once | INTRAVENOUS | Status: AC
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
## 2022-07-24 NOTE — Progress Notes (Cosign Needed Addendum)
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 Loreta Ave)  Subjective:  Patient seen at bedside, transfusion underway, parents at bedside. He reports feeling better than yesterday and is hopeful to go home tomorrow. Had lactulose 5/23 when constipated but states he hasn't received it since then - review of MAR shows it was ordered for 3 doses only on 5/23. He is having frequent BMs now after lactulose, miralax and milk of magnesia. Denies any hematochezia, melena, hematemesis, dizziness, lightheadedness, syncope. He continues to have RUQ pain which is improving and now only present with deep breaths and certain movements, ice helps, unsure if Toradol was helpful. His appetite is lower than baseline but continues to eat/drink regularly.  Allergies: Pollen extract  Medications: Prior to Admission medications   Medication Sig Start Date End Date Taking? Authorizing Provider  ELIQUIS 5 MG TABS tablet TAKE 1 TABLET BY MOUTH TWICE A DAY 07/12/22   Joaquim Nam, MD  EPINEPHrine 0.3 mg/0.3 mL IJ SOAJ injection Inject 0.3 mg into the muscle once as needed for anaphylaxis.    [provider]  Iron, Ferrous Sulfate, 325 (65 Fe) MG TABS Take 325 mg by mouth daily. Patient not taking: Reported on 07/11/2022 05/03/22   Joaquim Nam, MD     Vital Signs: BP 119/70   Pulse (!) 107   Temp 98.8 F (37.1 C) (Oral)   Resp 20   Ht 6' (1.829 m)   Wt 240 lb (108.9 kg)   SpO2 94%   BMI 32.55 kg/m   Physical Exam Vitals and nursing note reviewed.  Constitutional:      General: He is not in acute distress. HENT:     Head: Normocephalic.  Cardiovascular:     Rate and Rhythm: Tachycardia present.  Pulmonary:     Effort: Pulmonary effort is normal.  Abdominal:     General: There is no distension.     Palpations: Abdomen is soft.     Tenderness: There is abdominal  tenderness (RUQ).     Comments: (+) RUQ/right chest puncture site w/o dressing today, no active bleeding or drainage. Mild fullness/firmness when compared with LUQ - appears consistent with hematoma near intercostal space although no significant skin bruising noted. No abdominal pain in any quadrant  Skin:    General: Skin is warm and dry.  Neurological:     Mental Status: He is alert. Mental status is at baseline.     Imaging: IR Tips  Result Date: 07/22/2022 CLINICAL DATA:  42 year old male with a history of chronic portal vein thrombosis and gastrointestinal bleeding, presents for portal vein recanalization, possible tips and possible embolization EXAM: ULTRASOUND-GUIDED ACCESS RIGHT INTERNAL JUGULAR VEIN ULTRASOUND-GUIDED TRANS SPLENIC ACCESS INTO THE PORTAL VEIN ULTRASOUND-GUIDED TRANSHEPATIC ACCESS INTO THE PORTAL VENOUS SYSTEM BALLOON ANGIOPLASTY AND RECANALIZATION OF OCCLUDED PORTAL VEIN TRANSJUGULAR INTRAHEPATIC PORTOSYSTEMIC SHUNT COIL EMBOLIZATION OF MULTIPLE COMPETING VARICES MEDICATIONS: As antibiotic prophylaxis, Rocephin 1 gm IV was ordered pre-procedure and administered intravenously within one hour of incision. ANESTHESIA/SEDATION: General - as administered by the Anesthesia department Total intra-service moderate Sedation Time: 0 minutes. The patient's level of consciousness and vital signs were monitored continuously by radiology nursing throughout the procedure under my direct supervision. CONTRAST:  200 cc FLUOROSCOPY: Radiation Exposure Index (as provided by the fluoroscopic device): 4,096 mGy Kerma COMPLICATIONS: None PROCEDURE: Secondary to complexity of the case a second physician was required, with  assistance from Dr Katherina Right, VIR. Informed written consent was obtained from the patient and the patient's family after a thorough discussion of the procedural risks, benefits and alternatives. Specific risks discussed with TIPS/variceal embolization included: Bleeding, infection,  vascular injury, need for further procedure/surgery, renal injury/renal failure, contrast reaction, non-target embolization, liver dysfunction/failure, hepatic encephalopathy, stroke (~1%), cardiopulmonary collapse, death. All questions were addressed. Maximal Sterile Barrier Technique was utilized including caps, mask, sterile gowns, sterile gloves, sterile drape, hand hygiene and skin antiseptic. A timeout was performed prior to the initiation of the procedure. Patient was positioned supine position on the table, with the right upper quadrant, left upper quadrant, right inguinal region, and the right neck prepped and draped in the usual sterile fashion. Transjugular systemic venous access Ultrasound survey was then performed of the right neck, with images stored and sent to PACs, confirming patency of the right IJ. Ultrasound guidance was then used to access the right internal jugular vein with a micro puncture kit. The wire was advanced under fluoroscopy into the right atrium, and a small incision was made. The needle was removed and the dilator was placed. The micro wire in the stiffener were removed and an 035 wire was then passed into the inferior vena cava. Ten French soft tissue dilation was performed on the wire, and then 10 [place] TIPS sheath was placed. Trans splenic access Ultrasound survey of the left upper quadrant was performed, with images stored and sent to PACs. Using ultrasound guidance, an Accustick set was used to access a splenic vein in the hilum of the spleen. The needle tip was confirmed with contrast injection under fluoroscopy. The 018 Mandril wire was passed centrally within the splenic vein. An incision was made with 11 blade scalpel and the needle was removed from the wire. The Accustick set was then advanced through the splenic tissue into the splenic vein and the Mandril wire, stiffener, and the inner dilator were removed. Injection of contrast confirmed location in the splenic vein.  A standard 035 working wire was then placed through the 4 [place] sheath and the 4 [place] Accustick sheath was removed. A 25 cm 5 French Brite tip sheath was then placed through the spleen into the splenic vein. Multiple angiogram with varying obliquities were performed. After review of the images, we elected to attempt proceeding with antegrade recanalization of the occluded splenic vein/portal vein. A stiff Glidewire and an 035 Terumo Navicross catheter probe the occlusive region. After several attempts, we elected to withdraw from an antegrade attempt at this time and acquire transhepatic access. Trans hepatic access Ultrasound survey of the right upper quadrant was performed, with images stored and sent to PACs. Using ultrasound guidance, Chiba needle was used access the peripheral right portal system. Once we confirmed position in the portal system with portal venography, micro wire was advanced centrally. The 018 wire passed fairly easily through the right portal vein and main portal vein retrograde. The Accustick system advanced into the portal system, and then the the stiffener, wire, and dilator were removed. Venogram confirmed are location within the portal system. Stiff Glidewire was then advanced as far into the portal system as possible. This a then allowed replacement of the 4 [place] dilator with a 6 [place] standard bright tip sheath, 55 cm. Through the 6 [place] system we were able to cross the occluded portal vein to the site of inflow of developing caval transformation. We then used an angled 100 cm Davis catheter to find the occluded origin of  the splenic vein, and the David catheter and glidewire were passed retrograde into the splenic vein. Angiogram confirmed location. We then passed an Amplatz wire through the transhepatic access into the splenic vein, and with some luck the Amplatz wire rendezvous with the 5 French sheath from the trans splenic access. Balloon angioplasty was then performed  along the length of the occluded portal system with 6 mm balloon. Angiogram was performed. 8 mm x 80 mm balloon angioplasty was then performed along the length of the portal system. Angiogram was performed. We then elected to proceed with tips, with the strategy of an inflated balloon as the target in the portal system. 6 mm by 60 mm balloon was then inflated in the portal vein as a target. Transjugular intrahepatic portosystemic shunt Combination of Benson wire and multipurpose angled catheter were used to select the middle hepatic vein. Venogram confirmed location within the vein. Tip sheath was then advanced over the catheter with a coaxial Amplatz wire, for a position cephalad to the transition point of the wire in the portal venous system. Venogram of the hepatic venous system was performed both in a frontal projection and a right anterior oblique projection. The Colapinto needle was then advanced through the TIPS sheath housed in the Teflon sheath over the Amplatz wire. Using frontal and oblique projections, first attempt of portal access was performed. This resulted in prolapse of the system into the IVC and the needle was removed for further access into the selected hepatic vein. Bentson wire and the multipurpose angled catheter were again used to select the middle hepatic vein. Once the catheter was distal the Amplatz wire was advanced and then the sheath was further advanced into the selected middle hepatic vein. The protective Teflon sheath and the colapinto needle were again advanced on the Amplatz wire which was then removed. From this position approximately 5 passes were necessary to access the portal venous system, by way of targeting the balloon. The balloon was in fact not punctured and the needle entered the portal vein adjacent to the balloon. Access into the portal venous system was confirmed by advancing a Glidewire distally into the splenic vein. Needle and the Teflon protective sheath were  removed. A 100 cm 4 French glide cath was then advanced over the Glidewire into the portal system. The Glidewire was removed and Amplatz wire was then placed into the splenic vein to continue. A marking pigtail catheter was then advanced into the splenic vein. Angiogram was performed to confirm location. The position of the marking pigtail with then used to estimate the length of our tips stent, and we selected 10 mm-80 mm for placement. Marking pigtail catheter was then removed on an Amplatz wire. Balloon angioplasty of 6 mm x 40 mm was then performed along the tissue tract. Sheath was advanced on the balloon as a deflated and once the sheath was into the portal vein and angiogram was performed to confirm the tips stent target. Before placing the tips stent final balloon angioplasty was performed in the splenic vein with a 10 mm by 80 mm balloon. The tip stent was then deployed and post dilated with the 10 mm balloon. Balloon angioplasty was performed along the length of the previously occluded portal vein. Balloon was removed and angiogram was performed. Pigtail catheter was advanced into the splenic vein for repeat angiogram. After review of the images we elected to proceed with coil embolization of the visualized varices in order to direct flow through the portal venous system.  Embolization of varices The angled 035 catheter was placed into the splenic vein and a coaxial penumbra microcatheter was advanced. Combination of 014 fathom wire and the penumbra microcatheter were used to select the varices from the splenic vein. With safe position, coil embolization was performed with a series of penumbra Ruby coils. Repeat angiogram demonstrated some additional varices that we elected to coil embolize. The same microcatheter, microwire were used to select the varices. Coil embolization performed with additional series of penumbra Ruby coils. Repeat venogram demonstrated stasis of the embolized varices. Additional  varices were filling, however, were less apparent on the entrance flow. Given that there was significant antegrade flow through the tips stent we elected not to perform further coil embolization. We did, however, elect to attempt recanalization of the superior mesenteric vein to improve flow into the portal vein. Revascularization of inferior mesenteric vein Microcatheter was removed after the coil embolization. The angled 035 catheter was then used to probe for the superior mesenteric vein with the use of a standard Glidewire. While we could not identify the superior mesenteric vein inflow/occluded stump, the catheter did enter the inferior mesenteric vein which was occluded at the inflow to the portal system. Angiogram was performed confirming retrograde flow and we elected to balloon angioplasty the origin. 035 wire was then advanced into the IMV and the catheter was removed. 6 mm balloon angioplasty was then performed at the IMV confluence. Balloon was removed catheter was advanced into the IMV and angiogram was performed demonstrating antegrade flow into the portal system. After review of images and the case we elected to withdraw at this time. The transhepatic portal vein access was withdrawn and embolized with a combination of MRey coils and Gel-Foam slurry. The trans splenic portal vein access was withdrawn and embolized with a series of tornado and MRey coils. The transjugular access was removed with manual pressure for hemostasis. Patient remained hemodynamically stable throughout. No complications were encountered.  No significant blood loss. Benson wire was then passed through the Colapinto needle into the inferior mesenteric vein. Needle was removed and disposed. Pigtail marking catheter was then advanced over the wire for portal venogram confirming location. 6 mm balloon angioplasty was then performed of the soft tissue tract. Sheath was advanced over the deflating balloon into the portal venous  system. Multipurpose angled catheter was advanced into the distal splenic vein in the splenic hilum, for splenic venogram. IMPRESSION: Status post ultrasound-guided right IJ venous access, ultrasound-guided trans splenic portal vein access, ultrasound-guided transhepatic portal vein access for revascularization of occluded portal vein and IMV, TIPS, and coil embolization of competing varices, as treatment for repeat episodes of GI bleeding related to portal hypertension. Signed, Yvone Neu. Miachel Roux, RPVI [facility] Radiology Electronically Signed   By: Gilmer Mor D.O.   On: 07/22/2022 08:59   IR US Guide Vasc Access Right  Result Date: 07/22/2022 CLINICAL DATA:  42 year old male with a history of chronic portal vein thrombosis and gastrointestinal bleeding, presents for portal vein recanalization, possible tips and possible embolization EXAM: ULTRASOUND-GUIDED ACCESS RIGHT INTERNAL JUGULAR VEIN ULTRASOUND-GUIDED TRANS SPLENIC ACCESS INTO THE PORTAL VEIN ULTRASOUND-GUIDED TRANSHEPATIC ACCESS INTO THE PORTAL VENOUS SYSTEM BALLOON ANGIOPLASTY AND RECANALIZATION OF OCCLUDED PORTAL VEIN TRANSJUGULAR INTRAHEPATIC PORTOSYSTEMIC SHUNT COIL EMBOLIZATION OF MULTIPLE COMPETING VARICES MEDICATIONS: As antibiotic prophylaxis, Rocephin 1 gm IV was ordered pre-procedure and administered intravenously within one hour of incision. ANESTHESIA/SEDATION: General - as administered by the Anesthesia department Total intra-service moderate Sedation Time: 0  minutes. The patient's level of consciousness and vital signs were monitored continuously by radiology nursing throughout the procedure under my direct supervision. CONTRAST:  200 cc FLUOROSCOPY: Radiation Exposure Index (as provided by the fluoroscopic device): 4,096 mGy Kerma COMPLICATIONS: None PROCEDURE: Secondary to complexity of the case a second physician was required, with assistance from Dr Katherina Right, VIR. Informed  written consent was obtained from the patient and the patient's family after a thorough discussion of the procedural risks, benefits and alternatives. Specific risks discussed with TIPS/variceal embolization included: Bleeding, infection, vascular injury, need for further procedure/surgery, renal injury/renal failure, contrast reaction, non-target embolization, liver dysfunction/failure, hepatic encephalopathy, stroke (~1%), cardiopulmonary collapse, death. All questions were addressed. Maximal Sterile Barrier Technique was utilized including caps, mask, sterile gowns, sterile gloves, sterile drape, hand hygiene and skin antiseptic. A timeout was performed prior to the initiation of the procedure. Patient was positioned supine position on the table, with the right upper quadrant, left upper quadrant, right inguinal region, and the right neck prepped and draped in the usual sterile fashion. Transjugular systemic venous access Ultrasound survey was then performed of the right neck, with images stored and sent to PACs, confirming patency of the right IJ. Ultrasound guidance was then used to access the right internal jugular vein with a micro puncture kit. The wire was advanced under fluoroscopy into the right atrium, and a small incision was made. The needle was removed and the dilator was placed. The micro wire in the stiffener were removed and an 035 wire was then passed into the inferior vena cava. Ten French soft tissue dilation was performed on the wire, and then 10 [place] TIPS sheath was placed. Trans splenic access Ultrasound survey of the left upper quadrant was performed, with images stored and sent to PACs. Using ultrasound guidance, an Accustick set was used to access a splenic vein in the hilum of the spleen. The needle tip was confirmed with contrast injection under fluoroscopy. The 018 Mandril wire was passed centrally within the splenic vein. An incision was made with 11 blade scalpel and the needle was  removed from the wire. The Accustick set was then advanced through the splenic tissue into the splenic vein and the Mandril wire, stiffener, and the inner dilator were removed. Injection of contrast confirmed location in the splenic vein. A standard 035 working wire was then placed through the 4 [place] sheath and the 4 [place] Accustick sheath was removed. A 25 cm 5 French Brite tip sheath was then placed through the spleen into the splenic vein. Multiple angiogram with varying obliquities were performed. After review of the images, we elected to attempt proceeding with antegrade recanalization of the occluded splenic vein/portal vein. A stiff Glidewire and an 035 Terumo Navicross catheter probe the occlusive region. After several attempts, we elected to withdraw from an antegrade attempt at this time and acquire transhepatic access. Trans hepatic access Ultrasound survey of the right upper quadrant was performed, with images stored and sent to PACs. Using ultrasound guidance, Chiba needle was used access the peripheral right portal system. Once we confirmed position in the portal system with portal venography, micro wire was advanced centrally. The 018 wire passed fairly easily through the right portal vein and main portal vein retrograde. The Accustick system advanced into the portal system, and then the the stiffener, wire, and dilator were removed. Venogram confirmed are location within the portal system. Stiff Glidewire was then advanced as far into the portal system as possible. This a then allowed  replacement of the 4 [place] dilator with a 6 [place] standard bright tip sheath, 55 cm. Through the 6 [place] system we were able to cross the occluded portal vein to the site of inflow of developing caval transformation. We then used an angled 100 cm Davis catheter to find the occluded origin of the splenic vein, and the David catheter and glidewire were passed retrograde into the splenic vein. Angiogram confirmed  location. We then passed an Amplatz wire through the transhepatic access into the splenic vein, and with some luck the Amplatz wire rendezvous with the 5 French sheath from the trans splenic access. Balloon angioplasty was then performed along the length of the occluded portal system with 6 mm balloon. Angiogram was performed. 8 mm x 80 mm balloon angioplasty was then performed along the length of the portal system. Angiogram was performed. We then elected to proceed with tips, with the strategy of an inflated balloon as the target in the portal system. 6 mm by 60 mm balloon was then inflated in the portal vein as a target. Transjugular intrahepatic portosystemic shunt Combination of Benson wire and multipurpose angled catheter were used to select the middle hepatic vein. Venogram confirmed location within the vein. Tip sheath was then advanced over the catheter with a coaxial Amplatz wire, for a position cephalad to the transition point of the wire in the portal venous system. Venogram of the hepatic venous system was performed both in a frontal projection and a right anterior oblique projection. The Colapinto needle was then advanced through the TIPS sheath housed in the Teflon sheath over the Amplatz wire. Using frontal and oblique projections, first attempt of portal access was performed. This resulted in prolapse of the system into the IVC and the needle was removed for further access into the selected hepatic vein. Bentson wire and the multipurpose angled catheter were again used to select the middle hepatic vein. Once the catheter was distal the Amplatz wire was advanced and then the sheath was further advanced into the selected middle hepatic vein. The protective Teflon sheath and the colapinto needle were again advanced on the Amplatz wire which was then removed. From this position approximately 5 passes were necessary to access the portal venous system, by way of targeting the balloon. The balloon was in  fact not punctured and the needle entered the portal vein adjacent to the balloon. Access into the portal venous system was confirmed by advancing a Glidewire distally into the splenic vein. Needle and the Teflon protective sheath were removed. A 100 cm 4 French glide cath was then advanced over the Glidewire into the portal system. The Glidewire was removed and Amplatz wire was then placed into the splenic vein to continue. A marking pigtail catheter was then advanced into the splenic vein. Angiogram was performed to confirm location. The position of the marking pigtail with then used to estimate the length of our tips stent, and we selected 10 mm-80 mm for placement. Marking pigtail catheter was then removed on an Amplatz wire. Balloon angioplasty of 6 mm x 40 mm was then performed along the tissue tract. Sheath was advanced on the balloon as a deflated and once the sheath was into the portal vein and angiogram was performed to confirm the tips stent target. Before placing the tips stent final balloon angioplasty was performed in the splenic vein with a 10 mm by 80 mm balloon. The tip stent was then deployed and post dilated with the 10 mm balloon. Balloon angioplasty was  performed along the length of the previously occluded portal vein. Balloon was removed and angiogram was performed. Pigtail catheter was advanced into the splenic vein for repeat angiogram. After review of the images we elected to proceed with coil embolization of the visualized varices in order to direct flow through the portal venous system. Embolization of varices The angled 035 catheter was placed into the splenic vein and a coaxial penumbra microcatheter was advanced. Combination of 014 fathom wire and the penumbra microcatheter were used to select the varices from the splenic vein. With safe position, coil embolization was performed with a series of penumbra Ruby coils. Repeat angiogram demonstrated some additional varices that we elected  to coil embolize. The same microcatheter, microwire were used to select the varices. Coil embolization performed with additional series of penumbra Ruby coils. Repeat venogram demonstrated stasis of the embolized varices. Additional varices were filling, however, were less apparent on the entrance flow. Given that there was significant antegrade flow through the tips stent we elected not to perform further coil embolization. We did, however, elect to attempt recanalization of the superior mesenteric vein to improve flow into the portal vein. Revascularization of inferior mesenteric vein Microcatheter was removed after the coil embolization. The angled 035 catheter was then used to probe for the superior mesenteric vein with the use of a standard Glidewire. While we could not identify the superior mesenteric vein inflow/occluded stump, the catheter did enter the inferior mesenteric vein which was occluded at the inflow to the portal system. Angiogram was performed confirming retrograde flow and we elected to balloon angioplasty the origin. 035 wire was then advanced into the IMV and the catheter was removed. 6 mm balloon angioplasty was then performed at the IMV confluence. Balloon was removed catheter was advanced into the IMV and angiogram was performed demonstrating antegrade flow into the portal system. After review of images and the case we elected to withdraw at this time. The transhepatic portal vein access was withdrawn and embolized with a combination of MRey coils and Gel-Foam slurry. The trans splenic portal vein access was withdrawn and embolized with a series of tornado and MRey coils. The transjugular access was removed with manual pressure for hemostasis. Patient remained hemodynamically stable throughout. No complications were encountered.  No significant blood loss. Benson wire was then passed through the Colapinto needle into the inferior mesenteric vein. Needle was removed and disposed. Pigtail  marking catheter was then advanced over the wire for portal venogram confirming location. 6 mm balloon angioplasty was then performed of the soft tissue tract. Sheath was advanced over the deflating balloon into the portal venous system. Multipurpose angled catheter was advanced into the distal splenic vein in the splenic hilum, for splenic venogram. IMPRESSION: Status post ultrasound-guided right IJ venous access, ultrasound-guided trans splenic portal vein access, ultrasound-guided transhepatic portal vein access for revascularization of occluded portal vein and IMV, TIPS, and coil embolization of competing varices, as treatment for repeat episodes of GI bleeding related to portal hypertension. Signed, Yvone Neu. Miachel Roux, RPVI [facility] Electronically Signed   By: Gilmer Mor D.O.   On: 07/22/2022 08:59   IR US Guide Vasc Access Left  Result Date: 07/22/2022 CLINICAL DATA:  42 year old male with a history of chronic portal vein thrombosis and gastrointestinal bleeding, presents for portal vein recanalization, possible tips and possible embolization EXAM: ULTRASOUND-GUIDED ACCESS RIGHT INTERNAL JUGULAR VEIN ULTRASOUND-GUIDED TRANS SPLENIC ACCESS INTO THE PORTAL VEIN ULTRASOUND-GUIDED TRANSHEPATIC ACCESS INTO THE PORTAL  VENOUS SYSTEM BALLOON ANGIOPLASTY AND RECANALIZATION OF OCCLUDED PORTAL VEIN TRANSJUGULAR INTRAHEPATIC PORTOSYSTEMIC SHUNT COIL EMBOLIZATION OF MULTIPLE COMPETING VARICES MEDICATIONS: As antibiotic prophylaxis, Rocephin 1 gm IV was ordered pre-procedure and administered intravenously within one hour of incision. ANESTHESIA/SEDATION: General - as administered by the Anesthesia department Total intra-service moderate Sedation Time: 0 minutes. The patient's level of consciousness and vital signs were monitored continuously by radiology nursing throughout the procedure under my direct supervision. CONTRAST:  200 cc FLUOROSCOPY:  Radiation Exposure Index (as provided by the fluoroscopic device): 4,096 mGy Kerma COMPLICATIONS: None PROCEDURE: Secondary to complexity of the case a second physician was required, with assistance from Dr Katherina Right, VIR. Informed written consent was obtained from the patient and the patient's family after a thorough discussion of the procedural risks, benefits and alternatives. Specific risks discussed with TIPS/variceal embolization included: Bleeding, infection, vascular injury, need for further procedure/surgery, renal injury/renal failure, contrast reaction, non-target embolization, liver dysfunction/failure, hepatic encephalopathy, stroke (~1%), cardiopulmonary collapse, death. All questions were addressed. Maximal Sterile Barrier Technique was utilized including caps, mask, sterile gowns, sterile gloves, sterile drape, hand hygiene and skin antiseptic. A timeout was performed prior to the initiation of the procedure. Patient was positioned supine position on the table, with the right upper quadrant, left upper quadrant, right inguinal region, and the right neck prepped and draped in the usual sterile fashion. Transjugular systemic venous access Ultrasound survey was then performed of the right neck, with images stored and sent to PACs, confirming patency of the right IJ. Ultrasound guidance was then used to access the right internal jugular vein with a micro puncture kit. The wire was advanced under fluoroscopy into the right atrium, and a small incision was made. The needle was removed and the dilator was placed. The micro wire in the stiffener were removed and an 035 wire was then passed into the inferior vena cava. Ten French soft tissue dilation was performed on the wire, and then 10 [place] TIPS sheath was placed. Trans splenic access Ultrasound survey of the left upper quadrant was performed, with images stored and sent to PACs. Using ultrasound guidance, an Accustick set was used to access a splenic  vein in the hilum of the spleen. The needle tip was confirmed with contrast injection under fluoroscopy. The 018 Mandril wire was passed centrally within the splenic vein. An incision was made with 11 blade scalpel and the needle was removed from the wire. The Accustick set was then advanced through the splenic tissue into the splenic vein and the Mandril wire, stiffener, and the inner dilator were removed. Injection of contrast confirmed location in the splenic vein. A standard 035 working wire was then placed through the 4 [place] sheath and the 4 [place] Accustick sheath was removed. A 25 cm 5 French Brite tip sheath was then placed through the spleen into the splenic vein. Multiple angiogram with varying obliquities were performed. After review of the images, we elected to attempt proceeding with antegrade recanalization of the occluded splenic vein/portal vein. A stiff Glidewire and an 035 Terumo Navicross catheter probe the occlusive region. After several attempts, we elected to withdraw from an antegrade attempt at this time and acquire transhepatic access. Trans hepatic access Ultrasound survey of the right upper quadrant was performed, with images stored and sent to PACs. Using ultrasound guidance, Chiba needle was used access the peripheral right portal system. Once we confirmed position in the portal system with portal venography, micro wire was advanced centrally. The 018 wire passed fairly  easily through the right portal vein and main portal vein retrograde. The Accustick system advanced into the portal system, and then the the stiffener, wire, and dilator were removed. Venogram confirmed are location within the portal system. Stiff Glidewire was then advanced as far into the portal system as possible. This a then allowed replacement of the 4 [place] dilator with a 6 [place] standard bright tip sheath, 55 cm. Through the 6 [place] system we were able to cross the occluded portal vein to the site of inflow of  developing caval transformation. We then used an angled 100 cm Davis catheter to find the occluded origin of the splenic vein, and the David catheter and glidewire were passed retrograde into the splenic vein. Angiogram confirmed location. We then passed an Amplatz wire through the transhepatic access into the splenic vein, and with some luck the Amplatz wire rendezvous with the 5 French sheath from the trans splenic access. Balloon angioplasty was then performed along the length of the occluded portal system with 6 mm balloon. Angiogram was performed. 8 mm x 80 mm balloon angioplasty was then performed along the length of the portal system. Angiogram was performed. We then elected to proceed with tips, with the strategy of an inflated balloon as the target in the portal system. 6 mm by 60 mm balloon was then inflated in the portal vein as a target. Transjugular intrahepatic portosystemic shunt Combination of Benson wire and multipurpose angled catheter were used to select the middle hepatic vein. Venogram confirmed location within the vein. Tip sheath was then advanced over the catheter with a coaxial Amplatz wire, for a position cephalad to the transition point of the wire in the portal venous system. Venogram of the hepatic venous system was performed both in a frontal projection and a right anterior oblique projection. The Colapinto needle was then advanced through the TIPS sheath housed in the Teflon sheath over the Amplatz wire. Using frontal and oblique projections, first attempt of portal access was performed. This resulted in prolapse of the system into the IVC and the needle was removed for further access into the selected hepatic vein. Bentson wire and the multipurpose angled catheter were again used to select the middle hepatic vein. Once the catheter was distal the Amplatz wire was advanced and then the sheath was further advanced into the selected middle hepatic vein. The protective Teflon sheath and  the colapinto needle were again advanced on the Amplatz wire which was then removed. From this position approximately 5 passes were necessary to access the portal venous system, by way of targeting the balloon. The balloon was in fact not punctured and the needle entered the portal vein adjacent to the balloon. Access into the portal venous system was confirmed by advancing a Glidewire distally into the splenic vein. Needle and the Teflon protective sheath were removed. A 100 cm 4 French glide cath was then advanced over the Glidewire into the portal system. The Glidewire was removed and Amplatz wire was then placed into the splenic vein to continue. A marking pigtail catheter was then advanced into the splenic vein. Angiogram was performed to confirm location. The position of the marking pigtail with then used to estimate the length of our tips stent, and we selected 10 mm-80 mm for placement. Marking pigtail catheter was then removed on an Amplatz wire. Balloon angioplasty of 6 mm x 40 mm was then performed along the tissue tract. Sheath was advanced on the balloon as a deflated and once the sheath  was into the portal vein and angiogram was performed to confirm the tips stent target. Before placing the tips stent final balloon angioplasty was performed in the splenic vein with a 10 mm by 80 mm balloon. The tip stent was then deployed and post dilated with the 10 mm balloon. Balloon angioplasty was performed along the length of the previously occluded portal vein. Balloon was removed and angiogram was performed. Pigtail catheter was advanced into the splenic vein for repeat angiogram. After review of the images we elected to proceed with coil embolization of the visualized varices in order to direct flow through the portal venous system. Embolization of varices The angled 035 catheter was placed into the splenic vein and a coaxial penumbra microcatheter was advanced. Combination of 014 fathom wire and the penumbra  microcatheter were used to select the varices from the splenic vein. With safe position, coil embolization was performed with a series of penumbra Ruby coils. Repeat angiogram demonstrated some additional varices that we elected to coil embolize. The same microcatheter, microwire were used to select the varices. Coil embolization performed with additional series of penumbra Ruby coils. Repeat venogram demonstrated stasis of the embolized varices. Additional varices were filling, however, were less apparent on the entrance flow. Given that there was significant antegrade flow through the tips stent we elected not to perform further coil embolization. We did, however, elect to attempt recanalization of the superior mesenteric vein to improve flow into the portal vein. Revascularization of inferior mesenteric vein Microcatheter was removed after the coil embolization. The angled 035 catheter was then used to probe for the superior mesenteric vein with the use of a standard Glidewire. While we could not identify the superior mesenteric vein inflow/occluded stump, the catheter did enter the inferior mesenteric vein which was occluded at the inflow to the portal system. Angiogram was performed confirming retrograde flow and we elected to balloon angioplasty the origin. 035 wire was then advanced into the IMV and the catheter was removed. 6 mm balloon angioplasty was then performed at the IMV confluence. Balloon was removed catheter was advanced into the IMV and angiogram was performed demonstrating antegrade flow into the portal system. After review of images and the case we elected to withdraw at this time. The transhepatic portal vein access was withdrawn and embolized with a combination of MRey coils and Gel-Foam slurry. The trans splenic portal vein access was withdrawn and embolized with a series of tornado and MRey coils. The transjugular access was removed with manual pressure for hemostasis. Patient remained  hemodynamically stable throughout. No complications were encountered.  No significant blood loss. Benson wire was then passed through the Colapinto needle into the inferior mesenteric vein. Needle was removed and disposed. Pigtail marking catheter was then advanced over the wire for portal venogram confirming location. 6 mm balloon angioplasty was then performed of the soft tissue tract. Sheath was advanced over the deflating balloon into the portal venous system. Multipurpose angled catheter was advanced into the distal splenic vein in the splenic hilum, for splenic venogram. IMPRESSION: Status post ultrasound-guided right IJ venous access, ultrasound-guided trans splenic portal vein access, ultrasound-guided transhepatic portal vein access for revascularization of occluded portal vein and IMV, TIPS, and coil embolization of competing varices, as treatment for repeat episodes of GI bleeding related to portal hypertension. Signed, Yvone Neu. Miachel Roux, RPVI [facility] Electronically Signed   By: Gilmer Mor D.O.   On: 07/22/2022 08:59   IR EMBO ART  VEN HEMORR LYMPH EXTRAV  INC GUIDE ROADMAPPING  Result Date: 07/22/2022 CLINICAL DATA:  42 year old male with a history of chronic portal vein thrombosis and gastrointestinal bleeding, presents for portal vein recanalization, possible tips and possible embolization EXAM: ULTRASOUND-GUIDED ACCESS RIGHT INTERNAL JUGULAR VEIN ULTRASOUND-GUIDED TRANS SPLENIC ACCESS INTO THE PORTAL VEIN ULTRASOUND-GUIDED TRANSHEPATIC ACCESS INTO THE PORTAL VENOUS SYSTEM BALLOON ANGIOPLASTY AND RECANALIZATION OF OCCLUDED PORTAL VEIN TRANSJUGULAR INTRAHEPATIC PORTOSYSTEMIC SHUNT COIL EMBOLIZATION OF MULTIPLE COMPETING VARICES MEDICATIONS: As antibiotic prophylaxis, Rocephin 1 gm IV was ordered pre-procedure and administered intravenously within one hour of incision. ANESTHESIA/SEDATION: General - as administered by the  Anesthesia department Total intra-service moderate Sedation Time: 0 minutes. The patient's level of consciousness and vital signs were monitored continuously by radiology nursing throughout the procedure under my direct supervision. CONTRAST:  200 cc FLUOROSCOPY: Radiation Exposure Index (as provided by the fluoroscopic device): 4,096 mGy Kerma COMPLICATIONS: None PROCEDURE: Secondary to complexity of the case a second physician was required, with assistance from Dr Katherina Right, VIR. Informed written consent was obtained from the patient and the patient's family after a thorough discussion of the procedural risks, benefits and alternatives. Specific risks discussed with TIPS/variceal embolization included: Bleeding, infection, vascular injury, need for further procedure/surgery, renal injury/renal failure, contrast reaction, non-target embolization, liver dysfunction/failure, hepatic encephalopathy, stroke (~1%), cardiopulmonary collapse, death. All questions were addressed. Maximal Sterile Barrier Technique was utilized including caps, mask, sterile gowns, sterile gloves, sterile drape, hand hygiene and skin antiseptic. A timeout was performed prior to the initiation of the procedure. Patient was positioned supine position on the table, with the right upper quadrant, left upper quadrant, right inguinal region, and the right neck prepped and draped in the usual sterile fashion. Transjugular systemic venous access Ultrasound survey was then performed of the right neck, with images stored and sent to PACs, confirming patency of the right IJ. Ultrasound guidance was then used to access the right internal jugular vein with a micro puncture kit. The wire was advanced under fluoroscopy into the right atrium, and a small incision was made. The needle was removed and the dilator was placed. The micro wire in the stiffener were removed and an 035 wire was then passed into the inferior vena cava. Ten French soft tissue dilation  was performed on the wire, and then 10 [place] TIPS sheath was placed. Trans splenic access Ultrasound survey of the left upper quadrant was performed, with images stored and sent to PACs. Using ultrasound guidance, an Accustick set was used to access a splenic vein in the hilum of the spleen. The needle tip was confirmed with contrast injection under fluoroscopy. The 018 Mandril wire was passed centrally within the splenic vein. An incision was made with 11 blade scalpel and the needle was removed from the wire. The Accustick set was then advanced through the splenic tissue into the splenic vein and the Mandril wire, stiffener, and the inner dilator were removed. Injection of contrast confirmed location in the splenic vein. A standard 035 working wire was then placed through the 4 [place] sheath and the 4 [place] Accustick sheath was removed. A 25 cm 5 French Brite tip sheath was then placed through the spleen into the splenic vein. Multiple angiogram with varying obliquities were performed. After review of the images, we elected to attempt proceeding with antegrade recanalization of the occluded splenic vein/portal vein. A stiff Glidewire and an 035 Terumo Navicross catheter probe the occlusive region. After several attempts, we elected to withdraw from an antegrade attempt at  this time and acquire transhepatic access. Trans hepatic access Ultrasound survey of the right upper quadrant was performed, with images stored and sent to PACs. Using ultrasound guidance, Chiba needle was used access the peripheral right portal system. Once we confirmed position in the portal system with portal venography, micro wire was advanced centrally. The 018 wire passed fairly easily through the right portal vein and main portal vein retrograde. The Accustick system advanced into the portal system, and then the the stiffener, wire, and dilator were removed. Venogram confirmed are location within the portal system. Stiff Glidewire was  then advanced as far into the portal system as possible. This a then allowed replacement of the 4 [place] dilator with a 6 [place] standard bright tip sheath, 55 cm. Through the 6 [place] system we were able to cross the occluded portal vein to the site of inflow of developing caval transformation. We then used an angled 100 cm Davis catheter to find the occluded origin of the splenic vein, and the David catheter and glidewire were passed retrograde into the splenic vein. Angiogram confirmed location. We then passed an Amplatz wire through the transhepatic access into the splenic vein, and with some luck the Amplatz wire rendezvous with the 5 French sheath from the trans splenic access. Balloon angioplasty was then performed along the length of the occluded portal system with 6 mm balloon. Angiogram was performed. 8 mm x 80 mm balloon angioplasty was then performed along the length of the portal system. Angiogram was performed. We then elected to proceed with tips, with the strategy of an inflated balloon as the target in the portal system. 6 mm by 60 mm balloon was then inflated in the portal vein as a target. Transjugular intrahepatic portosystemic shunt Combination of Benson wire and multipurpose angled catheter were used to select the middle hepatic vein. Venogram confirmed location within the vein. Tip sheath was then advanced over the catheter with a coaxial Amplatz wire, for a position cephalad to the transition point of the wire in the portal venous system. Venogram of the hepatic venous system was performed both in a frontal projection and a right anterior oblique projection. The Colapinto needle was then advanced through the TIPS sheath housed in the Teflon sheath over the Amplatz wire. Using frontal and oblique projections, first attempt of portal access was performed. This resulted in prolapse of the system into the IVC and the needle was removed for further access into the selected hepatic vein. Bentson  wire and the multipurpose angled catheter were again used to select the middle hepatic vein. Once the catheter was distal the Amplatz wire was advanced and then the sheath was further advanced into the selected middle hepatic vein. The protective Teflon sheath and the colapinto needle were again advanced on the Amplatz wire which was then removed. From this position approximately 5 passes were necessary to access the portal venous system, by way of targeting the balloon. The balloon was in fact not punctured and the needle entered the portal vein adjacent to the balloon. Access into the portal venous system was confirmed by advancing a Glidewire distally into the splenic vein. Needle and the Teflon protective sheath were removed. A 100 cm 4 French glide cath was then advanced over the Glidewire into the portal system. The Glidewire was removed and Amplatz wire was then placed into the splenic vein to continue. A marking pigtail catheter was then advanced into the splenic vein. Angiogram was performed to confirm location. The position of the  marking pigtail with then used to estimate the length of our tips stent, and we selected 10 mm-80 mm for placement. Marking pigtail catheter was then removed on an Amplatz wire. Balloon angioplasty of 6 mm x 40 mm was then performed along the tissue tract. Sheath was advanced on the balloon as a deflated and once the sheath was into the portal vein and angiogram was performed to confirm the tips stent target. Before placing the tips stent final balloon angioplasty was performed in the splenic vein with a 10 mm by 80 mm balloon. The tip stent was then deployed and post dilated with the 10 mm balloon. Balloon angioplasty was performed along the length of the previously occluded portal vein. Balloon was removed and angiogram was performed. Pigtail catheter was advanced into the splenic vein for repeat angiogram. After review of the images we elected to proceed with coil embolization  of the visualized varices in order to direct flow through the portal venous system. Embolization of varices The angled 035 catheter was placed into the splenic vein and a coaxial penumbra microcatheter was advanced. Combination of 014 fathom wire and the penumbra microcatheter were used to select the varices from the splenic vein. With safe position, coil embolization was performed with a series of penumbra Ruby coils. Repeat angiogram demonstrated some additional varices that we elected to coil embolize. The same microcatheter, microwire were used to select the varices. Coil embolization performed with additional series of penumbra Ruby coils. Repeat venogram demonstrated stasis of the embolized varices. Additional varices were filling, however, were less apparent on the entrance flow. Given that there was significant antegrade flow through the tips stent we elected not to perform further coil embolization. We did, however, elect to attempt recanalization of the superior mesenteric vein to improve flow into the portal vein. Revascularization of inferior mesenteric vein Microcatheter was removed after the coil embolization. The angled 035 catheter was then used to probe for the superior mesenteric vein with the use of a standard Glidewire. While we could not identify the superior mesenteric vein inflow/occluded stump, the catheter did enter the inferior mesenteric vein which was occluded at the inflow to the portal system. Angiogram was performed confirming retrograde flow and we elected to balloon angioplasty the origin. 035 wire was then advanced into the IMV and the catheter was removed. 6 mm balloon angioplasty was then performed at the IMV confluence. Balloon was removed catheter was advanced into the IMV and angiogram was performed demonstrating antegrade flow into the portal system. After review of images and the case we elected to withdraw at this time. The transhepatic portal vein access was withdrawn and  embolized with a combination of MRey coils and Gel-Foam slurry. The trans splenic portal vein access was withdrawn and embolized with a series of tornado and MRey coils. The transjugular access was removed with manual pressure for hemostasis. Patient remained hemodynamically stable throughout. No complications were encountered.  No significant blood loss. Benson wire was then passed through the Colapinto needle into the inferior mesenteric vein. Needle was removed and disposed. Pigtail marking catheter was then advanced over the wire for portal venogram confirming location. 6 mm balloon angioplasty was then performed of the soft tissue tract. Sheath was advanced over the deflating balloon into the portal venous system. Multipurpose angled catheter was advanced into the distal splenic vein in the splenic hilum, for splenic venogram. IMPRESSION: Status post ultrasound-guided right IJ venous access, ultrasound-guided trans splenic portal vein access, ultrasound-guided transhepatic portal vein access for  revascularization of occluded portal vein and IMV, TIPS, and coil embolization of competing varices, as treatment for repeat episodes of GI bleeding related to portal hypertension. Signed, Yvone Neu. Miachel Roux, RPVI [facility] Radiology Electronically Signed   By: Gilmer Mor D.O.   On: 07/22/2022 08:59   IR US Guide Vasc Access Right  Result Date: 07/22/2022 CLINICAL DATA:  42 year old male with a history of chronic portal vein thrombosis and gastrointestinal bleeding, presents for portal vein recanalization, possible tips and possible embolization EXAM: ULTRASOUND-GUIDED ACCESS RIGHT INTERNAL JUGULAR VEIN ULTRASOUND-GUIDED TRANS SPLENIC ACCESS INTO THE PORTAL VEIN ULTRASOUND-GUIDED TRANSHEPATIC ACCESS INTO THE PORTAL VENOUS SYSTEM BALLOON ANGIOPLASTY AND RECANALIZATION OF OCCLUDED PORTAL VEIN TRANSJUGULAR INTRAHEPATIC PORTOSYSTEMIC SHUNT COIL EMBOLIZATION OF  MULTIPLE COMPETING VARICES MEDICATIONS: As antibiotic prophylaxis, Rocephin 1 gm IV was ordered pre-procedure and administered intravenously within one hour of incision. ANESTHESIA/SEDATION: General - as administered by the Anesthesia department Total intra-service moderate Sedation Time: 0 minutes. The patient's level of consciousness and vital signs were monitored continuously by radiology nursing throughout the procedure under my direct supervision. CONTRAST:  200 cc FLUOROSCOPY: Radiation Exposure Index (as provided by the fluoroscopic device): 4,096 mGy Kerma COMPLICATIONS: None PROCEDURE: Secondary to complexity of the case a second physician was required, with assistance from Dr Katherina Right, VIR. Informed written consent was obtained from the patient and the patient's family after a thorough discussion of the procedural risks, benefits and alternatives. Specific risks discussed with TIPS/variceal embolization included: Bleeding, infection, vascular injury, need for further procedure/surgery, renal injury/renal failure, contrast reaction, non-target embolization, liver dysfunction/failure, hepatic encephalopathy, stroke (~1%), cardiopulmonary collapse, death. All questions were addressed. Maximal Sterile Barrier Technique was utilized including caps, mask, sterile gowns, sterile gloves, sterile drape, hand hygiene and skin antiseptic. A timeout was performed prior to the initiation of the procedure. Patient was positioned supine position on the table, with the right upper quadrant, left upper quadrant, right inguinal region, and the right neck prepped and draped in the usual sterile fashion. Transjugular systemic venous access Ultrasound survey was then performed of the right neck, with images stored and sent to PACs, confirming patency of the right IJ. Ultrasound guidance was then used to access the right internal jugular vein with a micro puncture kit. The wire was advanced under fluoroscopy into the right  atrium, and a small incision was made. The needle was removed and the dilator was placed. The micro wire in the stiffener were removed and an 035 wire was then passed into the inferior vena cava. Ten French soft tissue dilation was performed on the wire, and then 10 [place] TIPS sheath was placed. Trans splenic access Ultrasound survey of the left upper quadrant was performed, with images stored and sent to PACs. Using ultrasound guidance, an Accustick set was used to access a splenic vein in the hilum of the spleen. The needle tip was confirmed with contrast injection under fluoroscopy. The 018 Mandril wire was passed centrally within the splenic vein. An incision was made with 11 blade scalpel and the needle was removed from the wire. The Accustick set was then advanced through the splenic tissue into the splenic vein and the Mandril wire, stiffener, and the inner dilator were removed. Injection of contrast confirmed location in the splenic vein. A standard 035 working wire was then placed through the 4 [place] sheath and the 4 [place] Accustick sheath was removed. A 25 cm 5 French Brite tip sheath was then placed through the spleen into  the splenic vein. Multiple angiogram with varying obliquities were performed. After review of the images, we elected to attempt proceeding with antegrade recanalization of the occluded splenic vein/portal vein. A stiff Glidewire and an 035 Terumo Navicross catheter probe the occlusive region. After several attempts, we elected to withdraw from an antegrade attempt at this time and acquire transhepatic access. Trans hepatic access Ultrasound survey of the right upper quadrant was performed, with images stored and sent to PACs. Using ultrasound guidance, Chiba needle was used access the peripheral right portal system. Once we confirmed position in the portal system with portal venography, micro wire was advanced centrally. The 018 wire passed fairly easily through the right portal vein  and main portal vein retrograde. The Accustick system advanced into the portal system, and then the the stiffener, wire, and dilator were removed. Venogram confirmed are location within the portal system. Stiff Glidewire was then advanced as far into the portal system as possible. This a then allowed replacement of the 4 [place] dilator with a 6 [place] standard bright tip sheath, 55 cm. Through the 6 [place] system we were able to cross the occluded portal vein to the site of inflow of developing caval transformation. We then used an angled 100 cm Davis catheter to find the occluded origin of the splenic vein, and the David catheter and glidewire were passed retrograde into the splenic vein. Angiogram confirmed location. We then passed an Amplatz wire through the transhepatic access into the splenic vein, and with some luck the Amplatz wire rendezvous with the 5 French sheath from the trans splenic access. Balloon angioplasty was then performed along the length of the occluded portal system with 6 mm balloon. Angiogram was performed. 8 mm x 80 mm balloon angioplasty was then performed along the length of the portal system. Angiogram was performed. We then elected to proceed with tips, with the strategy of an inflated balloon as the target in the portal system. 6 mm by 60 mm balloon was then inflated in the portal vein as a target. Transjugular intrahepatic portosystemic shunt Combination of Benson wire and multipurpose angled catheter were used to select the middle hepatic vein. Venogram confirmed location within the vein. Tip sheath was then advanced over the catheter with a coaxial Amplatz wire, for a position cephalad to the transition point of the wire in the portal venous system. Venogram of the hepatic venous system was performed both in a frontal projection and a right anterior oblique projection. The Colapinto needle was then advanced through the TIPS sheath housed in the Teflon sheath over the Amplatz wire.  Using frontal and oblique projections, first attempt of portal access was performed. This resulted in prolapse of the system into the IVC and the needle was removed for further access into the selected hepatic vein. Bentson wire and the multipurpose angled catheter were again used to select the middle hepatic vein. Once the catheter was distal the Amplatz wire was advanced and then the sheath was further advanced into the selected middle hepatic vein. The protective Teflon sheath and the colapinto needle were again advanced on the Amplatz wire which was then removed. From this position approximately 5 passes were necessary to access the portal venous system, by way of targeting the balloon. The balloon was in fact not punctured and the needle entered the portal vein adjacent to the balloon. Access into the portal venous system was confirmed by advancing a Glidewire distally into the splenic vein. Needle and the Teflon protective sheath were removed.  A 100 cm 4 French glide cath was then advanced over the Glidewire into the portal system. The Glidewire was removed and Amplatz wire was then placed into the splenic vein to continue. A marking pigtail catheter was then advanced into the splenic vein. Angiogram was performed to confirm location. The position of the marking pigtail with then used to estimate the length of our tips stent, and we selected 10 mm-80 mm for placement. Marking pigtail catheter was then removed on an Amplatz wire. Balloon angioplasty of 6 mm x 40 mm was then performed along the tissue tract. Sheath was advanced on the balloon as a deflated and once the sheath was into the portal vein and angiogram was performed to confirm the tips stent target. Before placing the tips stent final balloon angioplasty was performed in the splenic vein with a 10 mm by 80 mm balloon. The tip stent was then deployed and post dilated with the 10 mm balloon. Balloon angioplasty was performed along the length of the  previously occluded portal vein. Balloon was removed and angiogram was performed. Pigtail catheter was advanced into the splenic vein for repeat angiogram. After review of the images we elected to proceed with coil embolization of the visualized varices in order to direct flow through the portal venous system. Embolization of varices The angled 035 catheter was placed into the splenic vein and a coaxial penumbra microcatheter was advanced. Combination of 014 fathom wire and the penumbra microcatheter were used to select the varices from the splenic vein. With safe position, coil embolization was performed with a series of penumbra Ruby coils. Repeat angiogram demonstrated some additional varices that we elected to coil embolize. The same microcatheter, microwire were used to select the varices. Coil embolization performed with additional series of penumbra Ruby coils. Repeat venogram demonstrated stasis of the embolized varices. Additional varices were filling, however, were less apparent on the entrance flow. Given that there was significant antegrade flow through the tips stent we elected not to perform further coil embolization. We did, however, elect to attempt recanalization of the superior mesenteric vein to improve flow into the portal vein. Revascularization of inferior mesenteric vein Microcatheter was removed after the coil embolization. The angled 035 catheter was then used to probe for the superior mesenteric vein with the use of a standard Glidewire. While we could not identify the superior mesenteric vein inflow/occluded stump, the catheter did enter the inferior mesenteric vein which was occluded at the inflow to the portal system. Angiogram was performed confirming retrograde flow and we elected to balloon angioplasty the origin. 035 wire was then advanced into the IMV and the catheter was removed. 6 mm balloon angioplasty was then performed at the IMV confluence. Balloon was removed catheter was  advanced into the IMV and angiogram was performed demonstrating antegrade flow into the portal system. After review of images and the case we elected to withdraw at this time. The transhepatic portal vein access was withdrawn and embolized with a combination of MRey coils and Gel-Foam slurry. The trans splenic portal vein access was withdrawn and embolized with a series of tornado and MRey coils. The transjugular access was removed with manual pressure for hemostasis. Patient remained hemodynamically stable throughout. No complications were encountered.  No significant blood loss. Benson wire was then passed through the Colapinto needle into the inferior mesenteric vein. Needle was removed and disposed. Pigtail marking catheter was then advanced over the wire for portal venogram confirming location. 6 mm balloon angioplasty was then performed  of the soft tissue tract. Sheath was advanced over the deflating balloon into the portal venous system. Multipurpose angled catheter was advanced into the distal splenic vein in the splenic hilum, for splenic venogram. IMPRESSION: Status post ultrasound-guided right IJ venous access, ultrasound-guided trans splenic portal vein access, ultrasound-guided transhepatic portal vein access for revascularization of occluded portal vein and IMV, TIPS, and coil embolization of competing varices, as treatment for repeat episodes of GI bleeding related to portal hypertension. Signed, Yvone Neu. Miachel Roux, RPVI [facility] Radiology Electronically Signed   By: Gilmer Mor D.O.   On: 07/22/2022 08:59   DG Chest Port 1 View  Result Date: 07/21/2022 CLINICAL DATA:  Shortness of breath EXAM: PORTABLE CHEST 1 VIEW COMPARISON:  07/12/2022 FINDINGS: Low lung volumes with subsegmental atelectasis at the bases. No consolidation or pleural effusion. Stable cardiomediastinal silhouette. No pneumothorax. Numerous embolization coils in the  left upper quadrant which are new compared to the prior exam. IMPRESSION: Low lung volumes with subsegmental atelectasis at the bases. Electronically Signed   By: Jasmine Pang M.D.   On: 07/21/2022 17:49    Labs:  CBC: Recent Labs    07/22/22 1227 07/22/22 1727 07/23/22 1224 07/24/22 0457  WBC 12.7* 11.2* 10.7* 10.4  HGB 7.6* 7.3* 7.5* 6.5*  HCT 24.2* 23.5* 23.8* 21.1*  PLT 204 189 204 177    COAGS: Recent Labs    07/12/22 0835 07/20/22 0537  INR 1.2 1.1  APTT 27  --     BMP: Recent Labs    07/21/22 0651 07/22/22 0542 07/23/22 1224 07/24/22 0457  NA 138 138 138 137  K 4.1 4.0 3.5 3.5  CL 107 105 106 108  CO2 26 23 24 22   GLUCOSE 114* 126* 107* 107*  BUN 12 11 10 8   CALCIUM 8.4* 8.4* 8.4* 8.2*  CREATININE 0.88 0.91 0.80 0.78  GFRNONAA >60 >60 >60 >60    LIVER FUNCTION TESTS: Recent Labs    07/12/22 1703 07/13/22 0618 07/14/22 0735 07/20/22 0537  BILITOT 1.0 0.7 0.6 0.8  AST 20 17 17 20   ALT 17 17 17 31   ALKPHOS 40 36* 39 52  PROT 4.9* 4.3* 4.9* 5.3*  ALBUMIN 2.8* 2.3* 2.5* 2.8*    Assessment and Plan:  42 y/o M with 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 Loreta Ave) seen today for follow up.  Patent with right chest/RUQ pain at puncture site, improving day by day - on exam today appears to be most consistent with intercostal hematoma/swelling given reported symptoms, fullness/firmness to palpation when compared to left chest, lack of superficial bruising and improvement with ice. Recommend continuing ice PRN, discussed trial of heat PRN after 48 hours of ice and scheduled NSAIDs if pain persists. Does not appear to need repeat imaging at this time given continued improvement and localized pain location correlating with known puncture site.  Hgb drop overnight to 6.5 from 7.5, previously relatively stable post procedure. Currently receiving transfusion on exam.  Slightly tachy today (107 max recorded) , BP normotensive, denies any s/s of acute blood loss. ? Hemodilution. Recommend continuing to trend CBC.  Patient reports receiving lactulose 5/23 for constipation but has not received it since. Per [facility] appears this was ordered for 3 doses only. I have restarted lactulose 30 g TID today - should be titrated to 2-3 soft BMs per day and will need to be continued on discharge. He  has had multiple bowel movements over last 24H from previous medications for constipation. Ammonia 38 but clinically asymptomatic. Lactulose rx sent to outpatient pharmacy and discussed with patient that we will adjust dose PRN at outpatient follow ups - he is encouraged to call if he is regularly having >3 or < 2 BMs QD at discharge. Reviewed hepatic encephalopathy s/s with patient and his parents today who state understanding.  Per patient potential d/c tomorrow - he has outpatient IR follow up ordered and our contact information is in AVS, he was encouraged to call us at anytime with questions or concerns.   IR will follow up patient tomorrow if remains in house.  Electronically Signed: Villa Herb, PA-C 07/24/2022, 1:46 PM   I spent a total of 25 Minutes at the the patient's bedside AND on the patient's hospital floor or unit, greater than 50% of which was counseling/coordinating care for TIPS.

## 2022-07-25 DIAGNOSIS — K922 Gastrointestinal hemorrhage, unspecified: Secondary | ICD-10-CM | POA: Diagnosis not present

## 2022-07-25 LAB — PROCALCITONIN: Procalcitonin: <0.1 ng/mL

## 2022-07-25 LAB — TYPE AND SCREEN
ABO/RH(D): A POS
Antibody Screen: NEGATIVE
Unit division: 0
Unit division: 0

## 2022-07-25 LAB — CBC
HCT: 26.1 % — ABNORMAL LOW (ref 39.0–52.0)
Hemoglobin: 8.2 g/dL — ABNORMAL LOW (ref 13.0–17.0)
MCH: 26.3 pg (ref 26.0–34.0)
MCHC: 31.4 g/dL (ref 30.0–36.0)
MCV: 83.7 fL (ref 80.0–100.0)
Platelets: 187 10*3/uL (ref 150–400)
RBC: 3.12 MIL/uL — ABNORMAL LOW (ref 4.22–5.81)
RDW: 17 % — ABNORMAL HIGH (ref 11.5–15.5)
WBC: 9 10*3/uL (ref 4.0–10.5)
nRBC: 0 % (ref 0.0–0.2)

## 2022-07-25 LAB — BPAM RBC
Blood Product Expiration Date: 202406202359
Blood Product Expiration Date: 202406202359
ISSUE DATE / TIME: 202405251237
Unit Type and Rh: 6200

## 2022-07-25 LAB — BASIC METABOLIC PANEL
Anion gap: 9 (ref 5–15)
BUN: 9 mg/dL (ref 6–20)
CO2: 22 mmol/L (ref 22–32)
Calcium: 8.1 mg/dL — ABNORMAL LOW (ref 8.9–10.3)
Chloride: 107 mmol/L (ref 98–111)
Creatinine, Ser: 0.88 mg/dL (ref 0.61–1.24)
GFR, Estimated: >60 mL/min (ref >60)
Glucose, Bld: 105 mg/dL — ABNORMAL HIGH (ref 70–99)
Potassium: 3.6 mmol/L (ref 3.5–5.1)
Sodium: 138 mmol/L (ref 135–145)

## 2022-07-25 LAB — AMMONIA: Ammonia: 25 umol/L (ref 9–35)

## 2022-07-25 LAB — C-REACTIVE PROTEIN: CRP: 5.4 mg/dL — ABNORMAL HIGH (ref <1.0)

## 2022-07-25 LAB — BRAIN NATRIURETIC PEPTIDE: B Natriuretic Peptide: 33.3 pg/mL (ref 0.0–100.0)

## 2022-07-25 MED ORDER — HYDROCODONE-ACETAMINOPHEN 5-325 MG PO TABS: 1.0000 | ORAL_TABLET | Freq: Three times a day (TID) | ORAL | 0 refills | Status: DC | PRN

## 2022-07-25 MED ORDER — FOLIC ACID 1 MG PO TABS: 1.0000 mg | ORAL_TABLET | Freq: Every day | ORAL | 0 refills | Status: DC

## 2022-07-25 MED ORDER — MIDODRINE HCL 10 MG PO TABS: 10.0000 mg | ORAL_TABLET | Freq: Two times a day (BID) | ORAL | 0 refills | Status: DC

## 2022-07-25 MED ORDER — IRON (FERROUS SULFATE) 325 (65 FE) MG PO TABS: 325.0000 mg | ORAL_TABLET | Freq: Every day | ORAL | 0 refills | Status: DC

## 2022-07-25 NOTE — Discharge Summary (Signed)
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 Accustick sheath was removed. A 25 cm 5 French Brite tip sheath was then placed through the spleen into the splenic vein. Multiple angiogram with varying obliquities were performed. After review of the images, we elected to attempt proceeding with antegrade recanalization of the occluded splenic vein/portal vein. A stiff Glidewire and an 035 Terumo Navicross catheter probe the occlusive region. After several attempts, we elected to withdraw from an antegrade attempt at this time and acquire transhepatic access. Trans hepatic access Ultrasound survey of the right upper quadrant was performed, with images stored and sent to PACs. Using ultrasound guidance, Chiba needle was used access the peripheral right portal system. Once we confirmed position in the portal system with portal venography, micro wire was advanced centrally. The 018 wire passed fairly easily  through the right portal vein and main portal vein retrograde. The Accustick system advanced into the portal system, and then the the stiffener, wire,  and dilator were removed. Venogram confirmed are location within the portal system. Stiff Glidewire was then advanced as far into the portal system as possible. This a then allowed replacement of the 4 [place] dilator with a 6 [place] standard bright tip sheath, 55 cm. Through the 6 [place] system we were able to cross the occluded portal vein to the site of inflow of developing caval transformation. We then used an angled 100 cm Davis catheter to find the occluded origin of the splenic vein, and the David catheter and glidewire were passed retrograde into the splenic vein. Angiogram confirmed location. We then passed an Amplatz wire through the transhepatic access into the splenic vein, and with some luck the Amplatz wire rendezvous with the 5 French sheath from the trans splenic access. Balloon angioplasty was then performed along the length of the occluded portal system with 6 mm balloon. Angiogram was performed. 8 mm x 80 mm balloon angioplasty was then performed along the length of the portal system. Angiogram was performed. We then elected to proceed with tips, with the strategy of an inflated balloon as the target in the portal system. 6 mm by 60 mm balloon was then inflated in the portal vein as a target. Transjugular intrahepatic portosystemic shunt Combination of Benson wire and multipurpose angled catheter were used to select the middle hepatic vein. Venogram confirmed location within the vein. Tip sheath was then advanced over the catheter with a coaxial Amplatz wire, for a position cephalad to the transition point of the wire in the portal venous system. Venogram of the hepatic venous system was performed both in a frontal projection and a right anterior oblique projection. The Colapinto needle was then advanced through the TIPS sheath housed in the Teflon  sheath over the Amplatz wire. Using frontal and oblique projections, first attempt of portal access was performed. This resulted in prolapse of the system into the IVC and the needle was removed for further access into the selected hepatic vein. Bentson wire and the multipurpose angled catheter were again used to select the middle hepatic vein. Once the catheter was distal the Amplatz wire was advanced and then the sheath was further advanced into the selected middle hepatic vein. The protective Teflon sheath and the colapinto needle were again advanced on the Amplatz wire which was then removed. From this position approximately 5 passes were necessary to access the portal venous system, by way of targeting the balloon. The balloon was in fact not punctured and the needle entered the portal vein adjacent to the balloon. Access into the portal venous system was confirmed by advancing a Glidewire distally into the splenic vein. Needle and the Teflon protective sheath were removed. A 100 cm 4 French glide cath was then advanced over the Glidewire into the portal system. The Glidewire was removed and Amplatz wire was then placed into the splenic vein to continue. A marking pigtail catheter was then advanced into the splenic vein. Angiogram was performed to confirm location. The position of the marking pigtail with then used to estimate the length of our tips stent, and we selected 10 mm-80 mm for placement. Marking pigtail catheter was then removed on an Amplatz wire. Balloon angioplasty of 6 mm x 40 mm was then performed along the tissue tract. Sheath was advanced on the balloon as a deflated and once the sheath was into the portal vein and angiogram was performed to confirm the tips stent target. Before placing the tips stent final balloon angioplasty was performed in  the splenic vein with a 10 mm by 80 mm balloon. The tip stent was then deployed and post dilated with the 10 mm balloon. Balloon angioplasty was  performed along the length of the previously occluded portal vein. Balloon was removed and angiogram was performed. Pigtail catheter was advanced into the splenic vein for repeat angiogram. After review of the images we elected to proceed with coil embolization of the visualized varices in order to direct flow through the portal venous system. Embolization of varices The angled 035 catheter was placed into the splenic vein and a coaxial penumbra microcatheter was advanced. Combination of 014 fathom wire and the penumbra microcatheter were used to select the varices from the splenic vein. With safe position, coil embolization was performed with a series of penumbra Ruby coils. Repeat angiogram demonstrated some additional varices that we elected to coil embolize. The same microcatheter, microwire were used to select the varices. Coil embolization performed with additional series of penumbra Ruby coils. Repeat venogram demonstrated stasis of the embolized varices. Additional varices were filling, however, were less apparent on the entrance flow. Given that there was significant antegrade flow through the tips stent we elected not to perform further coil embolization. We did, however, elect to attempt recanalization of the superior mesenteric vein to improve flow into the portal vein. Revascularization of inferior mesenteric vein Microcatheter was removed after the coil embolization. The angled 035 catheter was then used to probe for the superior mesenteric vein with the use of a standard Glidewire. While we could not identify the superior mesenteric vein inflow/occluded stump, the catheter did enter the inferior mesenteric vein which was occluded at the inflow to the portal system. Angiogram was performed confirming retrograde flow and we elected to balloon angioplasty the origin. 035 wire was then advanced into the IMV and the catheter was removed. 6 mm balloon angioplasty was then performed at the IMV confluence.  Balloon was removed catheter was advanced into the IMV and angiogram was performed demonstrating antegrade flow into the portal system. After review of images and the case we elected to withdraw at this time. The transhepatic portal vein access was withdrawn and embolized with a combination of MRey coils and Gel-Foam slurry. The trans splenic portal vein access was withdrawn and embolized with a series of tornado and MRey coils. The transjugular access was removed with manual pressure for hemostasis. Patient remained hemodynamically stable throughout. No complications were encountered.  No significant blood loss. Benson wire was then passed through the Colapinto needle into the inferior mesenteric vein. Needle was removed and disposed. Pigtail marking catheter was then advanced over the wire for portal venogram confirming location. 6 mm balloon angioplasty was then performed of the soft tissue tract. Sheath was advanced over the deflating balloon into the portal venous system. Multipurpose angled catheter was advanced into the distal splenic vein in the splenic hilum, for splenic venogram. IMPRESSION: Status post ultrasound-guided right IJ venous access, ultrasound-guided trans splenic portal vein access, ultrasound-guided transhepatic portal vein access for revascularization of occluded portal vein and IMV, TIPS, and coil embolization of competing varices, as treatment for repeat episodes of GI bleeding related to portal hypertension. Signed, Yvone Neu. Miachel Roux, RPVI [facility] Radiology Electronically Signed   By: Gilmer Mor D.O.   On: 07/22/2022 08:59   IR US Guide Vasc Access Left  Result Date: 07/22/2022 CLINICAL DATA:  42 year old male with a history of chronic portal vein thrombosis and gastrointestinal bleeding, presents for portal  vein recanalization, possible tips and possible embolization EXAM: ULTRASOUND-GUIDED ACCESS RIGHT INTERNAL JUGULAR  VEIN ULTRASOUND-GUIDED TRANS SPLENIC ACCESS INTO THE PORTAL VEIN ULTRASOUND-GUIDED TRANSHEPATIC ACCESS INTO THE PORTAL VENOUS SYSTEM BALLOON ANGIOPLASTY AND RECANALIZATION OF OCCLUDED PORTAL VEIN TRANSJUGULAR INTRAHEPATIC PORTOSYSTEMIC SHUNT COIL EMBOLIZATION OF MULTIPLE COMPETING VARICES MEDICATIONS: As antibiotic prophylaxis, Rocephin 1 gm IV was ordered pre-procedure and administered intravenously within one hour of incision. ANESTHESIA/SEDATION: General - as administered by the Anesthesia department Total intra-service moderate Sedation Time: 0 minutes. The patient's level of consciousness and vital signs were monitored continuously by radiology nursing throughout the procedure under my direct supervision. CONTRAST:  200 cc FLUOROSCOPY: Radiation Exposure Index (as provided by the fluoroscopic device): 4,096 mGy Kerma COMPLICATIONS: None PROCEDURE: Secondary to complexity of the case a second physician was required, with assistance from Dr Katherina Right, VIR. Informed written consent was obtained from the patient and the patient's family after a thorough discussion of the procedural risks, benefits and alternatives. Specific risks discussed with TIPS/variceal embolization included: Bleeding, infection, vascular injury, need for further procedure/surgery, renal injury/renal failure, contrast reaction, non-target embolization, liver dysfunction/failure, hepatic encephalopathy, stroke (~1%), cardiopulmonary collapse, death. All questions were addressed. Maximal Sterile Barrier Technique was utilized including caps, mask, sterile gowns, sterile gloves, sterile drape, hand hygiene and skin antiseptic. A timeout was performed prior to the initiation of the procedure. Patient was positioned supine position on the table, with the right upper quadrant, left upper quadrant, right inguinal region, and the right neck prepped and draped in the usual sterile fashion. Transjugular systemic venous access Ultrasound survey was then  performed of the right neck, with images stored and sent to PACs, confirming patency of the right IJ. Ultrasound guidance was then used to access the right internal jugular vein with a micro puncture kit. The wire was advanced under fluoroscopy into the right atrium, and a small incision was made. The needle was removed and the dilator was placed. The micro wire in the stiffener were removed and an 035 wire was then passed into the inferior vena cava. Ten French soft tissue dilation was performed on the wire, and then 10 [place] TIPS sheath was placed. Trans splenic access Ultrasound survey of the left upper quadrant was performed, with images stored and sent to PACs. Using ultrasound guidance, an Accustick set was used to access a splenic vein in the hilum of the spleen. The needle tip was confirmed with contrast injection under fluoroscopy. The 018 Mandril wire was passed centrally within the splenic vein. An incision was made with 11 blade scalpel and the needle was removed from the wire. The Accustick set was then advanced through the splenic tissue into the splenic vein and the Mandril wire, stiffener, and the inner dilator were removed. Injection of contrast confirmed location in the splenic vein. A standard 035 working wire was then placed through the 4 [place] sheath and the 4 [place] Accustick sheath was removed. A 25 cm 5 French Brite tip sheath was then placed through the spleen into the splenic vein. Multiple angiogram with varying obliquities were performed. After review of the images, we elected to attempt proceeding with antegrade recanalization of the occluded splenic vein/portal vein. A stiff Glidewire and an 035 Terumo Navicross catheter probe the occlusive region. After several attempts, we elected to withdraw from an antegrade attempt at this time and acquire transhepatic access. Trans hepatic access Ultrasound survey of the right upper quadrant was performed, with images stored and sent to PACs.  Using ultrasound guidance, Chiba needle  was used access the peripheral right portal system. Once we confirmed position in the portal system with portal venography, micro wire was advanced centrally. The 018 wire passed fairly easily through the right portal vein and main portal vein retrograde. The Accustick system advanced into the portal system, and then the the stiffener, wire, and dilator were removed. Venogram confirmed are location within the portal system. Stiff Glidewire was then advanced as far into the portal system as possible. This a then allowed replacement of the 4 [place] dilator with a 6 [place] standard bright tip sheath, 55 cm. Through the 6 [place] system we were able to cross the occluded portal vein to the site of inflow of developing caval transformation. We then used an angled 100 cm Davis catheter to find the occluded origin of the splenic vein, and the David catheter and glidewire were passed retrograde into the splenic vein. Angiogram confirmed location. We then passed an Amplatz wire through the transhepatic access into the splenic vein, and with some luck the Amplatz wire rendezvous with the 5 French sheath from the trans splenic access. Balloon angioplasty was then performed along the length of the occluded portal system with 6 mm balloon. Angiogram was performed. 8 mm x 80 mm balloon angioplasty was then performed along the length of the portal system. Angiogram was performed. We then elected to proceed with tips, with the strategy of an inflated balloon as the target in the portal system. 6 mm by 60 mm balloon was then inflated in the portal vein as a target. Transjugular intrahepatic portosystemic shunt Combination of Benson wire and multipurpose angled catheter were used to select the middle hepatic vein. Venogram confirmed location within the vein. Tip sheath was then advanced over the catheter with a coaxial Amplatz wire, for a position cephalad to the transition point of the wire in  the portal venous system. Venogram of the hepatic venous system was performed both in a frontal projection and a right anterior oblique projection. The Colapinto needle was then advanced through the TIPS sheath housed in the Teflon sheath over the Amplatz wire. Using frontal and oblique projections, first attempt of portal access was performed. This resulted in prolapse of the system into the IVC and the needle was removed for further access into the selected hepatic vein. Bentson wire and the multipurpose angled catheter were again used to select the middle hepatic vein. Once the catheter was distal the Amplatz wire was advanced and then the sheath was further advanced into the selected middle hepatic vein. The protective Teflon sheath and the colapinto needle were again advanced on the Amplatz wire which was then removed. From this position approximately 5 passes were necessary to access the portal venous system, by way of targeting the balloon. The balloon was in fact not punctured and the needle entered the portal vein adjacent to the balloon. Access into the portal venous system was confirmed by advancing a Glidewire distally into the splenic vein. Needle and the Teflon protective sheath were removed. A 100 cm 4 French glide cath was then advanced over the Glidewire into the portal system. The Glidewire was removed and Amplatz wire was then placed into the splenic vein to continue. A marking pigtail catheter was then advanced into the splenic vein. Angiogram was performed to confirm location. The position of the marking pigtail with then used to estimate the length of our tips stent, and we selected 10 mm-80 mm for placement. Marking pigtail catheter was then removed on an Amplatz wire.  Balloon angioplasty of 6 mm x 40 mm was then performed along the tissue tract. Sheath was advanced on the balloon as a deflated and once the sheath was into the portal vein and angiogram was performed to confirm the tips stent  target. Before placing the tips stent final balloon angioplasty was performed in the splenic vein with a 10 mm by 80 mm balloon. The tip stent was then deployed and post dilated with the 10 mm balloon. Balloon angioplasty was performed along the length of the previously occluded portal vein. Balloon was removed and angiogram was performed. Pigtail catheter was advanced into the splenic vein for repeat angiogram. After review of the images we elected to proceed with coil embolization of the visualized varices in order to direct flow through the portal venous system. Embolization of varices The angled 035 catheter was placed into the splenic vein and a coaxial penumbra microcatheter was advanced. Combination of 014 fathom wire and the penumbra microcatheter were used to select the varices from the splenic vein. With safe position, coil embolization was performed with a series of penumbra Ruby coils. Repeat angiogram demonstrated some additional varices that we elected to coil embolize. The same microcatheter, microwire were used to select the varices. Coil embolization performed with additional series of penumbra Ruby coils. Repeat venogram demonstrated stasis of the embolized varices. Additional varices were filling, however, were less apparent on the entrance flow. Given that there was significant antegrade flow through the tips stent we elected not to perform further coil embolization. We did, however, elect to attempt recanalization of the superior mesenteric vein to improve flow into the portal vein. Revascularization of inferior mesenteric vein Microcatheter was removed after the coil embolization. The angled 035 catheter was then used to probe for the superior mesenteric vein with the use of a standard Glidewire. While we could not identify the superior mesenteric vein inflow/occluded stump, the catheter did enter the inferior mesenteric vein which was occluded at the inflow to the portal system. Angiogram was  performed confirming retrograde flow and we elected to balloon angioplasty the origin. 035 wire was then advanced into the IMV and the catheter was removed. 6 mm balloon angioplasty was then performed at the IMV confluence. Balloon was removed catheter was advanced into the IMV and angiogram was performed demonstrating antegrade flow into the portal system. After review of images and the case we elected to withdraw at this time. The transhepatic portal vein access was withdrawn and embolized with a combination of MRey coils and Gel-Foam slurry. The trans splenic portal vein access was withdrawn and embolized with a series of tornado and MRey coils. The transjugular access was removed with manual pressure for hemostasis. Patient remained hemodynamically stable throughout. No complications were encountered.  No significant blood loss. Benson wire was then passed through the Colapinto needle into the inferior mesenteric vein. Needle was removed and disposed. Pigtail marking catheter was then advanced over the wire for portal venogram confirming location. 6 mm balloon angioplasty was then performed of the soft tissue tract. Sheath was advanced over the deflating balloon into the portal venous system. Multipurpose angled catheter was advanced into the distal splenic vein in the splenic hilum, for splenic venogram. IMPRESSION: Status post ultrasound-guided right IJ venous access, ultrasound-guided trans splenic portal vein access, ultrasound-guided transhepatic portal vein access for revascularization of occluded portal vein and IMV, TIPS, and coil embolization of competing varices, as treatment for repeat episodes of GI bleeding related to portal hypertension. Signed, Yvone Neu. Loreta Ave, DO,  ABVM, RPVI [facility] Radiology Electronically Signed   By: Gilmer Mor D.O.   On: 07/22/2022 08:59   IR EMBO ART  VEN HEMORR LYMPH EXTRAV  INC GUIDE ROADMAPPING  Result Date:  07/22/2022 CLINICAL DATA:  42 year old male with a history of chronic portal vein thrombosis and gastrointestinal bleeding, presents for portal vein recanalization, possible tips and possible embolization EXAM: ULTRASOUND-GUIDED ACCESS RIGHT INTERNAL JUGULAR VEIN ULTRASOUND-GUIDED TRANS SPLENIC ACCESS INTO THE PORTAL VEIN ULTRASOUND-GUIDED TRANSHEPATIC ACCESS INTO THE PORTAL VENOUS SYSTEM BALLOON ANGIOPLASTY AND RECANALIZATION OF OCCLUDED PORTAL VEIN TRANSJUGULAR INTRAHEPATIC PORTOSYSTEMIC SHUNT COIL EMBOLIZATION OF MULTIPLE COMPETING VARICES MEDICATIONS: As antibiotic prophylaxis, Rocephin 1 gm IV was ordered pre-procedure and administered intravenously within one hour of incision. ANESTHESIA/SEDATION: General - as administered by the Anesthesia department Total intra-service moderate Sedation Time: 0 minutes. The patient's level of consciousness and vital signs were monitored continuously by radiology nursing throughout the procedure under my direct supervision. CONTRAST:  200 cc FLUOROSCOPY: Radiation Exposure Index (as provided by the fluoroscopic device): 4,096 mGy Kerma COMPLICATIONS: None PROCEDURE: Secondary to complexity of the case a second physician was required, with assistance from Dr Katherina Right, VIR. Informed written consent was obtained from the patient and the patient's family after a thorough discussion of the procedural risks, benefits and alternatives. Specific risks discussed with TIPS/variceal embolization included: Bleeding, infection, vascular injury, need for further procedure/surgery, renal injury/renal failure, contrast reaction, non-target embolization, liver dysfunction/failure, hepatic encephalopathy, stroke (~1%), cardiopulmonary collapse, death. All questions were addressed. Maximal Sterile Barrier Technique was utilized including caps, mask, sterile gowns, sterile gloves, sterile drape, hand hygiene and skin antiseptic. A timeout was performed prior to the initiation of the procedure.  Patient was positioned supine position on the table, with the right upper quadrant, left upper quadrant, right inguinal region, and the right neck prepped and draped in the usual sterile fashion. Transjugular systemic venous access Ultrasound survey was then performed of the right neck, with images stored and sent to PACs, confirming patency of the right IJ. Ultrasound guidance was then used to access the right internal jugular vein with a micro puncture kit. The wire was advanced under fluoroscopy into the right atrium, and a small incision was made. The needle was removed and the dilator was placed. The micro wire in the stiffener were removed and an 035 wire was then passed into the inferior vena cava. Ten French soft tissue dilation was performed on the wire, and then 10 [place] TIPS sheath was placed. Trans splenic access Ultrasound survey of the left upper quadrant was performed, with images stored and sent to PACs. Using ultrasound guidance, an Accustick set was used to access a splenic vein in the hilum of the spleen. The needle tip was confirmed with contrast injection under fluoroscopy. The 018 Mandril wire was passed centrally within the splenic vein. An incision was made with 11 blade scalpel and the needle was removed from the wire. The Accustick set was then advanced through the splenic tissue into the splenic vein and the Mandril wire, stiffener, and the inner dilator were removed. Injection of contrast confirmed location in the splenic vein. A standard 035 working wire was then placed through the 4 [place] sheath and the 4 [place] Accustick sheath was removed. A 25 cm 5 French Brite tip sheath was then placed through the spleen into the splenic vein. Multiple angiogram with varying obliquities were performed. After review of the images, we elected to attempt proceeding with antegrade recanalization of the  occluded splenic vein/portal vein. A stiff Glidewire and an 035 Terumo Navicross catheter probe the  occlusive region. After several attempts, we elected to withdraw from an antegrade attempt at this time and acquire transhepatic access. Trans hepatic access Ultrasound survey of the right upper quadrant was performed, with images stored and sent to PACs. Using ultrasound guidance, Chiba needle was used access the peripheral right portal system. Once we confirmed position in the portal system with portal venography, micro wire was advanced centrally. The 018 wire passed fairly easily through the right portal vein and main portal vein retrograde. The Accustick system advanced into the portal system, and then the the stiffener, wire, and dilator were removed. Venogram confirmed are location within the portal system. Stiff Glidewire was then advanced as far into the portal system as possible. This a then allowed replacement of the 4 [place] dilator with a 6 [place] standard bright tip sheath, 55 cm. Through the 6 [place] system we were able to cross the occluded portal vein to the site of inflow of developing caval transformation. We then used an angled 100 cm Davis catheter to find the occluded origin of the splenic vein, and the David catheter and glidewire were passed retrograde into the splenic vein. Angiogram confirmed location. We then passed an Amplatz wire through the transhepatic access into the splenic vein, and with some luck the Amplatz wire rendezvous with the 5 French sheath from the trans splenic access. Balloon angioplasty was then performed along the length of the occluded portal system with 6 mm balloon. Angiogram was performed. 8 mm x 80 mm balloon angioplasty was then performed along the length of the portal system. Angiogram was performed. We then elected to proceed with tips, with the strategy of an inflated balloon as the target in the portal system. 6 mm by 60 mm balloon was then inflated in the portal vein as a target. Transjugular intrahepatic portosystemic shunt Combination of Benson wire and  multipurpose angled catheter were used to select the middle hepatic vein. Venogram confirmed location within the vein. Tip sheath was then advanced over the catheter with a coaxial Amplatz wire, for a position cephalad to the transition point of the wire in the portal venous system. Venogram of the hepatic venous system was performed both in a frontal projection and a right anterior oblique projection. The Colapinto needle was then advanced through the TIPS sheath housed in the Teflon sheath over the Amplatz wire. Using frontal and oblique projections, first attempt of portal access was performed. This resulted in prolapse of the system into the IVC and the needle was removed for further access into the selected hepatic vein. Bentson wire and the multipurpose angled catheter were again used to select the middle hepatic vein. Once the catheter was distal the Amplatz wire was advanced and then the sheath was further advanced into the selected middle hepatic vein. The protective Teflon sheath and the colapinto needle were again advanced on the Amplatz wire which was then removed. From this position approximately 5 passes were necessary to access the portal venous system, by way of targeting the balloon. The balloon was in fact not punctured and the needle entered the portal vein adjacent to the balloon. Access into the portal venous system was confirmed by advancing a Glidewire distally into the splenic vein. Needle and the Teflon protective sheath were removed. A 100 cm 4 French glide cath was then advanced over the Glidewire into the portal system. The Glidewire was removed and Amplatz wire was  then placed into the splenic vein to continue. A marking pigtail catheter was then advanced into the splenic vein. Angiogram was performed to confirm location. The position of the marking pigtail with then used to estimate the length of our tips stent, and we selected 10 mm-80 mm for placement. Marking pigtail catheter was  then removed on an Amplatz wire. Balloon angioplasty of 6 mm x 40 mm was then performed along the tissue tract. Sheath was advanced on the balloon as a deflated and once the sheath was into the portal vein and angiogram was performed to confirm the tips stent target. Before placing the tips stent final balloon angioplasty was performed in the splenic vein with a 10 mm by 80 mm balloon. The tip stent was then deployed and post dilated with the 10 mm balloon. Balloon angioplasty was performed along the length of the previously occluded portal vein. Balloon was removed and angiogram was performed. Pigtail catheter was advanced into the splenic vein for repeat angiogram. After review of the images we elected to proceed with coil embolization of the visualized varices in order to direct flow through the portal venous system. Embolization of varices The angled 035 catheter was placed into the splenic vein and a coaxial penumbra microcatheter was advanced. Combination of 014 fathom wire and the penumbra microcatheter were used to select the varices from the splenic vein. With safe position, coil embolization was performed with a series of penumbra Ruby coils. Repeat angiogram demonstrated some additional varices that we elected to coil embolize. The same microcatheter, microwire were used to select the varices. Coil embolization performed with additional series of penumbra Ruby coils. Repeat venogram demonstrated stasis of the embolized varices. Additional varices were filling, however, were less apparent on the entrance flow. Given that there was significant antegrade flow through the tips stent we elected not to perform further coil embolization. We did, however, elect to attempt recanalization of the superior mesenteric vein to improve flow into the portal vein. Revascularization of inferior mesenteric vein Microcatheter was removed after the coil embolization. The angled 035 catheter was then used to probe for the  superior mesenteric vein with the use of a standard Glidewire. While we could not identify the superior mesenteric vein inflow/occluded stump, the catheter did enter the inferior mesenteric vein which was occluded at the inflow to the portal system. Angiogram was performed confirming retrograde flow and we elected to balloon angioplasty the origin. 035 wire was then advanced into the IMV and the catheter was removed. 6 mm balloon angioplasty was then performed at the IMV confluence. Balloon was removed catheter was advanced into the IMV and angiogram was performed demonstrating antegrade flow into the portal system. After review of images and the case we elected to withdraw at this time. The transhepatic portal vein access was withdrawn and embolized with a combination of MRey coils and Gel-Foam slurry. The trans splenic portal vein access was withdrawn and embolized with a series of tornado and MRey coils. The transjugular access was removed with manual pressure for hemostasis. Patient remained hemodynamically stable throughout. No complications were encountered.  No significant blood loss. Benson wire was then passed through the Colapinto needle into the inferior mesenteric vein. Needle was removed and disposed. Pigtail marking catheter was then advanced over the wire for portal venogram confirming location. 6 mm balloon angioplasty was then performed of the soft tissue tract. Sheath was advanced over the deflating balloon into the portal venous system. Multipurpose angled catheter was advanced into the distal  splenic vein in the splenic hilum, for splenic venogram. IMPRESSION: Status post ultrasound-guided right IJ venous access, ultrasound-guided trans splenic portal vein access, ultrasound-guided transhepatic portal vein access for revascularization of occluded portal vein and IMV, TIPS, and coil embolization of competing varices, as treatment for repeat episodes of GI bleeding related to portal hypertension.  Signed, Yvone Neu. Miachel Roux, RPVI [facility] Radiology Electronically Signed   By: Gilmer Mor D.O.   On: 07/22/2022 08:59   IR US Guide Vasc Access Right  Result Date: 07/22/2022 CLINICAL DATA:  42 year old male with a history of chronic portal vein thrombosis and gastrointestinal bleeding, presents for portal vein recanalization, possible tips and possible embolization EXAM: ULTRASOUND-GUIDED ACCESS RIGHT INTERNAL JUGULAR VEIN ULTRASOUND-GUIDED TRANS SPLENIC ACCESS INTO THE PORTAL VEIN ULTRASOUND-GUIDED TRANSHEPATIC ACCESS INTO THE PORTAL VENOUS SYSTEM BALLOON ANGIOPLASTY AND RECANALIZATION OF OCCLUDED PORTAL VEIN TRANSJUGULAR INTRAHEPATIC PORTOSYSTEMIC SHUNT COIL EMBOLIZATION OF MULTIPLE COMPETING VARICES MEDICATIONS: As antibiotic prophylaxis, Rocephin 1 gm IV was ordered pre-procedure and administered intravenously within one hour of incision. ANESTHESIA/SEDATION: General - as administered by the Anesthesia department Total intra-service moderate Sedation Time: 0 minutes. The patient's level of consciousness and vital signs were monitored continuously by radiology nursing throughout the procedure under my direct supervision. CONTRAST:  200 cc FLUOROSCOPY: Radiation Exposure Index (as provided by the fluoroscopic device): 4,096 mGy Kerma COMPLICATIONS: None PROCEDURE: Secondary to complexity of the case a second physician was required, with assistance from Dr Katherina Right, VIR. Informed written consent was obtained from the patient and the patient's family after a thorough discussion of the procedural risks, benefits and alternatives. Specific risks discussed with TIPS/variceal embolization included: Bleeding, infection, vascular injury, need for further procedure/surgery, renal injury/renal failure, contrast reaction, non-target embolization, liver dysfunction/failure, hepatic encephalopathy, stroke (~1%), cardiopulmonary collapse, death. All questions  were addressed. Maximal Sterile Barrier Technique was utilized including caps, mask, sterile gowns, sterile gloves, sterile drape, hand hygiene and skin antiseptic. A timeout was performed prior to the initiation of the procedure. Patient was positioned supine position on the table, with the right upper quadrant, left upper quadrant, right inguinal region, and the right neck prepped and draped in the usual sterile fashion. Transjugular systemic venous access Ultrasound survey was then performed of the right neck, with images stored and sent to PACs, confirming patency of the right IJ. Ultrasound guidance was then used to access the right internal jugular vein with a micro puncture kit. The wire was advanced under fluoroscopy into the right atrium, and a small incision was made. The needle was removed and the dilator was placed. The micro wire in the stiffener were removed and an 035 wire was then passed into the inferior vena cava. Ten French soft tissue dilation was performed on the wire, and then 10 [place] TIPS sheath was placed. Trans splenic access Ultrasound survey of the left upper quadrant was performed, with images stored and sent to PACs. Using ultrasound guidance, an Accustick set was used to access a splenic vein in the hilum of the spleen. The needle tip was confirmed with contrast injection under fluoroscopy. The 018 Mandril wire was passed centrally within the splenic vein. An incision was made with 11 blade scalpel and the needle was removed from the wire. The Accustick set was then advanced through the splenic tissue into the splenic vein and the Mandril wire, stiffener, and the inner dilator were removed. Injection of contrast confirmed location in the splenic vein. A standard 035 working wire was then  placed through the 4 French sheath and the 4 [place] Accustick sheath was removed. A 25 cm 5 French Brite tip sheath was then placed through the spleen into the splenic vein. Multiple angiogram with  varying obliquities were performed. After review of the images, we elected to attempt proceeding with antegrade recanalization of the occluded splenic vein/portal vein. A stiff Glidewire and an 035 Terumo Navicross catheter probe the occlusive region. After several attempts, we elected to withdraw from an antegrade attempt at this time and acquire transhepatic access. Trans hepatic access Ultrasound survey of the right upper quadrant was performed, with images stored and sent to PACs. Using ultrasound guidance, Chiba needle was used access the peripheral right portal system. Once we confirmed position in the portal system with portal venography, micro wire was advanced centrally. The 018 wire passed fairly easily through the right portal vein and main portal vein retrograde. The Accustick system advanced into the portal system, and then the the stiffener, wire, and dilator were removed. Venogram confirmed are location within the portal system. Stiff Glidewire was then advanced as far into the portal system as possible. This a then allowed replacement of the 4 [place] dilator with a 6 [place] standard bright tip sheath, 55 cm. Through the 6 [place] system we were able to cross the occluded portal vein to the site of inflow of developing caval transformation. We then used an angled 100 cm Davis catheter to find the occluded origin of the splenic vein, and the David catheter and glidewire were passed retrograde into the splenic vein. Angiogram confirmed location. We then passed an Amplatz wire through the transhepatic access into the splenic vein, and with some luck the Amplatz wire rendezvous with the 5 French sheath from the trans splenic access. Balloon angioplasty was then performed along the length of the occluded portal system with 6 mm balloon. Angiogram was performed. 8 mm x 80 mm balloon angioplasty was then performed along the length of the portal system. Angiogram was performed. We then elected to proceed with  tips, with the strategy of an inflated balloon as the target in the portal system. 6 mm by 60 mm balloon was then inflated in the portal vein as a target. Transjugular intrahepatic portosystemic shunt Combination of Benson wire and multipurpose angled catheter were used to select the middle hepatic vein. Venogram confirmed location within the vein. Tip sheath was then advanced over the catheter with a coaxial Amplatz wire, for a position cephalad to the transition point of the wire in the portal venous system. Venogram of the hepatic venous system was performed both in a frontal projection and a right anterior oblique projection. The Colapinto needle was then advanced through the TIPS sheath housed in the Teflon sheath over the Amplatz wire. Using frontal and oblique projections, first attempt of portal access was performed. This resulted in prolapse of the system into the IVC and the needle was removed for further access into the selected hepatic vein. Bentson wire and the multipurpose angled catheter were again used to select the middle hepatic vein. Once the catheter was distal the Amplatz wire was advanced and then the sheath was further advanced into the selected middle hepatic vein. The protective Teflon sheath and the colapinto needle were again advanced on the Amplatz wire which was then removed. From this position approximately 5 passes were necessary to access the portal venous system, by way of targeting the balloon. The balloon was in fact not punctured and the needle entered the portal vein  adjacent to the balloon. Access into the portal venous system was confirmed by advancing a Glidewire distally into the splenic vein. Needle and the Teflon protective sheath were removed. A 100 cm 4 French glide cath was then advanced over the Glidewire into the portal system. The Glidewire was removed and Amplatz wire was then placed into the splenic vein to continue. A marking pigtail catheter was then advanced into  the splenic vein. Angiogram was performed to confirm location. The position of the marking pigtail with then used to estimate the length of our tips stent, and we selected 10 mm-80 mm for placement. Marking pigtail catheter was then removed on an Amplatz wire. Balloon angioplasty of 6 mm x 40 mm was then performed along the tissue tract. Sheath was advanced on the balloon as a deflated and once the sheath was into the portal vein and angiogram was performed to confirm the tips stent target. Before placing the tips stent final balloon angioplasty was performed in the splenic vein with a 10 mm by 80 mm balloon. The tip stent was then deployed and post dilated with the 10 mm balloon. Balloon angioplasty was performed along the length of the previously occluded portal vein. Balloon was removed and angiogram was performed. Pigtail catheter was advanced into the splenic vein for repeat angiogram. After review of the images we elected to proceed with coil embolization of the visualized varices in order to direct flow through the portal venous system. Embolization of varices The angled 035 catheter was placed into the splenic vein and a coaxial penumbra microcatheter was advanced. Combination of 014 fathom wire and the penumbra microcatheter were used to select the varices from the splenic vein. With safe position, coil embolization was performed with a series of penumbra Ruby coils. Repeat angiogram demonstrated some additional varices that we elected to coil embolize. The same microcatheter, microwire were used to select the varices. Coil embolization performed with additional series of penumbra Ruby coils. Repeat venogram demonstrated stasis of the embolized varices. Additional varices were filling, however, were less apparent on the entrance flow. Given that there was significant antegrade flow through the tips stent we elected not to perform further coil embolization. We did, however, elect to attempt recanalization of  the superior mesenteric vein to improve flow into the portal vein. Revascularization of inferior mesenteric vein Microcatheter was removed after the coil embolization. The angled 035 catheter was then used to probe for the superior mesenteric vein with the use of a standard Glidewire. While we could not identify the superior mesenteric vein inflow/occluded stump, the catheter did enter the inferior mesenteric vein which was occluded at the inflow to the portal system. Angiogram was performed confirming retrograde flow and we elected to balloon angioplasty the origin. 035 wire was then advanced into the IMV and the catheter was removed. 6 mm balloon angioplasty was then performed at the IMV confluence. Balloon was removed catheter was advanced into the IMV and angiogram was performed demonstrating antegrade flow into the portal system. After review of images and the case we elected to withdraw at this time. The transhepatic portal vein access was withdrawn and embolized with a combination of MRey coils and Gel-Foam slurry. The trans splenic portal vein access was withdrawn and embolized with a series of tornado and MRey coils. The transjugular access was removed with manual pressure for hemostasis. Patient remained hemodynamically stable throughout. No complications were encountered.  No significant blood loss. Teena Dunk wire was then passed through the Colapinto needle into the  inferior mesenteric vein. Needle was removed and disposed. Pigtail marking catheter was then advanced over the wire for portal venogram confirming location. 6 mm balloon angioplasty was then performed of the soft tissue tract. Sheath was advanced over the deflating balloon into the portal venous system. Multipurpose angled catheter was advanced into the distal splenic vein in the splenic hilum, for splenic venogram. IMPRESSION: Status post ultrasound-guided right IJ venous access, ultrasound-guided trans splenic portal vein access,  ultrasound-guided transhepatic portal vein access for revascularization of occluded portal vein and IMV, TIPS, and coil embolization of competing varices, as treatment for repeat episodes of GI bleeding related to portal hypertension. Signed, Yvone Neu. Miachel Roux, RPVI [facility] Radiology Electronically Signed   By: Gilmer Mor D.O.   On: 07/22/2022 08:59   DG Chest Port 1 View  Result Date: 07/21/2022 CLINICAL DATA:  Shortness of breath EXAM: PORTABLE CHEST 1 VIEW COMPARISON:  07/12/2022 FINDINGS: Low lung volumes with subsegmental atelectasis at the bases. No consolidation or pleural effusion. Stable cardiomediastinal silhouette. No pneumothorax. Numerous embolization coils in the left upper quadrant which are new compared to the prior exam. IMPRESSION: Low lung volumes with subsegmental atelectasis at the bases. Electronically Signed   By: Jasmine Pang M.D.   On: 07/21/2022 17:49   CT Angio Chest Pulmonary Embolism (PE) W or WO Contrast  Result Date: 07/12/2022 CLINICAL DATA:  Dyspnea, hypoxia. EXAM: CT ANGIOGRAPHY CHEST WITH CONTRAST TECHNIQUE: Multidetector CT imaging of the chest was performed using the standard protocol during bolus administration of intravenous contrast. Multiplanar CT image reconstructions and MIPs were obtained to evaluate the vascular anatomy. RADIATION DOSE REDUCTION: This exam was performed according to the departmental dose-optimization program which includes automated exposure control, adjustment of the mA and/or kV according to patient size and/or use of iterative reconstruction technique. CONTRAST:  75mL OMNIPAQUE IOHEXOL 350 MG/ML SOLN COMPARISON:  CTA GI bleed 07/12/2022.  Chest CT 02/20/2021. FINDINGS: Cardiovascular: Examination is limited secondary to timing of the contrast bolus and respiratory motion artifact. There several focal hypodense areas seen within segmental and subsegmental levels of the right upper  lobe and right lower lobe which are indeterminate for small pulmonary emboli for example axial image 5/66. The main pulmonary artery is normal in size. Heart and aorta are normal in size. There is no pericardial effusion. Mediastinum/Nodes: There are prominent, but nonenlarged left hilar lymph nodes. Visualized esophagus and thyroid gland are within normal limits. Lungs/Pleura: There is new dense airspace consolidation throughout the entire left lower lobe and minimally in the inferior left upper lobe. There are additional patchy ground-glass opacities throughout the left upper lobe. There is a opacification of left lower lobe bronchi. The right lung is clear. There is no pleural effusion or pneumothorax. Upper Abdomen: Small amount of ascites in the upper abdomen appears unchanged. Musculoskeletal: No chest wall abnormality. No acute or significant osseous findings. Review of the MIP images confirms the above findings. IMPRESSION: 1. Examination is limited secondary to timing of the contrast bolus and respiratory motion artifact. There are several focal hypodense areas seen within segmental and subsegmental levels of the right upper lobe and right lower lobe which are indeterminate for small pulmonary emboli. No evidence for right heart strain. 2. New dense airspace consolidation throughout the entire left lower lobe and minimally in the inferior left upper lobe. There is opacification of left lower lobe bronchi. Findings are concerning for pneumonia. Follow-up imaging recommended to confirm resolution. 3. Stable small amount of  ascites in the upper abdomen. Electronically Signed   By: Darliss Cheney M.D.   On: 07/12/2022 18:02   DG CHEST PORT 1 VIEW  Result Date: 07/12/2022 CLINICAL DATA:  Dyspnea. EXAM: PORTABLE CHEST 1 VIEW COMPARISON:  AP chest 02/20/2021 and CT chest 02/20/2021 FINDINGS: Cardiac silhouette and mediastinal contours within normal limits. There is a new heterogeneous airspace opacification of  the left mid and lower lung. The right lung appears clear. No definite pleural effusion. No pneumothorax. No acute skeletal abnormality. IMPRESSION: New heterogeneous airspace opacification of the left mid and lower lung, concerning for pneumonia. Note is made that on the CT abdomen and pelvis and frontal scout view of the CT approximately 7 hours earlier today at the left lower lung appears more clear. Question pneumonia versus recent aspiration. Electronically Signed   By: Neita Garnet M.D.   On: 07/12/2022 17:01   CT ANGIO GI BLEED  Result Date: 07/12/2022 CLINICAL DATA:  Evaluate for GI bleed.  Anemia. EXAM: CTA ABDOMEN AND PELVIS WITHOUT AND WITH CONTRAST TECHNIQUE: Multidetector CT imaging of the abdomen and pelvis was performed using the standard protocol during bolus administration of intravenous contrast. Multiplanar reconstructed images and MIPs were obtained and reviewed to evaluate the vascular anatomy. RADIATION DOSE REDUCTION: This exam was performed according to the departmental dose-optimization program which includes automated exposure control, adjustment of the mA and/or kV according to patient size and/or use of iterative reconstruction technique. CONTRAST:  OMNIPAQUE IOHEXOL 350 MG/ML SOLN COMPARISON:  05/07/2018 for FINDINGS: VASCULAR Aorta: Normal caliber aorta without aneurysm, dissection, vasculitis or significant stenosis. Celiac: Patent without evidence of aneurysm, dissection, vasculitis or significant stenosis. SMA: Patent without evidence of aneurysm, dissection, vasculitis or significant stenosis. Renals: Both renal arteries are patent without evidence of aneurysm, dissection, vasculitis, fibromuscular dysplasia or significant stenosis. IMA: Patent without evidence of aneurysm, dissection, vasculitis or significant stenosis. Inflow: Patent without evidence of aneurysm, dissection, vasculitis or significant stenosis. Proximal Outflow: Bilateral common femoral and visualized  portions of the superficial and profunda femoral arteries are patent without evidence of aneurysm, dissection, vasculitis or significant stenosis. 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 Review of the MIP images confirms the above findings. NON-VASCULAR Lower chest: No pleural effusion or airspace consolidation. Platelike atelectasis identified within the right middle lobe, lingula and both lower lobes Hepatobiliary: Multiple too small to characterize low-density liver lesions are again seen and appears similar to previous exam. Gallbladder appears normal. No wall thickening, inflammation or biliary dilatation. Pancreas: Unremarkable. No pancreatic ductal dilatation or surrounding inflammatory changes. Spleen: Mild splenomegaly measuring 13.6 cm. Stable from previous exam. Adrenals/Urinary Tract: Normal appearance of the kidneys. No hydronephrosis or mass. Punctate stone within interpolar collecting system of the left kidney noted, image 110/11. Stomach/Bowel: No signs of intraluminal contrast extravasation within the small or large bowel loops to suggest active GI bleed. Stomach appears normal. The appendix is visualized and is within normal limits. No bowel wall thickening, inflammation, or distension. Colonic diverticulosis noted. No signs of acute diverticulitis. Lymphatic: No enlarged abdominopelvic lymph nodes. Reproductive: Prostate is unremarkable. Other: New abdominal ascites identified with free fluid extending over the liver and spleen. No discrete fluid collections. No signs of hematoma. Musculoskeletal: No acute or significant osseous findings. IMPRESSION: 1. No signs of intraluminal contrast extravasation within the small or large bowel loops to suggest active GI bleed. 2. 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 Esophageal and  varices are identified 3. New abdominal ascites.  Findings may reflect progressive portal venous hypertension. 4. Splenomegaly.  Stable from previous exam 5. Nonobstructing left renal calculus. Electronically Signed   By: Signa Kell M.D.   On: 07/12/2022 09:44    Micro Results    No results found for this or any previous visit (from the past 240 hour(s)).  Today   Subjective    Talley Schlund today has no headache,no chest abdominal pain,no new weakness tingling or numbness, feels much better wants to go home today.    Objective   Blood pressure 124/78, pulse 99, temperature 98.2 F (36.8 C), temperature source Oral, resp. rate 20, height 6' (1.829 m), weight 108.9 kg, SpO2 94 %.   Intake/Output Summary (Last 24 hours) at 07/25/2022 0946 Last data filed at 07/24/2022 1515 Gross per 24 hour  Intake 590 ml  Output --  Net 590 ml    Exam  Awake Alert, No new F.N deficits,    [place].AT,PERRAL Supple Neck,   Symmetrical Chest wall movement, Good air movement bilaterally, CTAB RRR,No Gallops,   +ve B.Sounds, Abd Soft, Non tender,  No Cyanosis, Clubbing or edema    Data Review   Recent Labs  Lab 07/22/22 1727 07/23/22 1224 07/24/22 0457 07/24/22 1728 07/25/22 0519  WBC 11.2* 10.7* 10.4 10.4 9.0  HGB 7.3* 7.5* 6.5* 8.9* 8.2*  HCT 23.5* 23.8* 21.1* 28.6* 26.1*  PLT 189 204 177 198 187  MCV 86.7 86.5 84.4 82.7 83.7  MCH 26.9 27.3 26.0 25.7* 26.3  MCHC 31.1 31.5 30.8 31.1 31.4  RDW 15.7* 15.6* 15.3 16.9* 17.0*    Recent Labs  Lab 07/19/22 0543 07/20/22 0537 07/21/22 0651 07/22/22 0542 07/23/22 1224 07/24/22 0457 07/25/22 0519  NA 137 138 138 138 138 137 138  K 4.4 4.0 4.1 4.0 3.5 3.5 3.6  CL 106 109 107 105 106 108 107  CO2 25 22 26 23 24 22 22   ANIONGAP 6 7 5 10 8 7 9   GLUCOSE 115* 112* 114* 126* 107* 107* 105*  BUN 10 11 12 11 10 8 9   CREATININE 0.93 0.93 0.88 0.91 0.80 0.78 0.88  AST  --  20  --   --   --   --   --   ALT  --  31  --   --   --   --   --   ALKPHOS  --  52  --   --   --   --   --    BILITOT  --  0.8  --   --   --   --   --   ALBUMIN  --  2.8*  --   --   --   --   --   CRP  --   --   --  0.7 2.1* 3.8* 5.4*  PROCALCITON  --   --   --  <0.10 <0.10 <0.10 <0.10  INR  --  1.1  --   --   --   --   --   AMMONIA 15 36*  --  25 36* 38* 25  BNP 16.8 13.8  --  50.8 34.6 36.3 33.3  MG 2.2 2.1  --   --  1.9  --   --   CALCIUM 8.2* 8.2* 8.4* 8.4* 8.4* 8.2* 8.1*    Total Time in preparing paper work, data evaluation and todays exam - 35 minutes  Signature  -    [facility]  Thedore Mins M.D on 07/25/2022 at 9:46 AM   -  To page go to [email or web address]

## 2022-07-25 NOTE — Discharge Instructions (Signed)
Follow with Primary MD Joaquim Nam, MD in 7 days   Get CBC, CMP, Ammonia, Magnesium, Anaemia panel -  checked next visit with your primary MD   Activity: As tolerated with Full fall precautions use walker/cane & assistance as needed  Disposition Home     Diet: Heart Healthy   Special Instructions: If you have smoked or chewed Tobacco  in the last 2 yrs please stop smoking, stop any regular Alcohol  and or any Recreational drug use.  On your next visit with your primary care physician please Get Medicines reviewed and adjusted.  Please request your Prim.MD to go over all Hospital Tests and Procedure/Radiological results at the follow up, please get all Hospital records sent to your Prim MD by signing hospital release before you go home.  If you experience worsening of your admission symptoms, develop shortness of breath, life threatening emergency, suicidal or homicidal thoughts you must seek medical attention immediately by calling 911 or calling your MD immediately  if symptoms less severe.  You Must read complete instructions/literature along with all the possible adverse reactions/side effects for all the Medicines you take and that have been prescribed to you. Take any new Medicines after you have completely understood and accpet all the possible adverse reactions/side effects.

## 2022-07-25 NOTE — TOC Transition Note (Signed)
Transition of Care [facility]) - CM/SW Discharge Note   Patient Details  Name: Nathan Rowland MRN: [id number] Date of Birth: 01-Apr-1980  Transition of Care [facility]) CM/SW Contact:  Gordy Clement, RN Phone Number: 07/25/2022, 10:39 AM   Clinical Narrative:     Patient to dc to home today- No needs          Patient Goals and CMS Choice      Discharge Placement                         Discharge Plan and Services Additional resources added to the After Visit Summary for                                       Social Determinants of Health (SDOH) Interventions SDOH Screenings   Food Insecurity: No Food Insecurity (07/11/2022)  Housing: Low Risk  (07/11/2022)  Transportation Needs: No Transportation Needs (07/11/2022)  Utilities: Not At Risk (07/11/2022)  Alcohol Screen: Low Risk  (05/25/2022)  Depression (PHQ2-9): Low Risk  (05/03/2022)  Financial Resource Strain: Patient Declined (05/25/2022)  Physical Activity: Unknown (05/25/2022)  Social Connections: Unknown (05/25/2022)  Stress: Patient Declined (05/25/2022)  Tobacco Use: Low Risk  (07/22/2022)     Readmission Risk Interventions     No data to display

## 2022-07-27 ENCOUNTER — Ambulatory Visit: Payer: BC Managed Care – PPO | Admitting: Family Medicine

## 2022-07-27 ENCOUNTER — Other Ambulatory Visit: Payer: Self-pay

## 2022-07-27 ENCOUNTER — Encounter (HOSPITAL_COMMUNITY): Payer: Self-pay

## 2022-07-27 ENCOUNTER — Emergency Department (HOSPITAL_COMMUNITY): Payer: BC Managed Care – PPO

## 2022-07-27 ENCOUNTER — Inpatient Hospital Stay (HOSPITAL_COMMUNITY)
Admission: EM | Admit: 2022-07-27 | Discharge: 2022-07-31 | DRG: 393 | Disposition: A | Discharge status: Home / Self Care | Payer: BC Managed Care – PPO | Attending: Internal Medicine | Admitting: Internal Medicine

## 2022-07-27 ENCOUNTER — Encounter: Payer: Self-pay | Admitting: Family Medicine

## 2022-07-27 VITALS — BP 116/62 | HR 102 | Temp 98.1°F | Ht 72.0 in | Wt 229.0 lb

## 2022-07-27 DIAGNOSIS — J189 Pneumonia, unspecified organism: Secondary | ICD-10-CM

## 2022-07-27 DIAGNOSIS — R6510 Systemic inflammatory response syndrome (SIRS) of non-infectious origin without acute organ dysfunction: Secondary | ICD-10-CM | POA: Diagnosis present

## 2022-07-27 DIAGNOSIS — Y838 Other surgical procedures as the cause of abnormal reaction of the patient, or of later complication, without mention of misadventure at the time of the procedure: Secondary | ICD-10-CM | POA: Diagnosis present

## 2022-07-27 DIAGNOSIS — Z825 Family history of asthma and other chronic lower respiratory diseases: Secondary | ICD-10-CM | POA: Diagnosis not present

## 2022-07-27 DIAGNOSIS — R112 Nausea with vomiting, unspecified: Secondary | ICD-10-CM

## 2022-07-27 DIAGNOSIS — R1110 Vomiting, unspecified: Secondary | ICD-10-CM | POA: Diagnosis not present

## 2022-07-27 DIAGNOSIS — Z833 Family history of diabetes mellitus: Secondary | ICD-10-CM | POA: Diagnosis not present

## 2022-07-27 DIAGNOSIS — J90 Pleural effusion, not elsewhere classified: Secondary | ICD-10-CM | POA: Diagnosis not present

## 2022-07-27 DIAGNOSIS — J168 Pneumonia due to other specified infectious organisms: Secondary | ICD-10-CM | POA: Diagnosis not present

## 2022-07-27 DIAGNOSIS — Z79899 Other long term (current) drug therapy: Secondary | ICD-10-CM

## 2022-07-27 DIAGNOSIS — E876 Hypokalemia: Secondary | ICD-10-CM | POA: Diagnosis present

## 2022-07-27 DIAGNOSIS — J9811 Atelectasis: Secondary | ICD-10-CM | POA: Diagnosis not present

## 2022-07-27 DIAGNOSIS — I864 Gastric varices: Secondary | ICD-10-CM | POA: Diagnosis present

## 2022-07-27 DIAGNOSIS — R109 Unspecified abdominal pain: Secondary | ICD-10-CM | POA: Diagnosis not present

## 2022-07-27 DIAGNOSIS — E860 Dehydration: Secondary | ICD-10-CM | POA: Diagnosis not present

## 2022-07-27 DIAGNOSIS — Z8719 Personal history of other diseases of the digestive system: Secondary | ICD-10-CM

## 2022-07-27 DIAGNOSIS — I8510 Secondary esophageal varices without bleeding: Secondary | ICD-10-CM | POA: Diagnosis not present

## 2022-07-27 DIAGNOSIS — K560 Paralytic ileus: Secondary | ICD-10-CM | POA: Diagnosis not present

## 2022-07-27 DIAGNOSIS — K7460 Unspecified cirrhosis of liver: Secondary | ICD-10-CM | POA: Diagnosis present

## 2022-07-27 DIAGNOSIS — I2693 Single subsegmental pulmonary embolism without acute cor pulmonale: Secondary | ICD-10-CM | POA: Diagnosis present

## 2022-07-27 DIAGNOSIS — K6389 Other specified diseases of intestine: Secondary | ICD-10-CM | POA: Diagnosis not present

## 2022-07-27 DIAGNOSIS — I10 Essential (primary) hypertension: Secondary | ICD-10-CM | POA: Diagnosis present

## 2022-07-27 DIAGNOSIS — Z6831 Body mass index (BMI) 31.0-31.9, adult: Secondary | ICD-10-CM | POA: Diagnosis not present

## 2022-07-27 DIAGNOSIS — Z7901 Long term (current) use of anticoagulants: Secondary | ICD-10-CM

## 2022-07-27 DIAGNOSIS — Z8249 Family history of ischemic heart disease and other diseases of the circulatory system: Secondary | ICD-10-CM | POA: Diagnosis not present

## 2022-07-27 DIAGNOSIS — Z823 Family history of stroke: Secondary | ICD-10-CM | POA: Diagnosis not present

## 2022-07-27 DIAGNOSIS — R1084 Generalized abdominal pain: Secondary | ICD-10-CM | POA: Diagnosis not present

## 2022-07-27 DIAGNOSIS — K766 Portal hypertension: Secondary | ICD-10-CM | POA: Diagnosis not present

## 2022-07-27 DIAGNOSIS — K9189 Other postprocedural complications and disorders of digestive system: Principal | ICD-10-CM | POA: Diagnosis present

## 2022-07-27 DIAGNOSIS — E669 Obesity, unspecified: Secondary | ICD-10-CM | POA: Diagnosis not present

## 2022-07-27 DIAGNOSIS — Z95828 Presence of other vascular implants and grafts: Secondary | ICD-10-CM

## 2022-07-27 DIAGNOSIS — J9601 Acute respiratory failure with hypoxia: Secondary | ICD-10-CM | POA: Diagnosis present

## 2022-07-27 DIAGNOSIS — R0602 Shortness of breath: Secondary | ICD-10-CM | POA: Diagnosis not present

## 2022-07-27 DIAGNOSIS — I2699 Other pulmonary embolism without acute cor pulmonale: Secondary | ICD-10-CM | POA: Diagnosis present

## 2022-07-27 DIAGNOSIS — R0489 Hemorrhage from other sites in respiratory passages: Secondary | ICD-10-CM | POA: Diagnosis present

## 2022-07-27 DIAGNOSIS — I8500 Esophageal varices without bleeding: Secondary | ICD-10-CM | POA: Diagnosis not present

## 2022-07-27 DIAGNOSIS — K567 Ileus, unspecified: Secondary | ICD-10-CM | POA: Diagnosis present

## 2022-07-27 DIAGNOSIS — R140 Abdominal distension (gaseous): Secondary | ICD-10-CM | POA: Diagnosis not present

## 2022-07-27 DIAGNOSIS — Z4659 Encounter for fitting and adjustment of other gastrointestinal appliance and device: Secondary | ICD-10-CM | POA: Diagnosis not present

## 2022-07-27 DIAGNOSIS — Z87442 Personal history of urinary calculi: Secondary | ICD-10-CM | POA: Diagnosis not present

## 2022-07-27 DIAGNOSIS — K5939 Other megacolon: Secondary | ICD-10-CM | POA: Diagnosis not present

## 2022-07-27 LAB — CBC
HCT: 34.1 % — ABNORMAL LOW (ref 39.0–52.0)
Hemoglobin: 10.4 g/dL — ABNORMAL LOW (ref 13.0–17.0)
MCH: 25.9 pg — ABNORMAL LOW (ref 26.0–34.0)
MCHC: 30.5 g/dL (ref 30.0–36.0)
MCV: 84.8 fL (ref 80.0–100.0)
Platelets: 337 10*3/uL (ref 150–400)
RBC: 4.02 MIL/uL — ABNORMAL LOW (ref 4.22–5.81)
RDW: 16.6 % — ABNORMAL HIGH (ref 11.5–15.5)
WBC: 14.2 10*3/uL — ABNORMAL HIGH (ref 4.0–10.5)
nRBC: 0 % (ref 0.0–0.2)

## 2022-07-27 LAB — URINALYSIS, ROUTINE W REFLEX MICROSCOPIC
Bilirubin Urine: NEGATIVE
Glucose, UA: NEGATIVE mg/dL
Hgb urine dipstick: NEGATIVE
Ketones, ur: NEGATIVE mg/dL
Leukocytes,Ua: NEGATIVE
Nitrite: NEGATIVE
Protein, ur: 30 mg/dL — AB
Specific Gravity, Urine: 1.026 (ref 1.005–1.030)
pH: 5 (ref 5.0–8.0)

## 2022-07-27 LAB — COMPREHENSIVE METABOLIC PANEL
ALT: 74 U/L — ABNORMAL HIGH (ref 0–44)
AST: 35 U/L (ref 15–41)
Albumin: 3.6 g/dL (ref 3.5–5.0)
Alkaline Phosphatase: 108 U/L (ref 38–126)
Anion gap: 8 (ref 5–15)
BUN: 6 mg/dL (ref 6–20)
CO2: 21 mmol/L — ABNORMAL LOW (ref 22–32)
Calcium: 9 mg/dL (ref 8.9–10.3)
Chloride: 106 mmol/L (ref 98–111)
Creatinine, Ser: 0.88 mg/dL (ref 0.61–1.24)
GFR, Estimated: >60 mL/min (ref >60)
Glucose, Bld: 119 mg/dL — ABNORMAL HIGH (ref 70–99)
Potassium: 4.1 mmol/L (ref 3.5–5.1)
Sodium: 135 mmol/L (ref 135–145)
Total Bilirubin: 0.7 mg/dL (ref 0.3–1.2)
Total Protein: 7.2 g/dL (ref 6.5–8.1)

## 2022-07-27 LAB — LACTIC ACID, PLASMA: Lactic Acid, Venous: 1.7 mmol/L (ref 0.5–1.9)

## 2022-07-27 LAB — LIPASE, BLOOD: Lipase: 33 U/L (ref 11–51)

## 2022-07-27 LAB — AMMONIA: Ammonia: 17 umol/L (ref 9–35)

## 2022-07-27 MED ORDER — DROPERIDOL 2.5 MG/ML IJ SOLN
1.2500 mg | Freq: Once | INTRAMUSCULAR | Status: AC
  Administered 2022-07-27: 1.25 mg via INTRAVENOUS
  Filled 2022-07-27: qty 2

## 2022-07-27 MED ORDER — SODIUM CHLORIDE 0.9 % IV SOLN: INTRAVENOUS | Status: AC

## 2022-07-27 MED ORDER — HYDROMORPHONE HCL 1 MG/ML IJ SOLN
1.0000 mg | Freq: Once | INTRAMUSCULAR | Status: AC
  Administered 2022-07-27: 1 mg via INTRAVENOUS
  Filled 2022-07-27: qty 1

## 2022-07-27 MED ORDER — HYDROMORPHONE HCL 1 MG/ML IJ SOLN: 1.0000 mg | Freq: Once | INTRAMUSCULAR | Status: DC

## 2022-07-27 MED ORDER — MORPHINE SULFATE (PF) 4 MG/ML IV SOLN: 4.0000 mg | Freq: Once | INTRAVENOUS | Status: DC

## 2022-07-27 MED ORDER — IOHEXOL 350 MG/ML SOLN
75.0000 mL | Freq: Once | INTRAVENOUS | Status: AC | PRN
  Administered 2022-07-27: 75 mL via INTRAVENOUS

## 2022-07-27 MED ORDER — HEPARIN (PORCINE) 25000 UT/250ML-% IV SOLN
2000.0000 [IU]/h | INTRAVENOUS | Status: DC
  Administered 2022-07-27: 1600 [IU]/h via INTRAVENOUS
  Administered 2022-07-29 – 2022-07-30 (×3): 2000 [IU]/h via INTRAVENOUS
  Filled 2022-07-27 (×5): qty 250

## 2022-07-27 MED ORDER — HYDROMORPHONE HCL 1 MG/ML IJ SOLN: 0.5000 mg | INTRAMUSCULAR | Status: DC

## 2022-07-27 MED ORDER — PIPERACILLIN-TAZOBACTAM 3.375 G IVPB
3.3750 g | Freq: Three times a day (TID) | INTRAVENOUS | Status: DC
  Administered 2022-07-27 – 2022-07-30 (×8): 3.375 g via INTRAVENOUS
  Filled 2022-07-27 (×8): qty 50

## 2022-07-27 MED ORDER — PANTOPRAZOLE SODIUM 40 MG IV SOLR
40.0000 mg | Freq: Two times a day (BID) | INTRAVENOUS | Status: DC
  Administered 2022-07-27 – 2022-07-29 (×4): 40 mg via INTRAVENOUS
  Filled 2022-07-27 (×4): qty 10

## 2022-07-27 MED ORDER — ONDANSETRON HCL 4 MG/2ML IJ SOLN
4.0000 mg | Freq: Once | INTRAMUSCULAR | Status: AC
  Administered 2022-07-27: 4 mg via INTRAVENOUS
  Filled 2022-07-27: qty 2

## 2022-07-27 MED ORDER — LACTULOSE 10 GM/15ML PO SOLN: 15.0000 g | Freq: Two times a day (BID) | ORAL | Status: DC | PRN

## 2022-07-27 MED ORDER — HYDROMORPHONE HCL 1 MG/ML IJ SOLN
1.0000 mg | INTRAMUSCULAR | Status: AC
  Administered 2022-07-27: 1 mg via INTRAVENOUS
  Filled 2022-07-27: qty 1

## 2022-07-27 MED ORDER — MORPHINE SULFATE (PF) 4 MG/ML IV SOLN
4.0000 mg | Freq: Once | INTRAVENOUS | Status: AC
  Administered 2022-07-27: 4 mg via INTRAVENOUS
  Filled 2022-07-27: qty 1

## 2022-07-27 MED ORDER — SODIUM CHLORIDE 0.9 % IV BOLUS
1000.0000 mL | Freq: Once | INTRAVENOUS | Status: AC
  Administered 2022-07-27: 1000 mL via INTRAVENOUS

## 2022-07-27 MED ORDER — HEPARIN BOLUS VIA INFUSION
2000.0000 [IU] | Freq: Once | INTRAVENOUS | Status: AC
  Administered 2022-07-27: 2000 [IU] via INTRAVENOUS
  Filled 2022-07-27: qty 2000

## 2022-07-27 NOTE — ED Notes (Signed)
ED TO INPATIENT HANDOFF REPORT  ED Nurse Name and Phone #: Airel Magadan/ [phone]  S Name/Age/Gender Nathan Rowland 42 y.o. male Room/Bed: OTFC/OTF  Code Status   Code Status: Full Code  Home/SNF/Other Home Patient oriented to: self, place, time, and situation Is this baseline? Yes   Triage Complete: Triage complete  Chief Complaint Abdominal pain [R10.9]  Triage Note Pt discharged Sunday after being admitted for GI bleed; TIPS procedure done; c/o generalized abd pain; endorses constipation from prescribed pain killers; c/o N/V that started today; taking laxitives, last BM this am; denies black or blood stools, denies coffee ground emesis; denies fevers   Allergies Allergies  Allergen Reactions   Pollen Extract      Allergy injection    Level of Care/Admitting Diagnosis ED Disposition     ED Disposition  Admit   Condition  --   Comment  Hospital Area: [facility] [[id number]]  Level of Care: Progressive [102]  Admit to Progressive based on following criteria: GI, ENDOCRINE disease patients with GI bleeding, acute liver failure or pancreatitis, stable with diabetic ketoacidosis or thyrotoxicosis (hypothyroid) state.  Admit to Progressive based on following criteria: MULTISYSTEM THREATS such as stable sepsis, metabolic/electrolyte imbalance with or without encephalopathy that is responding to early treatment.  May admit patient to [facility] or [facility] if equivalent level of care is available:: No  Covid Evaluation: Asymptomatic - no recent exposure (last 10 days) testing not required  Diagnosis: Abdominal pain [562130]  Admitting Physician: Darlin Drop [[id number]]  Attending Physician: Darlin Drop [[id number]]  Certification:: I certify this patient will need inpatient services for at least 2 midnights  Estimated Length of Stay: 2          B Medical/Surgery History Past Medical History:  Diagnosis Date   Allergy 11/29/1996   Finger fracture     Kidney calculus    Left knee pain    dx'd with L retropatellar knee pain   Past Surgical History:  Procedure Laterality Date   COLONOSCOPY WITH PROPOFOL N/A 07/14/2022   Procedure: COLONOSCOPY WITH PROPOFOL;  Surgeon: Sherrilyn Rist, MD;  Location: [facility] ENDOSCOPY;  Service: Gastroenterology;  Laterality: N/A;   ENTEROSCOPY N/A 07/12/2022   Procedure: ENTEROSCOPY;  Surgeon: Sherrilyn Rist, MD;  Location: [facility] ENDOSCOPY;  Service: Gastroenterology;  Laterality: N/A;   GIVENS CAPSULE STUDY N/A 07/12/2022   Procedure: GIVENS CAPSULE STUDY;  Surgeon: Sherrilyn Rist, MD;  Location: [facility] ENDOSCOPY;  Service: Gastroenterology;  Laterality: N/A;   IR EMBO ART  VEN HEMORR LYMPH EXTRAV  INC GUIDE ROADMAPPING  07/21/2022   IR RADIOLOGIST EVAL & MGMT  06/02/2022   IR TIPS  07/21/2022   IR US GUIDE VASC ACCESS LEFT  07/21/2022   IR US GUIDE VASC ACCESS RIGHT  07/21/2022   IR US GUIDE VASC ACCESS RIGHT  07/21/2022   RADIOLOGY WITH ANESTHESIA N/A 07/21/2022   Procedure: TIPS;  Surgeon: Bennie Dallas, MD;  Location: [facility] OR;  Service: Radiology;  Laterality: N/A;   SUBMUCOSAL TATTOO INJECTION  07/12/2022   Procedure: SUBMUCOSAL TATTOO INJECTION;  Surgeon: Sherrilyn Rist, MD;  Location: [facility] ENDOSCOPY;  Service: Gastroenterology;;     A IV Location/Drains/Wounds Patient Lines/Drains/Airways Status     Active Line/Drains/Airways     Name Placement date Placement time Site Days   Peripheral IV 07/27/22 20 G Right Antecubital 07/27/22  1658  Antecubital  less than 1   Peripheral IV 07/27/22 20 G  1" Anterior;Left Forearm 07/27/22  2332  Forearm  less than 1            Intake/Output Last 24 hours  Intake/Output Summary (Last 24 hours) at 07/27/2022 2345 Last data filed at 07/27/2022 1844 Gross per 24 hour  Intake 1000 ml  Output --  Net 1000 ml    Labs/Imaging Results for orders placed or performed during the hospital encounter of 07/27/22 (from the past 48 hour(s))  Lipase, blood      Status: None   Collection Time: 07/27/22  3:08 PM  Result Value Ref Range   Lipase 33 11 - 51 U/L    Comment: Performed at [facility], [address] [place], [place] [address]  Comprehensive metabolic panel     Status: Abnormal   Collection Time: 07/27/22  3:08 PM  Result Value Ref Range   Sodium 135 135 - 145 mmol/L   Potassium 4.1 3.5 - 5.1 mmol/L   Chloride 106 98 - 111 mmol/L   CO2 21 (L) 22 - 32 mmol/L   Glucose, Bld 119 (H) 70 - 99 mg/dL    Comment: Glucose reference range applies only to samples taken after fasting for at least 8 hours.   BUN 6 6 - 20 mg/dL   Creatinine, Ser 6.04 0.61 - 1.24 mg/dL   Calcium 9.0 8.9 - 54.0 mg/dL   Total Protein 7.2 6.5 - 8.1 g/dL   Albumin 3.6 3.5 - 5.0 g/dL   AST 35 15 - 41 U/L   ALT 74 (H) 0 - 44 U/L   Alkaline Phosphatase 108 38 - 126 U/L   Total Bilirubin 0.7 0.3 - 1.2 mg/dL   GFR, Estimated >98 >11 mL/min    Comment: (NOTE) Calculated using the CKD-EPI Creatinine Equation (2021)    Anion gap 8 5 - 15    Comment: Performed at [facility], [address] [place], [place] [address]  CBC     Status: Abnormal   Collection Time: 07/27/22  3:08 PM  Result Value Ref Range   WBC 14.2 (H) 4.0 - 10.5 K/uL   RBC 4.02 (L) 4.22 - 5.81 MIL/uL   Hemoglobin 10.4 (L) 13.0 - 17.0 g/dL   HCT 29.5 (L) 62.1 - 30.8 %   MCV 84.8 80.0 - 100.0 fL   MCH 25.9 (L) 26.0 - 34.0 pg   MCHC 30.5 30.0 - 36.0 g/dL   RDW 65.7 (H) 84.6 - 96.2 %   Platelets 337 150 - 400 K/uL   nRBC 0.0 0.0 - 0.2 %    Comment: Performed at [facility], [address], [place], [place] [address]  Ammonia     Status: None   Collection Time: 07/27/22  5:03 PM  Result Value Ref Range   Ammonia 17 9 - 35 umol/L    Comment: Performed at [facility], [address], [place], [place] [address]  Urinalysis, Routine w reflex microscopic -Urine, Clean Catch     Status: Abnormal   Collection Time: 07/27/22  7:40 PM  Result Value Ref Range   Color, Urine  YELLOW YELLOW   APPearance HAZY (A) CLEAR   Specific Gravity, Urine 1.026 1.005 - 1.030   pH 5.0 5.0 - 8.0   Glucose, UA NEGATIVE NEGATIVE mg/dL   Hgb urine dipstick NEGATIVE NEGATIVE   Bilirubin Urine NEGATIVE NEGATIVE   Ketones, ur NEGATIVE NEGATIVE mg/dL   Protein, ur 30 (A) NEGATIVE mg/dL   Nitrite NEGATIVE NEGATIVE  Leukocytes,Ua NEGATIVE NEGATIVE   RBC / HPF 0-5 0 - 5 RBC/hpf   WBC, UA 0-5 0 - 5 WBC/hpf   Bacteria, UA RARE (A) NONE SEEN   Squamous Epithelial / HPF 0-5 0 - 5 /HPF   Mucus PRESENT     Comment: Performed at [facility], [address] [place], [place] [address]  Lactic acid, plasma     Status: None   Collection Time: 07/27/22  9:55 PM  Result Value Ref Range   Lactic Acid, Venous 1.7 0.5 - 1.9 mmol/L    Comment: Performed at [facility], [address] [place], [place] [address]   CT ABDOMEN PELVIS W CONTRAST  Result Date: 07/27/2022 CLINICAL DATA:  Abdominal pain, vomiting EXAM: CT ABDOMEN AND PELVIS WITH CONTRAST TECHNIQUE: Multidetector CT imaging of the abdomen and pelvis was performed using the standard protocol following bolus administration of intravenous contrast. RADIATION DOSE REDUCTION: This exam was performed according to the departmental dose-optimization program which includes automated exposure control, adjustment of the mA and/or kV according to patient size and/or use of iterative reconstruction technique. CONTRAST:  75mL OMNIPAQUE IOHEXOL 350 MG/ML SOLN COMPARISON:  05/07/2022 FINDINGS: Lower chest: Incidentally noted partially occlusive thrombus in the left lower lobe pulmonary artery segmental branch. Bibasilar airspace opacities with air bronchograms could reflect atelectasis or pneumonia. No effusions. Hepatobiliary: tips stent in place. Numerous scattered subcentimeter hypodensities in the liver are stable, likely cysts. Gallbladder unremarkable. No biliary ductal dilatation. Pancreas: Coil embolization of varices in the pancreatic  region. No focal abnormality or ductal dilatation. Spleen: No focal abnormality.  Normal size. Adrenals/Urinary Tract: No adrenal abnormality. No focal renal abnormality. No stones or hydronephrosis. Urinary bladder is unremarkable. Stomach/Bowel: Normal appendix. The colon is moderately dilated to the sigmoid colon where there is gradual tapering. No visible obstructing process or mass lesion. Air-fluid levels noted in the dilated colon. Stomach and small bowel decompressed, unremarkable. Vascular/Lymphatic: No evidence of aneurysm or adenopathy. Reproductive: No visible focal abnormality. Other: No free fluid or free air. Musculoskeletal: No acute bony abnormality. IMPRESSION: Incidentally noted partially occlusive pulmonary embolus in the left lower lobe. Full extent/involvement could be evaluated with CTA chest. Bilateral lower lobe airspace opacities with air bronchograms could reflect atelectasis or pneumonia. Gaseous distention of the colon to the sigmoid colon with scattered air-fluid levels. No visible obstructing process. Findings may reflect adynamic ileus. Normal appendix. Prior tips and variceal coil embolization. Electronically Signed   By: Charlett Nose M.D.   On: 07/27/2022 21:11   DG Abdomen Acute W/Chest  Result Date: 07/27/2022 CLINICAL DATA:  Constipation EXAM: DG ABDOMEN ACUTE WITH 1 VIEW CHEST COMPARISON:  07/21/2022 FINDINGS: Diffusely gas-filled, distended colon, measuring up to 8.8 cm in caliber, with gas, stool, and fluid present to the rectum. No free air in the abdomen. TIPS projects over the right upper quadrant and coil material projects over the left hemiabdomen. Heart size and mediastinal contours are within normal limits. Both lungs are clear. IMPRESSION: Diffusely gas-filled, distended colon, measuring up to 8.8 cm in caliber, with gas, stool, and fluid present to the rectum. Findings are most consistent with ileus. Electronically Signed   By: Jearld Lesch M.D.   On: 07/27/2022  16:40    Pending Labs Unresulted Labs (From admission, onward)     Start     Ordered   07/28/22 0600  Heparin level (unfractionated)  Daily,   R      07/27/22 2311   07/28/22 0600  CBC  Daily,   R      07/27/22 2311   07/28/22 0500  Comprehensive metabolic panel  Tomorrow morning,   R        07/27/22 2318   07/28/22 0500  Magnesium  Tomorrow morning,   R        07/27/22 2318   07/28/22 0500  Phosphorus  Tomorrow morning,   R        07/27/22 2318   07/27/22 2117  Culture, blood (routine x 2)  BLOOD CULTURE X 2,   R (with STAT occurrences)      07/27/22 2116   07/27/22 2117  Lactic acid, plasma  Now then every 2 hours,   R (with STAT occurrences)      07/27/22 2116            Vitals/Pain Today's Vitals   07/27/22 2134 07/27/22 2324 07/27/22 2330 07/27/22 2339  BP:   131/82   Pulse:   (!) 122   Resp:   (!) 21   Temp:  99.4 F (37.4 C)    TempSrc:  Oral    SpO2:   90%   PainSc: 5    5     Isolation Precautions No active isolations  Medications Medications  piperacillin-tazobactam (ZOSYN) IVPB 3.375 g (3.375 g Intravenous New Bag/Given 07/27/22 2323)  pantoprazole (PROTONIX) injection 40 mg (40 mg Intravenous Given 07/27/22 2319)  heparin ADULT infusion 100 units/mL (25000 units/249mL) (1,600 Units/hr Intravenous New Bag/Given 07/27/22 2338)  0.9 %  sodium chloride infusion ( Intravenous New Bag/Given 07/27/22 2339)  sodium chloride 0.9 % bolus 1,000 mL (0 mLs Intravenous Stopped 07/27/22 1844)  ondansetron (ZOFRAN) injection 4 mg (4 mg Intravenous Given 07/27/22 1700)  morphine (PF) 4 MG/ML injection 4 mg (4 mg Intravenous Given 07/27/22 1847)  droperidol (INAPSINE) 2.5 MG/ML injection 1.25 mg (1.25 mg Intravenous Given 07/27/22 1922)  HYDROmorphone (DILAUDID) injection 1 mg (1 mg Intravenous Given 07/27/22 2043)  iohexol (OMNIPAQUE) 350 MG/ML injection 75 mL (75 mLs Intravenous Contrast Given 07/27/22 2048)  HYDROmorphone (DILAUDID) injection 1 mg (1 mg Intravenous Given  07/27/22 2313)  heparin bolus via infusion 2,000 Units (2,000 Units Intravenous Bolus from Bag 07/27/22 2334)    Mobility walks     Focused Assessments     R Recommendations: See Admitting Provider Note  Report given to:   Additional Notes: Pt came in for abd pain, n/v.

## 2022-07-27 NOTE — ED Provider Notes (Signed)
[place] [facility] AT [facility] Provider Note   CSN: [id number] Arrival date & time: 07/27/22  1458     History  Chief Complaint  Patient presents with   Abdominal Pain   Emesis    Nathan Rowland is a 42 y.o. male.  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  He underwent a TIPS procedure and embolization on 5/22.  He had several units of prbcs during hospital stay.  Pt has been having a lot of abd pain and n/v/d since d/c on 5/26.  Pt said stool is not black.            Home Medications Prior to Admission medications   Medication Sig Start Date End Date Taking? Authorizing Provider  EPINEPHrine 0.3 mg/0.3 mL IJ SOAJ injection Inject 0.3 mg into the muscle once as needed for anaphylaxis.    [provider]  folic acid (FOLVITE) 1 MG tablet Take 1 tablet (1 mg total) by mouth daily. 07/25/22   Leroy Sea, MD  HYDROcodone-acetaminophen (NORCO/VICODIN) 5-325 MG tablet Take 1 tablet by mouth every 8 (eight) hours as needed for moderate pain. 07/25/22   Leroy Sea, MD  Iron, Ferrous Sulfate, 325 (65 Fe) MG TABS Take 325 mg by mouth daily. 07/25/22   Leroy Sea, MD  lactulose (CHRONULAC) 10 GM/15ML solution Take 22.5 mLs (15 g total) by mouth 2 (two) times daily as needed for mild constipation. 07/27/22 07/22/23  Joaquim Nam, MD  midodrine (PROAMATINE) 10 MG tablet Take 1 tablet (10 mg total) by mouth 2 (two) times daily with a meal. 07/25/22   Leroy Sea, MD      Allergies    Pollen extract    Review of Systems   Review of Systems  Gastrointestinal:  Positive for abdominal pain, diarrhea, nausea and vomiting.  All other systems reviewed and are negative.   Physical Exam Updated Vital Signs BP (!)  140/84   Pulse (!) 125   Temp 98.9 F (37.2 C) (Oral)   Resp (!) 35   SpO2 91%  Physical Exam Vitals and nursing note reviewed.  Constitutional:      Appearance: He is well-developed. He is ill-appearing and diaphoretic.  HENT:     Head: Normocephalic and atraumatic.     Mouth/Throat:     Mouth: Mucous membranes are dry.  Eyes:     Extraocular Movements: Extraocular movements intact.     Pupils: Pupils are equal, round, and reactive to light.  Cardiovascular:     Rate and Rhythm: Normal rate and regular rhythm.     Heart sounds: Normal heart sounds.  Pulmonary:     Effort: Pulmonary effort is normal.     Breath sounds: Normal breath sounds.  Abdominal:     General: Abdomen is flat.     Palpations: Abdomen is soft.     Tenderness: There is generalized abdominal tenderness.  Skin:    General: Skin is warm.     Capillary Refill: Capillary refill takes less than 2 seconds.  Neurological:     General: No focal deficit present.     Mental Status: He is alert and oriented to person, place, and time.  Psychiatric:        Mood and Affect: Mood normal.  Behavior: Behavior normal.     ED Results / Procedures / Treatments   Labs (all labs ordered are listed, but only abnormal results are displayed) Labs Reviewed  COMPREHENSIVE METABOLIC PANEL - Abnormal; Notable for the following components:      Result Value   CO2 21 (*)    Glucose, Bld 119 (*)    ALT 74 (*)    All other components within normal limits  CBC - Abnormal; Notable for the following components:   WBC 14.2 (*)    RBC 4.02 (*)    Hemoglobin 10.4 (*)    HCT 34.1 (*)    MCH 25.9 (*)    RDW 16.6 (*)    All other components within normal limits  URINALYSIS, ROUTINE W REFLEX MICROSCOPIC - Abnormal; Notable for the following components:   APPearance HAZY (*)    Protein, ur 30 (*)    Bacteria, UA RARE (*)    All other components within normal limits  CULTURE, BLOOD (ROUTINE X 2)  CULTURE, BLOOD (ROUTINE X  2)  LIPASE, BLOOD  AMMONIA  LACTIC ACID, PLASMA  LACTIC ACID, PLASMA    EKG EKG Interpretation  Date/Time:  Tuesday Jul 27 2022 17:01:32 EDT Ventricular Rate:  98 PR Interval:  131 QRS Duration: 90 QT Interval:  346 QTC Calculation: 442 R Axis:   42 Text Interpretation: Sinus rhythm Since last tracing rate slower Confirmed by Jacalyn Lefevre [phone]) on 07/27/2022 5:59:23 PM  Radiology CT ABDOMEN PELVIS W CONTRAST  Result Date: 07/27/2022 CLINICAL DATA:  Abdominal pain, vomiting EXAM: CT ABDOMEN AND PELVIS WITH CONTRAST TECHNIQUE: Multidetector CT imaging of the abdomen and pelvis was performed using the standard protocol following bolus administration of intravenous contrast. RADIATION DOSE REDUCTION: This exam was performed according to the departmental dose-optimization program which includes automated exposure control, adjustment of the mA and/or kV according to patient size and/or use of iterative reconstruction technique. CONTRAST:  75mL OMNIPAQUE IOHEXOL 350 MG/ML SOLN COMPARISON:  05/07/2022 FINDINGS: Lower chest: Incidentally noted partially occlusive thrombus in the left lower lobe pulmonary artery segmental branch. Bibasilar airspace opacities with air bronchograms could reflect atelectasis or pneumonia. No effusions. Hepatobiliary: tips stent in place. Numerous scattered subcentimeter hypodensities in the liver are stable, likely cysts. Gallbladder unremarkable. No biliary ductal dilatation. Pancreas: Coil embolization of varices in the pancreatic region. No focal abnormality or ductal dilatation. Spleen: No focal abnormality.  Normal size. Adrenals/Urinary Tract: No adrenal abnormality. No focal renal abnormality. No stones or hydronephrosis. Urinary bladder is unremarkable. Stomach/Bowel: Normal appendix. The colon is moderately dilated to the sigmoid colon where there is gradual tapering. No visible obstructing process or mass lesion. Air-fluid levels noted in the dilated colon.  Stomach and small bowel decompressed, unremarkable. Vascular/Lymphatic: No evidence of aneurysm or adenopathy. Reproductive: No visible focal abnormality. Other: No free fluid or free air. Musculoskeletal: No acute bony abnormality. IMPRESSION: Incidentally noted partially occlusive pulmonary embolus in the left lower lobe. Full extent/involvement could be evaluated with CTA chest. Bilateral lower lobe airspace opacities with air bronchograms could reflect atelectasis or pneumonia. Gaseous distention of the colon to the sigmoid colon with scattered air-fluid levels. No visible obstructing process. Findings may reflect adynamic ileus. Normal appendix. Prior tips and variceal coil embolization. Electronically Signed   By: Charlett Nose M.D.   On: 07/27/2022 21:11   DG Abdomen Acute W/Chest  Result Date: 07/27/2022 CLINICAL DATA:  Constipation EXAM: DG ABDOMEN ACUTE WITH 1 VIEW CHEST COMPARISON:  07/21/2022 FINDINGS: Diffusely gas-filled, distended colon,  measuring up to 8.8 cm in caliber, with gas, stool, and fluid present to the rectum. No free air in the abdomen. TIPS projects over the right upper quadrant and coil material projects over the left hemiabdomen. Heart size and mediastinal contours are within normal limits. Both lungs are clear. IMPRESSION: Diffusely gas-filled, distended colon, measuring up to 8.8 cm in caliber, with gas, stool, and fluid present to the rectum. Findings are most consistent with ileus. Electronically Signed   By: Jearld Lesch M.D.   On: 07/27/2022 16:40    Procedures Procedures    Medications Ordered in ED Medications  HYDROmorphone (DILAUDID) injection 1 mg (has no administration in time range)  HYDROmorphone (DILAUDID) injection 0.5 mg (has no administration in time range)  sodium chloride 0.9 % bolus 1,000 mL (0 mLs Intravenous Stopped 07/27/22 1844)  ondansetron (ZOFRAN) injection 4 mg (4 mg Intravenous Given 07/27/22 1700)  morphine (PF) 4 MG/ML injection 4 mg (4 mg  Intravenous Given 07/27/22 1847)  droperidol (INAPSINE) 2.5 MG/ML injection 1.25 mg (1.25 mg Intravenous Given 07/27/22 1922)  HYDROmorphone (DILAUDID) injection 1 mg (1 mg Intravenous Given 07/27/22 2043)  iohexol (OMNIPAQUE) 350 MG/ML injection 75 mL (75 mLs Intravenous Contrast Given 07/27/22 2048)    ED Course/ Medical Decision Making/ A&P                             Medical Decision Making Amount and/or Complexity of Data Reviewed Labs: ordered. Radiology: ordered.  Risk Prescription drug management. Decision regarding hospitalization.   This patient presents to the ED for concern of abd pain and n/v, this involves an extensive number of treatment options, and is a complaint that carries with it a high risk of complications and morbidity.  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  The pertinent results include:  wbc elevated at 14.2, hgb 10.4 (up from 8.2 on 5/26), cmp nl other than alt elevated at 74, lip nl   Imaging Studies ordered:  I ordered imaging studies including kub3, ct abd/pelvis  I independently visualized and interpreted imaging which showed  KUB: Diffusely gas-filled, distended colon, measuring up to 8.8 cm in  caliber, with gas, stool, and fluid present to the rectum. Findings  are most consistent with ileus.  CT abd/pelvis: Incidentally noted partially occlusive pulmonary embolus in the left  lower lobe. Full extent/involvement could be evaluated with CTA  chest.    Bilateral  lower lobe airspace opacities with air bronchograms could  reflect atelectasis or pneumonia.    Gaseous distention of the colon to the sigmoid colon with scattered  air-fluid levels. No visible obstructing process. Findings may  reflect adynamic ileus.    Normal appendix.    Prior tips and variceal coil embolization.   I agree with the radiologist interpretation   Cardiac Monitoring:  The patient was maintained on a cardiac monitor.  I personally viewed and interpreted the cardiac monitored which showed an underlying rhythm of: st   Medicines ordered and prescription drug management:  I ordered medication including ivfs, zofran, inapsine, morphine, dilaudid  for sx  Reevaluation of the patient after these medicines showed that the patient improved I have reviewed the patients home medicines and have made adjustments as needed   Test Considered:  ct   Critical Interventions:  meds   Consultations Obtained:  I requested consultation with the hospitalist (Dr. Margo Aye),  and discussed lab and imaging findings as well as pertinent plan - she will admit   Problem List / ED Course:  N/v/d and abd pain:  ileus.  No obstruction noted ? Bilateral pna and LLL PE:  abx started.  It looks like he had a PE noted on CT scan while he was in the hospital, but decision made to not anticoagulate due to his recent severe GI bleed.  I will hold off on anticoagulation now. Hx GIB:  hgb improved.  No active bleeding now.   Reevaluation:  After the interventions noted above, I reevaluated the patient and found that they have :improved   Social Determinants of Health:  Lives at home   Dispostion:  After consideration of the diagnostic results and the patients response to treatment, I feel that the patent would benefit from admission.          Final Clinical Impression(s) / ED Diagnoses Final diagnoses:  Dehydration  Nausea vomiting and diarrhea  Generalized abdominal pain   Ileus (HCC)  Single subsegmental pulmonary embolism without acute cor pulmonale (HCC)  Pneumonia of both lower lobes due to infectious organism  History of GI bleed    Rx / DC Orders ED Discharge Orders     None         Jacalyn Lefevre, MD 07/27/22 2215

## 2022-07-27 NOTE — ED Triage Notes (Signed)
Pt discharged Sunday after being admitted for GI bleed; TIPS procedure done; c/o generalized abd pain; endorses constipation from prescribed pain killers; c/o N/V that started today; taking laxitives, last BM this am; denies black or blood stools, denies coffee ground emesis; denies fevers

## 2022-07-27 NOTE — Patient Instructions (Signed)
Try skipping 2nd dose of lactulose of today.  Then try restarting 1/2 dose after that.   Let me know how that goes.  You can always increase the dose if needed, back to full dose up to 3 times a day.  Take care.  Glad to see you.

## 2022-07-27 NOTE — Progress Notes (Unsigned)
Inpatient f/u with prev TIPS noted.  H/o likely aspiration PNA.  Constipation, already taking lactulose.  Taking vicodin prn for pain. Off anticoagulation.  H/o ABLA with prev GI eval and transfusion.   R>L sided chest wall pain but better today than yesterday.  BM this AM.  No black or bloody stools.  He is cutting back on vicodin use- last dose was yesterday.  He is having mult BMs per day, with 1-2 doses of lactulose per day.  Never got to TID dosing of lactulose.    Per patient prev pale skin color and jaundice improved.   Has hematology f/u pending.  Defer labs to hematology tomorrow.  I sent a note to Dr. Leonides Schanz about his upcoming scheduled iron infusion.    No fevers now.    Meds, vitals, and allergies reviewed.   ROS: Per HPI unless specifically indicated in ROS section   Nad [place] Neck supple, no LA Rrr ctab No asterixis on exam.   Abd mildly protuberant but not ttp.  Audible BS w/o auscultation.  No rebound.   No jaundice.  TIPS sites healing.   No BLE edema.

## 2022-07-27 NOTE — ED Notes (Signed)
Pt gone to Xray 

## 2022-07-27 NOTE — Progress Notes (Signed)
ANTICOAGULATION CONSULT NOTE - Initial Consult  Pharmacy Consult for heparin Indication: pulmonary embolus  Allergies  Allergen Reactions   Pollen Extract      Allergy injection    Patient Measurements:   Heparin Dosing Weight: 99.1 kg  Vital Signs: Temp: 98.9 F (37.2 C) (05/28 1942) Temp Source: Oral (05/28 1942) BP: 140/84 (05/28 2030) Pulse Rate: 125 (05/28 2030)  Labs: Recent Labs    07/25/22 0519 07/27/22 1508  HGB 8.2* 10.4*  HCT 26.1* 34.1*  PLT 187 337  CREATININE 0.88 0.88    Estimated Creatinine Clearance: 140.4 mL/min (by C-G formula based on SCr of 0.88 mg/dL).   Medical History: Past Medical History:  Diagnosis Date   Allergy 11/29/1996   Finger fracture    Kidney calculus    Left knee pain    dx'd with L retropatellar knee pain    Medications:  (Not in a hospital admission)  Scheduled:    HYDROmorphone (DILAUDID) injection  1 mg Intravenous STAT   pantoprazole (PROTONIX) IV  40 mg Intravenous BID   Infusions:   piperacillin-tazobactam      Assessment: Patient presented with nausea and vomiting. Recent admission for GI bleeding with no bleeding in stool on admission. CTA PE on 07/11/21 read as indeterminate for small PE with no evidence of RHS, however the decision was made during that admission not to anticoagulate given recent GI bleeding. Patient was previously taking Eliquis for portal vein thrombosis, however this has been held for ~2 weeks for GI bleeding. New CTA PE ordered.  Hgb 10.4, HCT 34.1, and PLTc 337K. No need for aPTT at this time given no recent DOAC per patient report.  Goal of Therapy:  Heparin level 0.3-0.7 units/ml Monitor platelets by anticoagulation protocol: Yes   Plan:  Will give smaller heparin bolus of 2000 units with recent GI bleed Start heparin infusion at 1600 units/hr Heparin level at 0600 Daily heparin level and CBC  Thank you for involving pharmacy in this patient's care.  Enos Fling, PharmD PGY2  Pharmacy Resident 07/27/2022 11:12 PM

## 2022-07-27 NOTE — ED Notes (Signed)
Pt taken to CT with this RN.

## 2022-07-28 ENCOUNTER — Inpatient Hospital Stay: Payer: BC Managed Care – PPO

## 2022-07-28 ENCOUNTER — Inpatient Hospital Stay (HOSPITAL_COMMUNITY): Payer: BC Managed Care – PPO

## 2022-07-28 ENCOUNTER — Other Ambulatory Visit: Payer: Self-pay | Admitting: Hematology and Oncology

## 2022-07-28 DIAGNOSIS — R1084 Generalized abdominal pain: Secondary | ICD-10-CM | POA: Diagnosis not present

## 2022-07-28 DIAGNOSIS — R0602 Shortness of breath: Secondary | ICD-10-CM | POA: Diagnosis not present

## 2022-07-28 DIAGNOSIS — R109 Unspecified abdominal pain: Secondary | ICD-10-CM

## 2022-07-28 DIAGNOSIS — R140 Abdominal distension (gaseous): Secondary | ICD-10-CM | POA: Diagnosis not present

## 2022-07-28 DIAGNOSIS — Z95828 Presence of other vascular implants and grafts: Secondary | ICD-10-CM

## 2022-07-28 DIAGNOSIS — R1110 Vomiting, unspecified: Secondary | ICD-10-CM | POA: Diagnosis not present

## 2022-07-28 DIAGNOSIS — K567 Ileus, unspecified: Secondary | ICD-10-CM

## 2022-07-28 DIAGNOSIS — I2699 Other pulmonary embolism without acute cor pulmonale: Secondary | ICD-10-CM | POA: Diagnosis not present

## 2022-07-28 DIAGNOSIS — R6510 Systemic inflammatory response syndrome (SIRS) of non-infectious origin without acute organ dysfunction: Secondary | ICD-10-CM

## 2022-07-28 DIAGNOSIS — Z7901 Long term (current) use of anticoagulants: Secondary | ICD-10-CM

## 2022-07-28 DIAGNOSIS — I8500 Esophageal varices without bleeding: Secondary | ICD-10-CM

## 2022-07-28 DIAGNOSIS — K766 Portal hypertension: Secondary | ICD-10-CM

## 2022-07-28 DIAGNOSIS — Z4659 Encounter for fitting and adjustment of other gastrointestinal appliance and device: Secondary | ICD-10-CM | POA: Diagnosis not present

## 2022-07-28 DIAGNOSIS — D509 Iron deficiency anemia, unspecified: Secondary | ICD-10-CM

## 2022-07-28 DIAGNOSIS — J9601 Acute respiratory failure with hypoxia: Secondary | ICD-10-CM | POA: Diagnosis not present

## 2022-07-28 DIAGNOSIS — Z8719 Personal history of other diseases of the digestive system: Secondary | ICD-10-CM

## 2022-07-28 DIAGNOSIS — J9811 Atelectasis: Secondary | ICD-10-CM | POA: Diagnosis not present

## 2022-07-28 DIAGNOSIS — K560 Paralytic ileus: Secondary | ICD-10-CM | POA: Diagnosis not present

## 2022-07-28 DIAGNOSIS — J90 Pleural effusion, not elsewhere classified: Secondary | ICD-10-CM | POA: Diagnosis not present

## 2022-07-28 LAB — ECHOCARDIOGRAM COMPLETE
AR max vel: 3.87 cm2
AV Area VTI: 4.06 cm2
AV Area mean vel: 3.93 cm2
AV Mean grad: 4.5 mmHg
AV Peak grad: 8.1 mmHg
Ao pk vel: 1.42 m/s
Area-P 1/2: 5.27 cm2
Calc EF: 58.7 %
Height: 72 in
MV VTI: 5.23 cm2
S' Lateral: 3.6 cm
Single Plane A2C EF: 57.5 %
Single Plane A4C EF: 58.9 %
Weight: 3664 oz

## 2022-07-28 LAB — HEPARIN LEVEL (UNFRACTIONATED)
Heparin Unfractionated: 0.24 IU/mL — ABNORMAL LOW (ref 0.30–0.70)
Heparin Unfractionated: 0.21 IU/mL — ABNORMAL LOW (ref 0.30–0.70)
Heparin Unfractionated: 0.47 IU/mL (ref 0.30–0.70)

## 2022-07-28 LAB — CBC
HCT: 29.3 % — ABNORMAL LOW (ref 39.0–52.0)
Hemoglobin: 9.2 g/dL — ABNORMAL LOW (ref 13.0–17.0)
MCH: 25.5 pg — ABNORMAL LOW (ref 26.0–34.0)
MCHC: 31.4 g/dL (ref 30.0–36.0)
MCV: 81.2 fL (ref 80.0–100.0)
Platelets: 323 10*3/uL (ref 150–400)
RBC: 3.61 MIL/uL — ABNORMAL LOW (ref 4.22–5.81)
RDW: 16.9 % — ABNORMAL HIGH (ref 11.5–15.5)
WBC: 24.6 10*3/uL — ABNORMAL HIGH (ref 4.0–10.5)
nRBC: 0 % (ref 0.0–0.2)

## 2022-07-28 LAB — COMPREHENSIVE METABOLIC PANEL
ALT: 61 U/L — ABNORMAL HIGH (ref 0–44)
AST: 24 U/L (ref 15–41)
Albumin: 3.2 g/dL — ABNORMAL LOW (ref 3.5–5.0)
Alkaline Phosphatase: 96 U/L (ref 38–126)
Anion gap: 9 (ref 5–15)
BUN: 8 mg/dL (ref 6–20)
CO2: 21 mmol/L — ABNORMAL LOW (ref 22–32)
Calcium: 8.6 mg/dL — ABNORMAL LOW (ref 8.9–10.3)
Chloride: 105 mmol/L (ref 98–111)
Creatinine, Ser: 0.86 mg/dL (ref 0.61–1.24)
GFR, Estimated: >60 mL/min (ref >60)
Glucose, Bld: 138 mg/dL — ABNORMAL HIGH (ref 70–99)
Potassium: 4.1 mmol/L (ref 3.5–5.1)
Sodium: 135 mmol/L (ref 135–145)
Total Bilirubin: 0.6 mg/dL (ref 0.3–1.2)
Total Protein: 6.5 g/dL (ref 6.5–8.1)

## 2022-07-28 LAB — CULTURE, BLOOD (ROUTINE X 2)

## 2022-07-28 LAB — PHOSPHORUS: Phosphorus: 3.5 mg/dL (ref 2.5–4.6)

## 2022-07-28 LAB — LACTIC ACID, PLASMA: Lactic Acid, Venous: 1.8 mmol/L (ref 0.5–1.9)

## 2022-07-28 LAB — MAGNESIUM: Magnesium: 2.1 mg/dL (ref 1.7–2.4)

## 2022-07-28 MED ORDER — NALOXONE HCL 0.4 MG/ML IJ SOLN: 0.4000 mg | INTRAMUSCULAR | Status: DC | PRN

## 2022-07-28 MED ORDER — HYDROMORPHONE HCL 1 MG/ML IJ SOLN: 0.5000 mg | INTRAMUSCULAR | Status: DC | PRN

## 2022-07-28 MED ORDER — PROCHLORPERAZINE EDISYLATE 10 MG/2ML IJ SOLN
5.0000 mg | Freq: Four times a day (QID) | INTRAMUSCULAR | Status: DC | PRN
  Administered 2022-07-28: 5 mg via INTRAVENOUS
  Filled 2022-07-28: qty 2

## 2022-07-28 MED ORDER — PHENOL 1.4 % MT LIQD
1.0000 | OROMUCOSAL | Status: DC | PRN
  Administered 2022-07-28: 1 via OROMUCOSAL
  Filled 2022-07-28: qty 177

## 2022-07-28 MED ORDER — HYDROMORPHONE HCL 1 MG/ML IJ SOLN
1.0000 mg | INTRAMUSCULAR | Status: DC | PRN
  Administered 2022-07-28: 1 mg via INTRAVENOUS
  Filled 2022-07-28: qty 1

## 2022-07-28 MED ORDER — IOHEXOL 350 MG/ML SOLN
75.0000 mL | Freq: Once | INTRAVENOUS | Status: AC | PRN
  Administered 2022-07-28: 75 mL via INTRAVENOUS

## 2022-07-28 MED ORDER — HYDROMORPHONE HCL 1 MG/ML IJ SOLN
1.0000 mg | INTRAMUSCULAR | Status: AC | PRN
  Administered 2022-07-28 (×2): 1 mg via INTRAVENOUS
  Filled 2022-07-28 (×2): qty 1

## 2022-07-28 MED ORDER — HEPARIN BOLUS VIA INFUSION
1500.0000 [IU] | Freq: Once | INTRAVENOUS | Status: AC
  Administered 2022-07-28: 1500 [IU] via INTRAVENOUS
  Filled 2022-07-28: qty 1500

## 2022-07-28 NOTE — H&P (Addendum)
History and Physical  [name]:[id number] DOB: Nov 08, 1980 DOA: 07/27/2022  Referring physician: Dr. Particia Nearing, EDP  PCP: Joaquim Nam, MD  Outpatient Specialists: GI, IR. Patient coming from: Home.  Chief Complaint: Abdominal pain.  HPI: Nathan Rowland is a 42 y.o. male with medical history significant for noncirrhotic portal hypertension, esophageal varices, portal vein thrombosis previously on anticoagulation Eliquis, who was recently admitted for recurrent GI bleed and acute blood loss anemia with hemoglobin of 4, received several units of PRBCs.  He underwent a TIPS procedure and embolization by IR on 07/21/2022 and was discharged to home 2 days prior to this presentation.  Endorses progressive diffuse severe abdominal pain for the past 2 days.  Associated with nausea vomiting and diarrhea.  Denies any melena or hematochezia.  Denies any hematemesis or coffee-ground emesis.  No fevers or chills reported.  His last bowel movement was this morning, it was watery with some chunks of stool in it.  Admits to flatulence but minimally.  In the ED, requiring frequent IV opioid-based analgesics due to severe abdominal pain.  Abdominal x-ray with finding consistent with ileus.  This was confirmed with abdominal CT scan with contrast which also revealed incidentally noted partially occlusive pulmonary embolus in the left lower lobe.  A CT angio chest was ordered and is pending.  Lab studies was notable for leukocytosis 14.2 K up trended to 24.6 K.  Tachycardic and tachypneic.  TRH was asked to admit for further evaluation and management.   ED Course: Temperature 97.6.  BP 133/78, pulse 123, respiration rate 23.  Lab studies remarkable for WBC 14.2, repeat 24.6, hemoglobin 10.4, repeat 9.2, platelet count 323.  Serum bicarb 21, glucose 138, albumin 3.2, ALT 61.  Review of Systems: Review of systems as noted in the HPI. All other systems reviewed and are negative.   Past Medical  History:  Diagnosis Date   Allergy 11/29/1996   Finger fracture    Kidney calculus    Left knee pain    dx'd with L retropatellar knee pain   Past Surgical History:  Procedure Laterality Date   COLONOSCOPY WITH PROPOFOL N/A 07/14/2022   Procedure: COLONOSCOPY WITH PROPOFOL;  Surgeon: Sherrilyn Rist, MD;  Location: [facility] ENDOSCOPY;  Service: Gastroenterology;  Laterality: N/A;   ENTEROSCOPY N/A 07/12/2022   Procedure: ENTEROSCOPY;  Surgeon: Sherrilyn Rist, MD;  Location: [facility] ENDOSCOPY;  Service: Gastroenterology;  Laterality: N/A;   GIVENS CAPSULE STUDY N/A 07/12/2022   Procedure: GIVENS CAPSULE STUDY;  Surgeon: Sherrilyn Rist, MD;  Location: [facility] ENDOSCOPY;  Service: Gastroenterology;  Laterality: N/A;   IR EMBO ART  VEN HEMORR LYMPH EXTRAV  INC GUIDE ROADMAPPING  07/21/2022   IR RADIOLOGIST EVAL & MGMT  06/02/2022   IR TIPS  07/21/2022   IR US GUIDE VASC ACCESS LEFT  07/21/2022   IR US GUIDE VASC ACCESS RIGHT  07/21/2022   IR US GUIDE VASC ACCESS RIGHT  07/21/2022   RADIOLOGY WITH ANESTHESIA N/A 07/21/2022   Procedure: TIPS;  Surgeon: Bennie Dallas, MD;  Location: [facility] OR;  Service: Radiology;  Laterality: N/A;   SUBMUCOSAL TATTOO INJECTION  07/12/2022   Procedure: SUBMUCOSAL TATTOO INJECTION;  Surgeon: Sherrilyn Rist, MD;  Location: [facility] ENDOSCOPY;  Service: Gastroenterology;;    Social History:  reports that he has never smoked. He has been exposed to tobacco smoke. He has never used smokeless tobacco. He reports current alcohol use. He reports that he does not use  drugs.   Allergies  Allergen Reactions   Pollen Extract      Allergy injection    Family History  Problem Relation Age of Onset   Diabetes Other        3 great aunts, adult onset   Cancer Other        Uterine   Heart disease Maternal Grandmother        MI   Stroke Maternal Grandmother    Cancer Maternal Grandfather        Lung   Cancer Paternal Grandmother        Skin   Emphysema Paternal Grandfather     Hypertension Neg Hx    Depression Neg Hx    Alcohol abuse Neg Hx    Drug abuse Neg Hx    Prostate cancer Neg Hx    Colon cancer Neg Hx       Prior to Admission medications   Medication Sig Start Date End Date Taking? Authorizing Provider  folic acid (FOLVITE) 1 MG tablet Take 1 tablet (1 mg total) by mouth daily. 07/25/22  Yes Leroy Sea, MD  HYDROcodone-acetaminophen (NORCO/VICODIN) 5-325 MG tablet Take 1 tablet by mouth every 8 (eight) hours as needed for moderate pain. 07/25/22  Yes Leroy Sea, MD  Iron, Ferrous Sulfate, 325 (65 Fe) MG TABS Take 325 mg by mouth daily. 07/25/22  Yes Leroy Sea, MD  lactulose (CHRONULAC) 10 GM/15ML solution Take 22.5 mLs (15 g total) by mouth 2 (two) times daily as needed for mild constipation. 07/27/22 07/22/23 Yes Joaquim Nam, MD  EPINEPHrine 0.3 mg/0.3 mL IJ SOAJ injection Inject 0.3 mg into the muscle once as needed for anaphylaxis. Patient not taking: Reported on 07/27/2022    [provider]  midodrine (PROAMATINE) 10 MG tablet Take 1 tablet (10 mg total) by mouth 2 (two) times daily with a meal. Patient not taking: Reported on 07/27/2022 07/25/22   Leroy Sea, MD    Physical Exam: BP 130/79 (BP Location: Right Arm)   Pulse (!) 117   Temp 98 F (36.7 C) (Oral)   Resp 19   SpO2 92%   General: 42 y.o. year-old male well developed well nourished in no acute distress.  Alert and oriented x3. Cardiovascular: Regular rate and rhythm with no rubs or gallops.  No thyromegaly or JVD noted.  No lower extremity edema. 2/4 pulses in all 4 extremities. Respiratory: Faint rales at bases with no wheezing noted. Good inspiratory effort. Abdomen: Mildly distended, diffusely tender with hypoactive bowel sounds. Muskuloskeletal: No cyanosis, clubbing or edema noted bilaterally Neuro: CN II-XII intact, strength, sensation, reflexes Skin: No ulcerative lesions noted or rashes Psychiatry: Judgement and insight appear normal.  Mood is appropriate for condition and setting          Labs on Admission:  Basic Metabolic Panel: Recent Labs  Lab 07/23/22 1224 07/24/22 0457 07/25/22 0519 07/27/22 1508 07/28/22 0232  NA 138 137 138 135 135  K 3.5 3.5 3.6 4.1 4.1  CL 106 108 107 106 105  CO2 24 22 22  21* 21*  GLUCOSE 107* 107* 105* 119* 138*  BUN 10 8 9 6 8   CREATININE 0.80 0.78 0.88 0.88 0.86  CALCIUM 8.4* 8.2* 8.1* 9.0 8.6*  MG 1.9  --   --   --  2.1  PHOS  --   --   --   --  3.5   Liver Function Tests: Recent Labs  Lab 07/27/22 1508 07/28/22 0232  AST 35 24  ALT 74* 61*  ALKPHOS 108 96  BILITOT 0.7 0.6  PROT 7.2 6.5  ALBUMIN 3.6 3.2*   Recent Labs  Lab 07/27/22 1508  LIPASE 33   Recent Labs  Lab 07/22/22 0542 07/23/22 1224 07/24/22 0457 07/25/22 0519 07/27/22 1703  AMMONIA 25 36* 38* 25 17   CBC: Recent Labs  Lab 07/24/22 0457 07/24/22 1728 07/25/22 0519 07/27/22 1508 07/28/22 0232  WBC 10.4 10.4 9.0 14.2* 24.6*  HGB 6.5* 8.9* 8.2* 10.4* 9.2*  HCT 21.1* 28.6* 26.1* 34.1* 29.3*  MCV 84.4 82.7 83.7 84.8 81.2  PLT 177 198 187 337 323   Cardiac Enzymes: No results for input(s): "CKTOTAL", "CKMB", "CKMBINDEX", "TROPONINI" in the last 168 hours.  BNP (last 3 results) Recent Labs    07/23/22 1224 07/24/22 0457 07/25/22 0519  BNP 34.6 36.3 33.3    ProBNP (last 3 results) No results for input(s): "PROBNP" in the last 8760 hours.  CBG: No results for input(s): "GLUCAP" in the last 168 hours.  Radiological Exams on Admission: DG CHEST PORT 1 VIEW  Result Date: 07/28/2022 CLINICAL DATA:  Abdominal distension, vomiting EXAM: PORTABLE CHEST 1 VIEW COMPARISON:  07/27/2022 FINDINGS: Low lung volumes with bibasilar atelectasis. Heart and mediastinal contours are within normal limits. No effusions. No acute bony abnormality. Gaseous distention of the colon seen in the visualized upper abdomen. IMPRESSION: Low lung volumes, bibasilar atelectasis. Electronically Signed   By: Charlett Nose M.D.   On: 07/28/2022 01:43   DG Abd Portable 1V  Result Date: 07/28/2022 CLINICAL DATA:  Abdominal pain, distention, vomiting EXAM: PORTABLE ABDOMEN - 1 VIEW COMPARISON:  CT earlier tonight FINDINGS: Gaseous distension of the colon diffusely as seen on CT. No small bowel distention. No organomegaly or free air. Coils are seen from prior variceal embolization. IMPRESSION: Diffuse gaseous distention of the colon may reflect ileus. Electronically Signed   By: Charlett Nose M.D.   On: 07/28/2022 01:43   CT ABDOMEN PELVIS W CONTRAST  Result Date: 07/27/2022 CLINICAL DATA:  Abdominal pain, vomiting EXAM: CT ABDOMEN AND PELVIS WITH CONTRAST TECHNIQUE: Multidetector CT imaging of the abdomen and pelvis was performed using the standard protocol following bolus administration of intravenous contrast. RADIATION DOSE REDUCTION: This exam was performed according to the departmental dose-optimization program which includes automated exposure control, adjustment of the mA and/or kV according to patient size and/or use of iterative reconstruction technique. CONTRAST:  75mL OMNIPAQUE IOHEXOL 350 MG/ML SOLN COMPARISON:  05/07/2022 FINDINGS: Lower chest: Incidentally noted partially occlusive thrombus in the left lower lobe pulmonary artery segmental branch. Bibasilar airspace opacities with air bronchograms could reflect atelectasis or pneumonia. No effusions. Hepatobiliary: tips stent in place. Numerous scattered subcentimeter hypodensities in the liver are stable, likely cysts. Gallbladder unremarkable. No biliary ductal dilatation. Pancreas: Coil embolization of varices in the pancreatic region. No focal abnormality or ductal dilatation. Spleen: No focal abnormality.  Normal size. Adrenals/Urinary Tract: No adrenal abnormality. No focal renal abnormality. No stones or hydronephrosis. Urinary bladder is unremarkable. Stomach/Bowel: Normal appendix. The colon is moderately dilated to the sigmoid colon where there is  gradual tapering. No visible obstructing process or mass lesion. Air-fluid levels noted in the dilated colon. Stomach and small bowel decompressed, unremarkable. Vascular/Lymphatic: No evidence of aneurysm or adenopathy. Reproductive: No visible focal abnormality. Other: No free fluid or free air. Musculoskeletal: No acute bony abnormality. IMPRESSION: Incidentally noted partially occlusive pulmonary embolus in the left lower lobe. Full extent/involvement could be evaluated with CTA chest. Bilateral  lower lobe airspace opacities with air bronchograms could reflect atelectasis or pneumonia. Gaseous distention of the colon to the sigmoid colon with scattered air-fluid levels. No visible obstructing process. Findings may reflect adynamic ileus. Normal appendix. Prior tips and variceal coil embolization. Electronically Signed   By: Charlett Nose M.D.   On: 07/27/2022 21:11   DG Abdomen Acute W/Chest  Result Date: 07/27/2022 CLINICAL DATA:  Constipation EXAM: DG ABDOMEN ACUTE WITH 1 VIEW CHEST COMPARISON:  07/21/2022 FINDINGS: Diffusely gas-filled, distended colon, measuring up to 8.8 cm in caliber, with gas, stool, and fluid present to the rectum. No free air in the abdomen. TIPS projects over the right upper quadrant and coil material projects over the left hemiabdomen. Heart size and mediastinal contours are within normal limits. Both lungs are clear. IMPRESSION: Diffusely gas-filled, distended colon, measuring up to 8.8 cm in caliber, with gas, stool, and fluid present to the rectum. Findings are most consistent with ileus. Electronically Signed   By: Jearld Lesch M.D.   On: 07/27/2022 16:40    EKG: I independently viewed the EKG done and my findings are as followed: Sinus rhythm rate of 92.  Nonspecific ST-T changes with QTc 442.  Assessment/Plan Present on Admission:  Abdominal pain  Principal Problem:   Abdominal pain  Diffuse severe abdominal pain likely secondary to ileus Presented with abdominal  distention, hypoactive bowel sounds, last bowel movement was this morning watery with chunks of stools in it.  No recurrent bowel movement reported since being in the hospital. IV fluid hydration Optimize magnesium level greater than 2.0 Optimize potassium level greater than 4.0. NG tube placement General surgery consultation Mobilize early as tolerated  Possible intra-abdominal infection SIRS criteria Presents with leukocytosis, tachycardia and tachypnea Follow peripheral blood cultures x 2 Started on Zosyn, continue until active infective process can be ruled out Monitor fever curve and WBCs  Pulmonary embolism, seen on CT abdomen and pelvis CT angio chest is pending, follow results Started on heparin drip GI consulted to provide recommendation regarding anticoagulation  Noncirrhotic portal hypertension with varices with history of recurrent GI bleed status post TIPS procedure by IR 07/21/22 IR consulted to assess Started IV Protonix 40 mg twice daily, continue until seen by GI. Closely monitor H&H and vital signs while on heparin drip  History of recurrent GI bleeding GI consulted to assist with the management. Closely monitor serial H&H every 6 hours No overt bleeding reported at this time Maintain MAP greater than 65  Obesity BMI 31 Recommend weight loss outpatient with regular physical activity and healthy dieting.    DVT prophylaxis: Heparin drip.  Code Status: Full code  Family Communication: Updated his parents at bedside.  Disposition Plan: Admitted to progressive care unit  Consults called: GI, Dr. Chales Abrahams and general surgery, Dr. Bedelia Person.  Admission status: Inpatient status.   Status is: Inpatient The patient requires at least 2 midnights for further evaluation and treatment of present condition.   Darlin Drop MD [facility] Pager [phone]  If 7PM-7AM, please contact night-coverage [email or web address] Password [facility]  07/28/2022, 4:45 AM

## 2022-07-28 NOTE — Consult Note (Addendum)
Consultation  Referring Provider:  [facility]  Primary Care Physician:  Joaquim Nam, MD Primary Gastroenterologist:  Dr. Myrtie Neither       Reason for Consultation:   Abdominal pain  LOS: 1 day          HPI:   Nathan Rowland is a 42 y.o. 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  Mild portal hypertensive gastropathy seen as well s/p transplenic TIPS 5/22 presents for abdominal pain  Patient recently hospitalized 5/12-5/26 for GI bleed (on anticoagulation in the setting of cirrhotic portal hypertension with cavernous transformation of the portal vein-splenomegaly/esophageal varices). Underwent small bowel enteroscopy, video capsule study, and colonoscopy with no clear source of bleeding (see below).  GI bleed was perhaps a slow intermittent oozing from his esophageal varices.  Patient underwent transplenic TIPS 5/22.  Patient experienced significant postprocedural pain question due to postembolization necrosis.  Pain improved and patient received 2 units of PRBCs 07/24/2022. Had no further bleeding and he was discharged 5/26.  Throughout admission patient received 9 units PRBCs and total.  Patient and parents state he got home Sunday 5/26.  Then on Monday afternoon 5/27 he began having generalized severe abdominal pain that was constant.  Associated nausea with nonbloody bilious emesis.  Denies melena/hematochezia.  Had liquid bowel movements yesterday morning and has had multiple bowel movements today but are also watery.  Reports minimal flatulence.  Upon admission, abdominal x-ray shows ileus.  CT scan shows partially occlusive pulmonary embolus and left lower lobe.  Patient was tachycardic and tachypneic and admitted.  Leukocytosis of 24.6  CTA shows pulmonary emboli left lower lobe, trace  pleural effusion.  TIPS noted.  Calcified granuloma in right lobe of liver.  High attenuation material in lumen of gallbladder (biliary sludge versus contrast material)  Pertinent values -Hgb 9.2, stable -WBC 24.6 -BUN 8, creatinine 0.86 -Phosphorus 3.5 -Magnesium 2.1 -Lipase 33      PREVIOUS GI WORKUP---------------------------------------------------  EGD/enteroscopy 5/13: normal jejunum - tattooed. Type 1 gastroesophageal varices without bleeding, grade 3 esophageal varices, no specimens collected  Capsule endoscopy 5/13: No upper GI or small bowel source of bleeding.  Several areas of nonspecific mucosal abnormality of unclear clinical significance.  Possibly sequela of portal venous hypertension.  Colonoscopy 5/15: no clear source of bleeding.  Past Medical History:  Diagnosis Date   Allergy 11/29/1996   Finger fracture    Kidney calculus    Left knee pain    dx'd with L retropatellar knee pain    Surgical History:  He  has a past surgical history that includes IR Radiologist Eval & Mgmt (06/02/2022); enteroscopy (N/A, 07/12/2022); Givens capsule study (N/A, 07/12/2022); Submucosal tattoo injection (07/12/2022); Colonoscopy with propofol (N/A, 07/14/2022); IR US Guide Vasc Access Right (07/21/2022); IR EMBO ART  VEN HEMORR LYMPH EXTRAV  INC GUIDE ROADMAPPING (07/21/2022); IR US Guide Vasc Access Left (07/21/2022); IR Tips (07/21/2022); IR US Guide Vasc Access Right (07/21/2022); and Radiology with anesthesia (N/A, 07/21/2022). Family History:  His family history includes Cancer in his maternal grandfather, paternal grandmother, and another family member; Diabetes in an other family member; Emphysema in his paternal grandfather; Heart disease in his maternal grandmother; Stroke in his maternal grandmother. Social History:   reports that he has never smoked. He has been exposed to tobacco smoke. He has never used smokeless tobacco.  He reports current alcohol use. He reports that he does  not use drugs.  Prior to Admission medications   Medication Sig Start Date End Date Taking? Authorizing Provider  folic acid (FOLVITE) 1 MG tablet Take 1 tablet (1 mg total) by mouth daily. 07/25/22  Yes Leroy Sea, MD  HYDROcodone-acetaminophen (NORCO/VICODIN) 5-325 MG tablet Take 1 tablet by mouth every 8 (eight) hours as needed for moderate pain. 07/25/22  Yes Leroy Sea, MD  Iron, Ferrous Sulfate, 325 (65 Fe) MG TABS Take 325 mg by mouth daily. 07/25/22  Yes Leroy Sea, MD  lactulose (CHRONULAC) 10 GM/15ML solution Take 22.5 mLs (15 g total) by mouth 2 (two) times daily as needed for mild constipation. 07/27/22 07/22/23 Yes Joaquim Nam, MD  EPINEPHrine 0.3 mg/0.3 mL IJ SOAJ injection Inject 0.3 mg into the muscle once as needed for anaphylaxis. Patient not taking: Reported on 07/27/2022    [provider]  midodrine (PROAMATINE) 10 MG tablet Take 1 tablet (10 mg total) by mouth 2 (two) times daily with a meal. Patient not taking: Reported on 07/27/2022 07/25/22   Leroy Sea, MD    Current Facility-Administered Medications  Medication Dose Route Frequency Provider Last Rate Last Admin   0.9 %  sodium chloride infusion   Intravenous Continuous Darlin Drop, DO 100 mL/hr at 07/27/22 2339 New Bag at 07/27/22 2339   heparin ADULT infusion 100 units/mL (25000 units/215mL)  1,800 Units/hr Intravenous Continuous Juliette Mangle, RPH 18 mL/hr at 07/28/22 0445 1,800 Units/hr at 07/28/22 0445   HYDROmorphone (DILAUDID) injection 1 mg  1 mg Intravenous Q3H PRN Dow Adolph N, DO   1 mg at 07/28/22 0446   naloxone (NARCAN) injection 0.4 mg  0.4 mg Intravenous PRN Dow Adolph N, DO       pantoprazole (PROTONIX) injection 40 mg  40 mg Intravenous BID Dow Adolph N, DO   40 mg at 07/28/22 0825   piperacillin-tazobactam (ZOSYN) IVPB 3.375 g  3.375 g Intravenous Q8H Marygrace Drought, RPH 12.5 mL/hr at 07/28/22 0832 3.375 g at 07/28/22 8295   prochlorperazine (COMPAZINE)  injection 5 mg  5 mg Intravenous Q6H PRN Dow Adolph N, DO   5 mg at 07/28/22 0825   Facility-Administered Medications Ordered in Other Encounters  Medication Dose Route Frequency Provider Last Rate Last Admin   acetaminophen (TYLENOL) tablet 650 mg  650 mg Oral Once Desma Mcgregor, [facility]       acetaminophen (TYLENOL) tablet 650 mg  650 mg Oral Once Desma Mcgregor, RPH       diphenhydrAMINE (BENADRYL) capsule 25 mg  25 mg Oral Once Desma Mcgregor, RPH       diphenhydrAMINE (BENADRYL) capsule 25 mg  25 mg Oral Once Desma Mcgregor, [facility]       iron sucrose (VENOFER) 200 mg in sodium chloride 0.9 % 100 mL IVPB  200 mg Intravenous Once Desma Mcgregor, RPH       iron sucrose (VENOFER) 200 mg in sodium chloride 0.9 % 100 mL IVPB  200 mg Intravenous Once Desma Mcgregor, [facility]        Allergies as of 07/27/2022 - Review Complete 07/27/2022  Allergen Reaction Noted   Pollen extract  03/27/2021    Review of Systems  Constitutional:  Negative for chills, fever and weight loss.  HENT:  Negative for hearing loss and tinnitus.   Eyes:  Negative for blurred vision and double vision.  Respiratory:  Negative for cough and hemoptysis.  Cardiovascular:  Negative for chest pain and orthopnea.  Gastrointestinal:  Positive for abdominal pain, diarrhea, nausea and vomiting. Negative for blood in stool, constipation, heartburn and melena.  Genitourinary:  Negative for dysuria and urgency.  Musculoskeletal:  Negative for myalgias and neck pain.  Skin:  Negative for itching and rash.  Neurological:  Negative for seizures and loss of consciousness.  Psychiatric/Behavioral:  Negative for depression and suicidal ideas.        Physical Exam:  Vital signs in last 24 hours: Temp:  [97.6 F (36.4 C)-99.4 F (37.4 C)] 98.6 F (37 C) (05/29 1142) Pulse Rate:  [99-127] 124 (05/29 1142) Resp:  [18-35] 23 (05/29 1142) BP: (129-140)/(74-90) 134/74 (05/29 1142) SpO2:  [90 %-95 %] 94 % (05/29 1142) Last BM Date : 07/27/22 Last BM  recorded by nurses in past 5 days Stool Type: Type 5 (Soft blobs with clear-cut edges); Type 6 (Mushy consistency with ragged edges) (07/24/2022  8:00 PM)  Physical Exam Constitutional:      Appearance: He is well-developed. He is ill-appearing.  HENT:     Head: Normocephalic and atraumatic.     Nose: Nose normal. No congestion.     Mouth/Throat:     Mouth: Mucous membranes are moist.     Pharynx: Oropharynx is clear.  Eyes:     Extraocular Movements: Extraocular movements intact.     Conjunctiva/sclera: Conjunctivae normal.  Cardiovascular:     Rate and Rhythm: Regular rhythm. Tachycardia present.  Pulmonary:     Effort: Pulmonary effort is normal. No respiratory distress.  Abdominal:     Comments: Abdominal distention, soft, distant very hypoactive/absent bowel sounds.  Minimal tenderness to palpation.  Musculoskeletal:        General: No swelling. Normal range of motion.     Cervical back: Normal range of motion and neck supple.  Skin:    General: Skin is warm and dry.  Neurological:     General: No focal deficit present.     Mental Status: He is oriented to person, place, and time.  Psychiatric:        Mood and Affect: Mood normal.        Behavior: Behavior normal.        Thought Content: Thought content normal.        Judgment: Judgment normal.      LAB RESULTS: Recent Labs    07/27/22 1508 07/28/22 0232  WBC 14.2* 24.6*  HGB 10.4* 9.2*  HCT 34.1* 29.3*  PLT 337 323   BMET Recent Labs    07/27/22 1508 07/28/22 0232  NA 135 135  K 4.1 4.1  CL 106 105  CO2 21* 21*  GLUCOSE 119* 138*  BUN 6 8  CREATININE 0.88 0.86  CALCIUM 9.0 8.6*   LFT Recent Labs    07/28/22 0232  PROT 6.5  ALBUMIN 3.2*  AST 24  ALT 61*  ALKPHOS 96  BILITOT 0.6   PT/INR No results for input(s): "LABPROT", "INR" in the last 72 hours.  STUDIES: DG Abd 1 View  Result Date: 07/28/2022 CLINICAL DATA:  Enteric tube placement EXAM: ABDOMEN - 1 VIEW COMPARISON:  Abdominal  radiograph dated 07/28/2022 at 1:34 a.m. FINDINGS: Gastric/enteric tube tip projects over the stomach. Similar diffuse gas-filled dilation of bowel loops. TIPS graft projects over the right upper quadrant. Multiple embolization coils in the left upper quadrant. IMPRESSION: Gastric/enteric tube tip projects over the stomach. Electronically Signed   By: Agustin Cree M.D.   On: 07/28/2022 08:47  CT Angio Chest PE W and/or Wo Contrast  Result Date: 07/28/2022 CLINICAL DATA:  42 year old male with shortness of breath. Suspected pulmonary embolism. EXAM: CT ANGIOGRAPHY CHEST WITH CONTRAST TECHNIQUE: Multidetector CT imaging of the chest was performed using the standard protocol during bolus administration of intravenous contrast. Multiplanar CT image reconstructions and MIPs were obtained to evaluate the vascular anatomy. RADIATION DOSE REDUCTION: This exam was performed according to the departmental dose-optimization program which includes automated exposure control, adjustment of the mA and/or kV according to patient size and/or use of iterative reconstruction technique. CONTRAST:  75mL OMNIPAQUE IOHEXOL 350 MG/ML SOLN COMPARISON:  No priors. FINDINGS: Cardiovascular: Large filling defect in segmental and subsegmental sized pulmonary artery branches to the left lower lobe compatible with pulmonary embolism. This appears nearly occlusive at this time. No definite right-sided emboli are noted. Heart size is normal. Estimated left ventricular diameter of 46 mm. Estimated right ventricular diameter of 41 mm. RV to LV ratio of 0.89. No atherosclerotic calcifications are noted in the thoracic aorta or the coronary arteries. Mediastinum/Nodes: Nasogastric tube extending into the distal stomach. No pathologically enlarged mediastinal or hilar lymph nodes. Esophagus is otherwise unremarkable in appearance. No axillary lymphadenopathy. Lungs/Pleura: Extensive areas of passive atelectasis are noted in the lower lobes of the  lungs bilaterally. Some consolidative airspace disease also noted in the left lower lobe. Trace right pleural effusion. No left pleural effusion. Upper Abdomen: TIPS noted. Calcified granuloma in the right lobe of the liver incidentally noted. High attenuation material in the lumen of the gallbladder, either biliary sludge or vicarious excretion of contrast material from recent contrast-enhanced CT examinations. Numerous embolization coils are noted in the upper abdomen. Musculoskeletal: There are no aggressive appearing lytic or blastic lesions noted in the visualized portions of the skeleton. Review of the MIP images confirms the above findings. IMPRESSION: 1. Study is positive for pulmonary emboli in segmental and subsegmental sized branches to the left lower lobe. This appears nearly completely occlusive in some vessels. 2. Extensive passive atelectasis in the lower lobes of the lungs bilaterally with some airspace consolidation in the left lower lobe as well, which may reflect areas of alveolar hemorrhage in the setting of acute pulmonary infarct. 3. Trace right pleural effusion. 4. Support apparatus and postprocedural changes, as above. Electronically Signed   By: Trudie Reed M.D.   On: 07/28/2022 07:12   DG CHEST PORT 1 VIEW  Result Date: 07/28/2022 CLINICAL DATA:  Abdominal distension, vomiting EXAM: PORTABLE CHEST 1 VIEW COMPARISON:  07/27/2022 FINDINGS: Low lung volumes with bibasilar atelectasis. Heart and mediastinal contours are within normal limits. No effusions. No acute bony abnormality. Gaseous distention of the colon seen in the visualized upper abdomen. IMPRESSION: Low lung volumes, bibasilar atelectasis. Electronically Signed   By: Charlett Nose M.D.   On: 07/28/2022 01:43   DG Abd Portable 1V  Result Date: 07/28/2022 CLINICAL DATA:  Abdominal pain, distention, vomiting EXAM: PORTABLE ABDOMEN - 1 VIEW COMPARISON:  CT earlier tonight FINDINGS: Gaseous distension of the colon diffusely  as seen on CT. No small bowel distention. No organomegaly or free air. Coils are seen from prior variceal embolization. IMPRESSION: Diffuse gaseous distention of the colon may reflect ileus. Electronically Signed   By: Charlett Nose M.D.   On: 07/28/2022 01:43   CT ABDOMEN PELVIS W CONTRAST  Result Date: 07/27/2022 CLINICAL DATA:  Abdominal pain, vomiting EXAM: CT ABDOMEN AND PELVIS WITH CONTRAST TECHNIQUE: Multidetector CT imaging of the abdomen and pelvis was performed using the  standard protocol following bolus administration of intravenous contrast. RADIATION DOSE REDUCTION: This exam was performed according to the departmental dose-optimization program which includes automated exposure control, adjustment of the mA and/or kV according to patient size and/or use of iterative reconstruction technique. CONTRAST:  75mL OMNIPAQUE IOHEXOL 350 MG/ML SOLN COMPARISON:  05/07/2022 FINDINGS: Lower chest: Incidentally noted partially occlusive thrombus in the left lower lobe pulmonary artery segmental branch. Bibasilar airspace opacities with air bronchograms could reflect atelectasis or pneumonia. No effusions. Hepatobiliary: tips stent in place. Numerous scattered subcentimeter hypodensities in the liver are stable, likely cysts. Gallbladder unremarkable. No biliary ductal dilatation. Pancreas: Coil embolization of varices in the pancreatic region. No focal abnormality or ductal dilatation. Spleen: No focal abnormality.  Normal size. Adrenals/Urinary Tract: No adrenal abnormality. No focal renal abnormality. No stones or hydronephrosis. Urinary bladder is unremarkable. Stomach/Bowel: Normal appendix. The colon is moderately dilated to the sigmoid colon where there is gradual tapering. No visible obstructing process or mass lesion. Air-fluid levels noted in the dilated colon. Stomach and small bowel decompressed, unremarkable. Vascular/Lymphatic: No evidence of aneurysm or adenopathy. Reproductive: No visible focal  abnormality. Other: No free fluid or free air. Musculoskeletal: No acute bony abnormality. IMPRESSION: Incidentally noted partially occlusive pulmonary embolus in the left lower lobe. Full extent/involvement could be evaluated with CTA chest. Bilateral lower lobe airspace opacities with air bronchograms could reflect atelectasis or pneumonia. Gaseous distention of the colon to the sigmoid colon with scattered air-fluid levels. No visible obstructing process. Findings may reflect adynamic ileus. Normal appendix. Prior tips and variceal coil embolization. Electronically Signed   By: Charlett Nose M.D.   On: 07/27/2022 21:11   DG Abdomen Acute W/Chest  Result Date: 07/27/2022 CLINICAL DATA:  Constipation EXAM: DG ABDOMEN ACUTE WITH 1 VIEW CHEST COMPARISON:  07/21/2022 FINDINGS: Diffusely gas-filled, distended colon, measuring up to 8.8 cm in caliber, with gas, stool, and fluid present to the rectum. No free air in the abdomen. TIPS projects over the right upper quadrant and coil material projects over the left hemiabdomen. Heart size and mediastinal contours are within normal limits. Both lungs are clear. IMPRESSION: Diffusely gas-filled, distended colon, measuring up to 8.8 cm in caliber, with gas, stool, and fluid present to the rectum. Findings are most consistent with ileus. Electronically Signed   By: Jearld Lesch M.D.   On: 07/27/2022 16:40      Impression/Plan   Diffuse abdominal pain likely secondary to ileus Abdominal x-ray shows ileus NG tube placed by general surgery Phosphorus 3.5 Magnesium 2.1 Lipase 33 Having multiple bowel movements this AM and feels improvement in abdominal pain. Physical exam shows distended abdomen with minimal distant bowel sounds. - Continue to maintain magnesium above 2 and potassium at 4-4.5.  - NG tube in place - Dulcolax suppository - Consider repeat abdominal xray tomorrow  Noncirrhotic portal hypertension with varices with history of recurrent GI bleed s/p  TIPS procedure by IR 07/21/22  - hgb 9.2 - normal BUN, Cr. - AST 35/ ALT 74, Alk phos 108 Hgb stable, no overt bleeding. TIPS appears patent without evidence of post procedure bleeding or complication. IR also consulted and following. - Continue protonix 40mg  IV BID - Appreciate IR following as well - No indication for procedures at this time. Monitor for overt bleeding. If re-bleeding, will repeat EGD/capsule endoscopy  PE - on heparin drip - CT angiogram showed pulmonary emboli in segmental and subsegmental sized branches to the left lower lobe,extensive passive atelectasis in the lower lobes of the  lungs bilaterally with some airspace consolidation in the left lower lobe as well, which may reflect areas of alveolar hemorrhage in the setting of acute pulmonary infarct. - per primary team  SIRS -on Zosyn -WBC 24.6  Thank you for your kind consultation, we will continue to follow.  Garrie Elenes Leanna Sato  07/28/2022, 12:06 PM

## 2022-07-28 NOTE — Assessment & Plan Note (Signed)
Rationale for use discussed with patient.  His inpatient course was complicated by aspiration pneumonia.  He had a history of constipation but has been taking lactulose.  He has been trying to limit Vicodin use to limit issues with constipation.  He is off anticoagulation in the meantime.  His TIPS sites appear to be healing without infection.  He is trying to manage his GI symptoms and we talked about trying to limit Vicodin use in the meantime, if his pain is reasonably controlled and potentially skipping 1 dose of lactulose given his bowel movement frequency.  He agreed with that and he can update me as needed.  He has hematology follow-up pending and we agreed to defer his labs today.  At this point still okay for outpatient follow-up.

## 2022-07-28 NOTE — Progress Notes (Signed)
ANTICOAGULATION CONSULT NOTE - Follow Up Consult  Pharmacy Consult for heparin Indication: pulmonary embolus  Height: 6 ft. Weight :  103.9 kg IBW:  77.6 kg Heparin dosing weight: 99.1 kg  Labs: Recent Labs    07/27/22 1508 07/28/22 0231 07/28/22 0232 07/28/22 1231 07/28/22 2140  HGB 10.4*  --  9.2*  --   --   HCT 34.1*  --  29.3*  --   --   PLT 337  --  323  --   --   HEPARINUNFRC  --  0.24*  --  0.21* 0.47  CREATININE 0.88  --  0.86  --   --      Assessment: 41yo male started  on  IV heparin infusion with initial dosing for PE.  Heparin drip 2000 uts/hr with heparin level 0.47 at goal  No issues or interruptions with heparin infusion and no   bleeding noted per RN 's report.   Goal of Therapy:  Heparin level 0.3-0.7 units/ml   Plan:  Continue heparin infusion 2000 units/hr. Daily HL and CBC     Leota Sauers Pharm.D. CPP, BCPS Clinical Pharmacist [phone] 07/28/2022 10:29 PM

## 2022-07-28 NOTE — TOC Initial Note (Signed)
Transition of Care [facility]) - Initial/Assessment Note    Patient Details  Name: Nathan Rowland MRN: [id number] Date of Birth: May 18, 1980  [facility]) CM/SW Contact:    Lawerance Sabal, RN Phone Number: 07/28/2022, 4:37 PM  Clinical Narrative:                  Patient admitted with ileus and PE. Requested test claim for DOACs.    Expected Discharge Plan: Home/Self Care Barriers to Discharge: Continued Medical Work up   Patient Goals and CMS Choice            Expected Discharge Plan and Services                                              Prior Living Arrangements/Services                       Activities of Daily Living      Permission Sought/Granted                  Emotional Assessment              Admission diagnosis:  Dehydration [E86.0] Ileus (HCC) [K56.7] Generalized abdominal pain [R10.84] History of GI bleed [Z87.19] Nausea vomiting and diarrhea [R11.2, R19.7] Abdominal pain [R10.9] Pneumonia of both lower lobes due to infectious organism [J18.9] Single subsegmental pulmonary embolism without acute cor pulmonale (HCC) [I26.93] Patient Active Problem List   Diagnosis Date Noted   Abdominal pain 07/27/2022   Current use of long term anticoagulation 07/13/2022   Portal hypertension (HCC) 07/13/2022   Acute upper GI bleed 07/12/2022   Blood loss anemia 07/12/2022   Esophageal varices (HCC) 07/12/2022   Melena 07/12/2022   Acute hypoxic respiratory failure (HCC) 07/12/2022   Iron deficiency anemia 07/05/2022   Back strain 05/26/2022   Warts 05/06/2022   Kidney calculus 08/04/2021   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 AT [facility] [address] [place] [address] Phone: [phone] Fax: [phone]     Social Determinants of Health (SDOH) Social History: SDOH Screenings   Food Insecurity: No Food Insecurity (07/11/2022)  Housing: Low Risk  (07/11/2022)  Transportation Needs: No Transportation Needs (07/11/2022)  Utilities: Not At Risk (07/11/2022)  Alcohol Screen: Low Risk  (05/25/2022)  Depression (PHQ2-9): Low Risk  (07/27/2022)  Financial Resource Strain: Patient Declined (05/25/2022)  Physical Activity: Unknown (05/25/2022)  Social Connections: Unknown (05/25/2022)  Stress: Patient Declined (05/25/2022)  Tobacco Use: Low Risk  (07/27/2022)   SDOH Interventions:     Readmission Risk Interventions     No data to display

## 2022-07-28 NOTE — Progress Notes (Signed)
ANTICOAGULATION CONSULT NOTE - Follow Up Consult  Pharmacy Consult for heparin Indication: pulmonary embolus  Labs: Recent Labs    07/25/22 0519 07/27/22 1508 07/28/22 0231 07/28/22 0232  HGB 8.2* 10.4*  --  9.2*  HCT 26.1* 34.1*  --  29.3*  PLT 187 337  --  323  HEPARINUNFRC  --   --  0.24*  --   CREATININE 0.88 0.88  --  0.86    Assessment: 41yo male subtherapeutic on heparin with initial dosing for PE; no infusion issues or signs of bleeding per RN.  Goal of Therapy:  Heparin level 0.3-0.7 units/ml   Plan:  Increase heparin infusion by 2 units/kg/hr to 1800 units/hr. Check level in 6 hours.   Vernard Gambles, PharmD, BCPS 07/28/2022 4:31 AM

## 2022-07-28 NOTE — Progress Notes (Signed)
   Pt known to IR TIPs with variceal embolization performed 07/21/22  Admitted yesterday with abd pain ; N/V/D   IMPRESSION: CT yesterday: Incidentally noted partially occlusive pulmonary embolus in the left lower lobe. Full extent/involvement could be evaluated with CTA chest. Bilateral lower lobe airspace opacities with air bronchograms could reflect atelectasis or pneumonia. Gaseous distention of the colon to the sigmoid colon with scattered air-fluid levels. No visible obstructing process. Findings may reflect adynamic ileus. Normal appendix. Prior tips and variceal coil embolization.  Dr Odis Luster has reviewed imaging and chart  Can anticoagulate from IR standpoint if deem appropriate No further IR interventions recommended at this time   Will inform MD

## 2022-07-28 NOTE — Progress Notes (Signed)
ANTICOAGULATION CONSULT NOTE - Follow Up Consult  Pharmacy Consult for heparin Indication: pulmonary embolus  Height: 6 ft. Weight :  103.9 kg IBW:  77.6 kg Heparin dosing weight: 99.1 kg  Labs: Recent Labs    07/27/22 1508 07/28/22 0231 07/28/22 0232 07/28/22 1231  HGB 10.4*  --  9.2*  --   HCT 34.1*  --  29.3*  --   PLT 337  --  323  --   HEPARINUNFRC  --  0.24*  --  0.21*  CREATININE 0.88  --  0.86  --      Assessment: 41yo male started  on  IV heparin infusion with initial dosing for PE.  HL = 0.21, subtherapeutic on heparin 1800 units/hr.  No issues or interruptions with heparin infusion and no   bleeding noted per RN 's report.   Goal of Therapy:  Heparin level 0.3-0.7 units/ml   Plan:  Give heparin 1500 units IV x1 now and Increase heparin infusion by 2 units/kg/hr to 2000 units/hr. Check level in 6 hours.  Daily HL and CBC   Thank you for allowing pharmacy to be part of this patients care team.  Noah Delaine, RPh Clinical Pharmacist 07/28/2022 1:46 PM

## 2022-07-28 NOTE — Progress Notes (Signed)
Brief same day note:  Patient is a 42 year old male with history of cirrhotic portal hypertension, splenomegaly, portal vein thrombosis on Eliquis, hypertension esophageal varices who underwent TIPS, embolization of varices on 5/22 by IR presented with abdominal pain.  Patient was recently admitted for recurrent GI bleed and acute blood loss anemia and received several units of PRBC.  No report of hematochezia or melena.  Abdominal x-ray on presentation showed ileus.  Abdomen CT with contrast showed incidental finding of partially occlusive pulmonary emboli in the left lower lobe.  CT angiogram showed   pulmonary emboli in segmental and subsegmental sized branches to the left lower lobe,extensive passive atelectasis in the lower lobes of the lungs bilaterally with some airspace consolidation in the left lower lobe as well, which may reflect areas of alveolar hemorrhage in the setting of acute pulmonary infarct.  General surgery consulted for the finding of ileus.  Also started heparin drip.  Labs showed leukocytosis.  Patient seen and examined at bedside this morning.  Echo was being done at bedside.  He said he had several bowel movements.  Denies any abdominal pain, nausea or vomiting and wishes to start on some diet.  His parents were at bedside.   Assessment and plan:   Abdominal pain/ileus: Presented with abdominal distention.  NG tube placed , general surgery/GI  following. Apparently patient had several bowel movements since last night.  Abdomen appears mildly distended, nontender.  Bowel sounds not that heard this morning. Presented with leukocytosis, tachycardia, tachypnea.  This is most likely from PE.  Follow-up blood cultures.  Empirically started on Zosyn.  PE: CT imagings as above.  Started on heparin drip.  GI consulted for recommendation on anticoagulation.  Acute hypoxic respiratory failure: This is secondary to PE and also bilateral atelectasis, possible alveolar hemorrhage in the  setting of acute pulmonary infarct.  Will continue to monitor respiratory status and  wean the oxygen as possible  Noncirrhotic portal hypertension/esophageal varices: Recent history of recurrent GI bleed.  Status post TIPS procedure on 5/22 by IR.  IR consulted here.  TIPS appears patent without evidence of procedural related complication.  Currently on Protonix.  GI consulted Monitor H&H  Obesity: BMI 31

## 2022-07-28 NOTE — Consult Note (Signed)
Reason for Consult:ileus Referring Physician: Maliq Inclan is an 42 y.o. male.  HPI: 42yo M with PMHx cirrhotic portal hypertension and cavernous malformation of the portal vein, splenomegaly & esophageal varices underwent TIPS and embolization of varices 5/22 by Dr. Loreta Ave. He was admitted with abdominal pain and PE. He was found to have a colonic ileus and we are consulted for surgical management. He has an NGT in place. He reports several BMs this AM but that he still feels bloated.  Past Medical History:  Diagnosis Date   Allergy 11/29/1996   Finger fracture    Kidney calculus    Left knee pain    dx'd with L retropatellar knee pain    Past Surgical History:  Procedure Laterality Date   COLONOSCOPY WITH PROPOFOL N/A 07/14/2022   Procedure: COLONOSCOPY WITH PROPOFOL;  Surgeon: Sherrilyn Rist, MD;  Location: [facility] ENDOSCOPY;  Service: Gastroenterology;  Laterality: N/A;   ENTEROSCOPY N/A 07/12/2022   Procedure: ENTEROSCOPY;  Surgeon: Sherrilyn Rist, MD;  Location: [facility] ENDOSCOPY;  Service: Gastroenterology;  Laterality: N/A;   GIVENS CAPSULE STUDY N/A 07/12/2022   Procedure: GIVENS CAPSULE STUDY;  Surgeon: Sherrilyn Rist, MD;  Location: [facility] ENDOSCOPY;  Service: Gastroenterology;  Laterality: N/A;   IR EMBO ART  VEN HEMORR LYMPH EXTRAV  INC GUIDE ROADMAPPING  07/21/2022   IR RADIOLOGIST EVAL & MGMT  06/02/2022   IR TIPS  07/21/2022   IR US GUIDE VASC ACCESS LEFT  07/21/2022   IR US GUIDE VASC ACCESS RIGHT  07/21/2022   IR US GUIDE VASC ACCESS RIGHT  07/21/2022   RADIOLOGY WITH ANESTHESIA N/A 07/21/2022   Procedure: TIPS;  Surgeon: Bennie Dallas, MD;  Location: [facility] OR;  Service: Radiology;  Laterality: N/A;   SUBMUCOSAL TATTOO INJECTION  07/12/2022   Procedure: SUBMUCOSAL TATTOO INJECTION;  Surgeon: Sherrilyn Rist, MD;  Location: [facility] ENDOSCOPY;  Service: Gastroenterology;;    Family History  Problem Relation Age of Onset   Diabetes Other        3 great  aunts, adult onset   Cancer Other        Uterine   Heart disease Maternal Grandmother        MI   Stroke Maternal Grandmother    Cancer Maternal Grandfather        Lung   Cancer Paternal Grandmother        Skin   Emphysema Paternal Grandfather    Hypertension Neg Hx    Depression Neg Hx    Alcohol abuse Neg Hx    Drug abuse Neg Hx    Prostate cancer Neg Hx    Colon cancer Neg Hx     Social History:  reports that he has never smoked. He has been exposed to tobacco smoke. He has never used smokeless tobacco. He reports current alcohol use. He reports that he does not use drugs.  Allergies:  Allergies  Allergen Reactions   Pollen Extract      Allergy injection    Medications: I have reviewed the patient's current medications.  Results for orders placed or performed during the hospital encounter of 07/27/22 (from the past 48 hour(s))  Lipase, blood     Status: None   Collection Time: 07/27/22  3:08 PM  Result Value Ref Range   Lipase 33 11 - 51 U/L    Comment: Performed at [facility], [address], [place], [place] [address]  Comprehensive metabolic  panel     Status: Abnormal   Collection Time: 07/27/22  3:08 PM  Result Value Ref Range   Sodium 135 135 - 145 mmol/L   Potassium 4.1 3.5 - 5.1 mmol/L   Chloride 106 98 - 111 mmol/L   CO2 21 (L) 22 - 32 mmol/L   Glucose, Bld 119 (H) 70 - 99 mg/dL    Comment: Glucose reference range applies only to samples taken after fasting for at least 8 hours.   BUN 6 6 - 20 mg/dL   Creatinine, Ser 2.95 0.61 - 1.24 mg/dL   Calcium 9.0 8.9 - 62.1 mg/dL   Total Protein 7.2 6.5 - 8.1 g/dL   Albumin 3.6 3.5 - 5.0 g/dL   AST 35 15 - 41 U/L   ALT 74 (H) 0 - 44 U/L   Alkaline Phosphatase 108 38 - 126 U/L   Total Bilirubin 0.7 0.3 - 1.2 mg/dL   GFR, Estimated >30 >86 mL/min    Comment: (NOTE) Calculated using the CKD-EPI Creatinine Equation (2021)    Anion gap 8 5 - 15    Comment: Performed at [facility], [address], [place], [place] [address]  CBC     Status: Abnormal   Collection Time: 07/27/22  3:08 PM  Result Value Ref Range   WBC 14.2 (H) 4.0 - 10.5 K/uL   RBC 4.02 (L) 4.22 - 5.81 MIL/uL   Hemoglobin 10.4 (L) 13.0 - 17.0 g/dL   HCT 96.2 (L) 95.2 - 84.1 %   MCV 84.8 80.0 - 100.0 fL   MCH 25.9 (L) 26.0 - 34.0 pg   MCHC 30.5 30.0 - 36.0 g/dL   RDW 32.4 (H) 40.1 - 02.7 %   Platelets 337 150 - 400 K/uL   nRBC 0.0 0.0 - 0.2 %    Comment: Performed at [facility], [address] [place], [place] [address]  Ammonia     Status: None   Collection Time: 07/27/22  5:03 PM  Result Value Ref Range   Ammonia 17 9 - 35 umol/L    Comment: Performed at [facility], [address], [place], [place] [address]  Urinalysis, Routine w reflex microscopic -Urine, Clean Catch     Status: Abnormal   Collection Time: 07/27/22  7:40 PM  Result Value Ref Range   Color, Urine YELLOW YELLOW   APPearance HAZY (A) CLEAR   Specific Gravity, Urine 1.026 1.005 - 1.030   pH 5.0 5.0 - 8.0   Glucose, UA NEGATIVE NEGATIVE mg/dL   Hgb urine dipstick NEGATIVE NEGATIVE   Bilirubin Urine NEGATIVE NEGATIVE   Ketones, ur NEGATIVE NEGATIVE mg/dL   Protein, ur 30 (A) NEGATIVE mg/dL   Nitrite NEGATIVE NEGATIVE   Leukocytes,Ua NEGATIVE NEGATIVE   RBC / HPF 0-5 0 - 5 RBC/hpf   WBC, UA 0-5 0 - 5 WBC/hpf   Bacteria, UA RARE (A) NONE SEEN   Squamous Epithelial / HPF 0-5 0 - 5 /HPF   Mucus PRESENT     Comment: Performed at [facility], [address], [place], [place] [address]  Culture, blood (routine x 2)     Status: None (Preliminary result)   Collection Time: 07/27/22  9:40 PM   Specimen: BLOOD  Result Value Ref Range   Specimen Description BLOOD RIGHT ANTECUBITAL    Special Requests      BOTTLES DRAWN AEROBIC AND ANAEROBIC Blood Culture results may not be optimal due to an inadequate  volume of blood received in culture bottles   Culture      NO GROWTH < 12 HOURS Performed at [facility],  [address], [place], [place] [address]    Report Status PENDING   Culture, blood (routine x 2)     Status: None (Preliminary result)   Collection Time: 07/27/22  9:51 PM   Specimen: BLOOD  Result Value Ref Range   Specimen Description BLOOD LEFT ANTECUBITAL    Special Requests      BOTTLES DRAWN AEROBIC AND ANAEROBIC Blood Culture adequate volume   Culture      NO GROWTH < 12 HOURS Performed at [facility], [address], [place], [place] [address]    Report Status PENDING   Lactic acid, plasma     Status: None   Collection Time: 07/27/22  9:55 PM  Result Value Ref Range   Lactic Acid, Venous 1.7 0.5 - 1.9 mmol/L    Comment: Performed at [facility], [address], [place], [place] [address]  Lactic acid, plasma     Status: None   Collection Time: 07/28/22  2:31 AM  Result Value Ref Range   Lactic Acid, Venous 1.8 0.5 - 1.9 mmol/L    Comment: Performed at [facility], [address], [place], [place] [address]  Heparin level (unfractionated)     Status: Abnormal   Collection Time: 07/28/22  2:31 AM  Result Value Ref Range   Heparin Unfractionated 0.24 (L) 0.30 - 0.70 IU/mL    Comment: (NOTE) The clinical reportable range upper limit is being lowered to >1.10 to align with the FDA approved guidance for the current laboratory assay.  If heparin results are below expected values, and patient dosage has  been confirmed, suggest follow up testing of antithrombin III levels. Performed at [facility], [address], [place], [place] [address]   CBC     Status: Abnormal   Collection Time: 07/28/22  2:32 AM  Result Value Ref Range   WBC 24.6 (H) 4.0 - 10.5 K/uL   RBC 3.61 (L) 4.22 - 5.81 MIL/uL   Hemoglobin 9.2 (L) 13.0 - 17.0 g/dL   HCT 65.7 (L) 84.6 - 96.2 %   MCV 81.2 80.0 - 100.0 fL   MCH 25.5 (L) 26.0 - 34.0 pg   MCHC 31.4 30.0 - 36.0 g/dL   RDW 95.2 (H) 84.1 - 32.4 %   Platelets 323 150 - 400 K/uL   nRBC 0.0 0.0 - 0.2 %    Comment:  Performed at [facility], [address] [place], [place] [address]  Comprehensive metabolic panel     Status: Abnormal   Collection Time: 07/28/22  2:32 AM  Result Value Ref Range   Sodium 135 135 - 145 mmol/L   Potassium 4.1 3.5 - 5.1 mmol/L   Chloride 105 98 - 111 mmol/L   CO2 21 (L) 22 - 32 mmol/L   Glucose, Bld 138 (H) 70 - 99 mg/dL    Comment: Glucose reference range applies only to samples taken after fasting for at least 8 hours.   BUN 8 6 - 20 mg/dL   Creatinine, Ser 7.25 0.61 - 1.24 mg/dL   Calcium 8.6 (L) 8.9 - 10.3 mg/dL   Total Protein 6.5 6.5 - 8.1 g/dL   Albumin 3.2 (L) 3.5 - 5.0 g/dL   AST 24 15 - 41 U/L   ALT 61 (H) 0 - 44 U/L  Alkaline Phosphatase 96 38 - 126 U/L   Total Bilirubin 0.6 0.3 - 1.2 mg/dL   GFR, Estimated >16 >10 mL/min    Comment: (NOTE) Calculated using the CKD-EPI Creatinine Equation (2021)    Anion gap 9 5 - 15    Comment: Performed at [facility], [address], [place], [place] [address]  Magnesium     Status: None   Collection Time: 07/28/22  2:32 AM  Result Value Ref Range   Magnesium 2.1 1.7 - 2.4 mg/dL    Comment: Performed at [facility], [address] [place], [place] [address]  Phosphorus     Status: None   Collection Time: 07/28/22  2:32 AM  Result Value Ref Range   Phosphorus 3.5 2.5 - 4.6 mg/dL    Comment: Performed at [facility], [address], [place], [place] [address]    DG Abd 1 View  Result Date: 07/28/2022 CLINICAL DATA:  Enteric tube placement EXAM: ABDOMEN - 1 VIEW COMPARISON:  Abdominal radiograph dated 07/28/2022 at 1:34 a.m. FINDINGS: Gastric/enteric tube tip projects over the stomach. Similar diffuse gas-filled dilation of bowel loops. TIPS graft projects over the right upper quadrant. Multiple embolization coils in the left upper quadrant. IMPRESSION: Gastric/enteric tube tip projects over the stomach. Electronically Signed   By: Agustin Cree M.D.   On: 07/28/2022 08:47   CT Angio  Chest PE W and/or Wo Contrast  Result Date: 07/28/2022 CLINICAL DATA:  42 year old male with shortness of breath. Suspected pulmonary embolism. EXAM: CT ANGIOGRAPHY CHEST WITH CONTRAST TECHNIQUE: Multidetector CT imaging of the chest was performed using the standard protocol during bolus administration of intravenous contrast. Multiplanar CT image reconstructions and MIPs were obtained to evaluate the vascular anatomy. RADIATION DOSE REDUCTION: This exam was performed according to the departmental dose-optimization program which includes automated exposure control, adjustment of the mA and/or kV according to patient size and/or use of iterative reconstruction technique. CONTRAST:  75mL OMNIPAQUE IOHEXOL 350 MG/ML SOLN COMPARISON:  No priors. FINDINGS: Cardiovascular: Large filling defect in segmental and subsegmental sized pulmonary artery branches to the left lower lobe compatible with pulmonary embolism. This appears nearly occlusive at this time. No definite right-sided emboli are noted. Heart size is normal. Estimated left ventricular diameter of 46 mm. Estimated right ventricular diameter of 41 mm. RV to LV ratio of 0.89. No atherosclerotic calcifications are noted in the thoracic aorta or the coronary arteries. Mediastinum/Nodes: Nasogastric tube extending into the distal stomach. No pathologically enlarged mediastinal or hilar lymph nodes. Esophagus is otherwise unremarkable in appearance. No axillary lymphadenopathy. Lungs/Pleura: Extensive areas of passive atelectasis are noted in the lower lobes of the lungs bilaterally. Some consolidative airspace disease also noted in the left lower lobe. Trace right pleural effusion. No left pleural effusion. Upper Abdomen: TIPS noted. Calcified granuloma in the right lobe of the liver incidentally noted. High attenuation material in the lumen of the gallbladder, either biliary sludge or vicarious excretion of contrast material from recent contrast-enhanced CT  examinations. Numerous embolization coils are noted in the upper abdomen. Musculoskeletal: There are no aggressive appearing lytic or blastic lesions noted in the visualized portions of the skeleton. Review of the MIP images confirms the above findings. IMPRESSION: 1. Study is positive for pulmonary emboli in segmental and subsegmental sized branches to the left lower lobe. This appears nearly completely occlusive in some vessels. 2. Extensive passive atelectasis in the lower lobes of the lungs bilaterally with some airspace consolidation in the  left lower lobe as well, which may reflect areas of alveolar hemorrhage in the setting of acute pulmonary infarct. 3. Trace right pleural effusion. 4. Support apparatus and postprocedural changes, as above. Electronically Signed   By: Trudie Reed M.D.   On: 07/28/2022 07:12   DG CHEST PORT 1 VIEW  Result Date: 07/28/2022 CLINICAL DATA:  Abdominal distension, vomiting EXAM: PORTABLE CHEST 1 VIEW COMPARISON:  07/27/2022 FINDINGS: Low lung volumes with bibasilar atelectasis. Heart and mediastinal contours are within normal limits. No effusions. No acute bony abnormality. Gaseous distention of the colon seen in the visualized upper abdomen. IMPRESSION: Low lung volumes, bibasilar atelectasis. Electronically Signed   By: Charlett Nose M.D.   On: 07/28/2022 01:43   DG Abd Portable 1V  Result Date: 07/28/2022 CLINICAL DATA:  Abdominal pain, distention, vomiting EXAM: PORTABLE ABDOMEN - 1 VIEW COMPARISON:  CT earlier tonight FINDINGS: Gaseous distension of the colon diffusely as seen on CT. No small bowel distention. No organomegaly or free air. Coils are seen from prior variceal embolization. IMPRESSION: Diffuse gaseous distention of the colon may reflect ileus. Electronically Signed   By: Charlett Nose M.D.   On: 07/28/2022 01:43   CT ABDOMEN PELVIS W CONTRAST  Result Date: 07/27/2022 CLINICAL DATA:  Abdominal pain, vomiting EXAM: CT ABDOMEN AND PELVIS WITH CONTRAST  TECHNIQUE: Multidetector CT imaging of the abdomen and pelvis was performed using the standard protocol following bolus administration of intravenous contrast. RADIATION DOSE REDUCTION: This exam was performed according to the departmental dose-optimization program which includes automated exposure control, adjustment of the mA and/or kV according to patient size and/or use of iterative reconstruction technique. CONTRAST:  75mL OMNIPAQUE IOHEXOL 350 MG/ML SOLN COMPARISON:  05/07/2022 FINDINGS: Lower chest: Incidentally noted partially occlusive thrombus in the left lower lobe pulmonary artery segmental branch. Bibasilar airspace opacities with air bronchograms could reflect atelectasis or pneumonia. No effusions. Hepatobiliary: tips stent in place. Numerous scattered subcentimeter hypodensities in the liver are stable, likely cysts. Gallbladder unremarkable. No biliary ductal dilatation. Pancreas: Coil embolization of varices in the pancreatic region. No focal abnormality or ductal dilatation. Spleen: No focal abnormality.  Normal size. Adrenals/Urinary Tract: No adrenal abnormality. No focal renal abnormality. No stones or hydronephrosis. Urinary bladder is unremarkable. Stomach/Bowel: Normal appendix. The colon is moderately dilated to the sigmoid colon where there is gradual tapering. No visible obstructing process or mass lesion. Air-fluid levels noted in the dilated colon. Stomach and small bowel decompressed, unremarkable. Vascular/Lymphatic: No evidence of aneurysm or adenopathy. Reproductive: No visible focal abnormality. Other: No free fluid or free air. Musculoskeletal: No acute bony abnormality. IMPRESSION: Incidentally noted partially occlusive pulmonary embolus in the left lower lobe. Full extent/involvement could be evaluated with CTA chest. Bilateral lower lobe airspace opacities with air bronchograms could reflect atelectasis or pneumonia. Gaseous distention of the colon to the sigmoid colon with  scattered air-fluid levels. No visible obstructing process. Findings may reflect adynamic ileus. Normal appendix. Prior tips and variceal coil embolization. Electronically Signed   By: Charlett Nose M.D.   On: 07/27/2022 21:11   DG Abdomen Acute W/Chest  Result Date: 07/27/2022 CLINICAL DATA:  Constipation EXAM: DG ABDOMEN ACUTE WITH 1 VIEW CHEST COMPARISON:  07/21/2022 FINDINGS: Diffusely gas-filled, distended colon, measuring up to 8.8 cm in caliber, with gas, stool, and fluid present to the rectum. No free air in the abdomen. TIPS projects over the right upper quadrant and coil material projects over the left hemiabdomen. Heart size and mediastinal contours are within normal limits. Both  lungs are clear. IMPRESSION: Diffusely gas-filled, distended colon, measuring up to 8.8 cm in caliber, with gas, stool, and fluid present to the rectum. Findings are most consistent with ileus. Electronically Signed   By: Jearld Lesch M.D.   On: 07/27/2022 16:40    Review of Systems  Gastrointestinal:  Positive for abdominal pain.  +BMs, OTW per HPI Blood pressure 136/84, pulse (!) 127, temperature 97.6 F (36.4 C), temperature source Oral, resp. rate 18, SpO2 94 %. Physical Exam HENT:     Head: Normocephalic.  Cardiovascular:     Rate and Rhythm: Normal rate and regular rhythm.  Pulmonary:     Effort: Pulmonary effort is normal.     Breath sounds: Normal breath sounds. No wheezing.  Abdominal:     General: There is distension.     Palpations: Abdomen is soft.     Tenderness: There is no abdominal tenderness.  Skin:    General: Skin is warm.  Neurological:     Mental Status: He is alert and oriented to person, place, and time.  Psychiatric:        Mood and Affect: Mood normal.     Assessment/Plan: S/P TIPS and embolization of varices - per IR Ileus - NGT in place, some BMs this AM. No acute surgical issues. Recommend GI to manage this ileus. They are involved in the care of his cirrhosis PE -  per primary  General Surgery will S.O.  Liz Malady 07/28/2022, 9:57 AM

## 2022-07-29 ENCOUNTER — Other Ambulatory Visit (HOSPITAL_COMMUNITY): Payer: Self-pay

## 2022-07-29 ENCOUNTER — Ambulatory Visit: Payer: BC Managed Care – PPO

## 2022-07-29 ENCOUNTER — Encounter: Payer: Self-pay | Admitting: Physician Assistant

## 2022-07-29 DIAGNOSIS — I2699 Other pulmonary embolism without acute cor pulmonale: Secondary | ICD-10-CM | POA: Diagnosis not present

## 2022-07-29 DIAGNOSIS — I8500 Esophageal varices without bleeding: Secondary | ICD-10-CM | POA: Diagnosis not present

## 2022-07-29 DIAGNOSIS — K766 Portal hypertension: Secondary | ICD-10-CM | POA: Diagnosis not present

## 2022-07-29 DIAGNOSIS — R1084 Generalized abdominal pain: Secondary | ICD-10-CM | POA: Diagnosis not present

## 2022-07-29 DIAGNOSIS — R109 Unspecified abdominal pain: Secondary | ICD-10-CM | POA: Diagnosis not present

## 2022-07-29 LAB — COMPREHENSIVE METABOLIC PANEL
ALT: 44 U/L (ref 0–44)
AST: 18 U/L (ref 15–41)
Albumin: 2.8 g/dL — ABNORMAL LOW (ref 3.5–5.0)
Alkaline Phosphatase: 82 U/L (ref 38–126)
Anion gap: 7 (ref 5–15)
BUN: 9 mg/dL (ref 6–20)
CO2: 23 mmol/L (ref 22–32)
Calcium: 8 mg/dL — ABNORMAL LOW (ref 8.9–10.3)
Chloride: 107 mmol/L (ref 98–111)
Creatinine, Ser: 0.93 mg/dL (ref 0.61–1.24)
GFR, Estimated: >60 mL/min (ref >60)
Glucose, Bld: 113 mg/dL — ABNORMAL HIGH (ref 70–99)
Potassium: 3.4 mmol/L — ABNORMAL LOW (ref 3.5–5.1)
Sodium: 137 mmol/L (ref 135–145)
Total Bilirubin: 0.7 mg/dL (ref 0.3–1.2)
Total Protein: 5.5 g/dL — ABNORMAL LOW (ref 6.5–8.1)

## 2022-07-29 LAB — CBC
HCT: 27.6 % — ABNORMAL LOW (ref 39.0–52.0)
Hemoglobin: 8.4 g/dL — ABNORMAL LOW (ref 13.0–17.0)
MCH: 25.1 pg — ABNORMAL LOW (ref 26.0–34.0)
MCHC: 30.4 g/dL (ref 30.0–36.0)
MCV: 82.6 fL (ref 80.0–100.0)
Platelets: 288 10*3/uL (ref 150–400)
RBC: 3.34 MIL/uL — ABNORMAL LOW (ref 4.22–5.81)
RDW: 17.1 % — ABNORMAL HIGH (ref 11.5–15.5)
WBC: 12.8 10*3/uL — ABNORMAL HIGH (ref 4.0–10.5)
nRBC: 0 % (ref 0.0–0.2)

## 2022-07-29 LAB — MAGNESIUM: Magnesium: 2.2 mg/dL (ref 1.7–2.4)

## 2022-07-29 LAB — HEPARIN LEVEL (UNFRACTIONATED): Heparin Unfractionated: 0.4 IU/mL (ref 0.30–0.70)

## 2022-07-29 LAB — CULTURE, BLOOD (ROUTINE X 2): Culture: NO GROWTH

## 2022-07-29 MED ORDER — LACTULOSE 10 GM/15ML PO SOLN: 30.0000 g | Freq: Every day | ORAL | Status: DC | PRN

## 2022-07-29 MED ORDER — DIPHENHYDRAMINE HCL 25 MG PO CAPS: 25.0000 mg | ORAL_CAPSULE | Freq: Once | ORAL | Status: DC

## 2022-07-29 MED ORDER — PANTOPRAZOLE SODIUM 40 MG PO TBEC
40.0000 mg | DELAYED_RELEASE_TABLET | Freq: Two times a day (BID) | ORAL | Status: DC
  Administered 2022-07-29 – 2022-07-31 (×4): 40 mg via ORAL
  Filled 2022-07-29 (×4): qty 1

## 2022-07-29 MED ORDER — ACETAMINOPHEN 325 MG PO TABS: 650.0000 mg | ORAL_TABLET | Freq: Once | ORAL | Status: DC

## 2022-07-29 MED ORDER — SODIUM CHLORIDE 0.9 % IV SOLN
200.0000 mg | Freq: Once | INTRAVENOUS | Status: DC
  Filled 2022-07-29: qty 10

## 2022-07-29 MED ORDER — POTASSIUM CHLORIDE 10 MEQ/100ML IV SOLN
10.0000 meq | INTRAVENOUS | Status: AC
  Administered 2022-07-29 (×4): 10 meq via INTRAVENOUS
  Filled 2022-07-29 (×4): qty 100

## 2022-07-29 NOTE — Progress Notes (Addendum)
PROGRESS NOTE  Nathan Rowland  WUJ:[id number] DOB: [birth year] DOA: 07/27/2022 PCP: Joaquim Nam, MD   Brief Narrative: Patient is a 42 year old male with history of cirrhotic portal hypertension, splenomegaly, portal vein thrombosis on Eliquis, hypertension esophageal varices who underwent TIPS, embolization of varices on 5/22 by IR presented with abdominal pain.  Patient was recently admitted for recurrent GI bleed and acute blood loss anemia and received several units of PRBC.  No report of hematochezia or melena.  Abdominal x-ray on presentation showed ileus.  Abdomen CT with contrast showed incidental finding of partially occlusive pulmonary emboli in the left lower lobe.  CT angiogram showed   pulmonary emboli in segmental and subsegmental sized branches to the left lower lobe,extensive passive atelectasis in the lower lobes of the lungs bilaterally with some airspace consolidation in the left lower lobe as well as areas of alveolar hemorrhage in the setting of acute pulmonary infarct.  General surgery /GI consulted for the finding of ileus.  Also started heparin drip.  Labs showed leukocytosis. Patient now with return of bowel function, NG tube removed today.  Assessment & Plan:  Principal Problem:   Abdominal pain Active Problems:   History of GI bleed   Ileus (HCC)   Pulmonary embolus (HCC)   S/P TIPS (transjugular intrahepatic portosystemic shunt)   Chronic anticoagulation   Abdominal pain/ileus: Presented with abdominal distention.  NG tube was placed , general surgery/GI  consulted. He is having multiple liquidy bowel movements.  Abdomen is soft and nondistended today.  Has bowel sounds.  NG tube removed.  Started on clear liquid diet.   Presented with leukocytosis, tachycardia, tachypnea.  This is most likely from PE.  Follow-up blood cultures.  Empirically started on Zosyn.  Will discontinue as soon as possible   PE: CT imagings as above.  Started on heparin drip.  GI  consulted for recommendation on anticoagulation.   Acute hypoxic respiratory failure: This is secondary to PE and also bilateral atelectasis, possible alveolar hemorrhage in the setting of acute pulmonary infarct.  Will continue to monitor respiratory status and  wean the oxygen as possible,weaned to 2 L today   Noncirrhotic portal hypertension/esophageal varices: Recent history of recurrent GI bleed.  Status post TIPS procedure on 5/22 by IR.  IR consulted here.  TIPS appears patent without evidence of procedural related complication.  Currently on Protonix.  GI consulted Monitor H&H  Hypokalemia: Supplemented with potassium.   Obesity: BMI 31        DVT prophylaxis:     Code Status: Full Code  Family Communication: Father and mother at bedside on 5/29  Patient status: Inpatient  Patient is from : Home  Anticipated discharge to: Home  Estimated DC date: 1 to 2 days   Consultants: GI  Procedures: None  Antimicrobials:  Anti-infectives (From admission, onward)    Start     Dose/Rate Route Frequency Ordered Stop   07/27/22 2300  piperacillin-tazobactam (ZOSYN) IVPB 3.375 g        3.375 g 12.5 mL/hr over 240 Minutes Intravenous Every 8 hours 07/27/22 2255         Subjective: Patient seen and examined at bedside today.  Hemodynamically stable.  Heart rate better today.  On 2 L oxygen this morning.  Denied worsening shortness of breath or cough.  He wants the NG tube to be removed.  He has multiple episodes of liquid bowel movement.  Denies any abdomen pain, nausea or vomiting   Objective: Vitals:  07/28/22 1944 07/28/22 2321 07/29/22 0349 07/29/22 0815  BP: 123/75 122/72 125/78 128/80  Pulse: (!) 113 (!) 114 (!) 105 (!) 103  Resp: 19 17 16 16   Temp: 98.5 F (36.9 C) 98.6 F (37 C) 98.2 F (36.8 C) 97.7 F (36.5 C)  TempSrc: Oral Oral Oral Oral  SpO2: 97% 96% 96% 97%    Intake/Output Summary (Last 24 hours) at 07/29/2022 1149 Last data filed at 07/29/2022  0430 Gross per 24 hour  Intake 1731.18 ml  Output 150 ml  Net 1581.18 ml   There were no vitals filed for this visit.  Examination:  General exam: Overall comfortable, not in distress HEENT: PERRL, NG tube Respiratory system: Diminished sounds bilaterally on bases, no wheezes or crackles  Cardiovascular system: S1 & S2 heard, RRR.  Gastrointestinal system: Abdomen is nondistended, soft and nontender.  Bowel sounds heard Central nervous system: Alert and oriented Extremities: No edema, no clubbing ,no cyanosis Skin: No rashes, no ulcers,no icterus     Data Reviewed: I have personally reviewed following labs and imaging studies  CBC: Recent Labs  Lab 07/24/22 1728 07/25/22 0519 07/27/22 1508 07/28/22 0232 07/29/22 0218  WBC 10.4 9.0 14.2* 24.6* 12.8*  HGB 8.9* 8.2* 10.4* 9.2* 8.4*  HCT 28.6* 26.1* 34.1* 29.3* 27.6*  MCV 82.7 83.7 84.8 81.2 82.6  PLT 198 187 337 323 288   Basic Metabolic Panel: Recent Labs  Lab 07/23/22 1224 07/24/22 0457 07/25/22 0519 07/27/22 1508 07/28/22 0232 07/29/22 0218  NA 138 137 138 135 135 137  K 3.5 3.5 3.6 4.1 4.1 3.4*  CL 106 108 107 106 105 107  CO2 24 22 22  21* 21* 23  GLUCOSE 107* 107* 105* 119* 138* 113*  BUN 10 8 9 6 8 9   CREATININE 0.80 0.78 0.88 0.88 0.86 0.93  CALCIUM 8.4* 8.2* 8.1* 9.0 8.6* 8.0*  MG 1.9  --   --   --  2.1 2.2  PHOS  --   --   --   --  3.5  --      Recent Results (from the past 240 hour(s))  Culture, blood (routine x 2)     Status: None (Preliminary result)   Collection Time: 07/27/22  9:40 PM   Specimen: BLOOD  Result Value Ref Range Status   Specimen Description BLOOD RIGHT ANTECUBITAL  Final   Special Requests   Final    BOTTLES DRAWN AEROBIC AND ANAEROBIC Blood Culture results may not be optimal due to an inadequate volume of blood received in culture bottles   Culture   Final    NO GROWTH 2 DAYS Performed at [facility], [address] [place], [place] [address]    Report Status  PENDING  Incomplete  Culture, blood (routine x 2)     Status: None (Preliminary result)   Collection Time: 07/27/22  9:51 PM   Specimen: BLOOD  Result Value Ref Range Status   Specimen Description BLOOD LEFT ANTECUBITAL  Final   Special Requests   Final    BOTTLES DRAWN AEROBIC AND ANAEROBIC Blood Culture adequate volume   Culture   Final    NO GROWTH 2 DAYS Performed at [facility], [address] [place], [place] [address]    Report Status PENDING  Incomplete     Radiology Studies: ECHOCARDIOGRAM COMPLETE  Result Date: 07/28/2022    ECHOCARDIOGRAM REPORT   Patient Name:   KYREECE LEHMAN Date of Exam: 07/28/2022 Medical Rec #:  [id number]         Height:       72.0 in Accession #:    [id number]        Weight:       229.0 lb Date of Birth:  December 02, 1980        BSA:          2.257 m Patient Age:    41 years          BP:           134/74 mmHg Patient Gender: M                 HR:           116 bpm. Exam Location:  Inpatient Procedure: 2D Echo, Cardiac Doppler and Color Doppler Indications:    Pulmonary Embolus  History:        Patient has no prior history of Echocardiogram examinations.                 Portal HTN, portal vein thrombosis; Signs/Symptoms:acute                 respiratory failure, abdominal pain, s/p TIPS 07/21/2022.  Sonographer:    Wallie Char Referring Phys: [id number] CAROLE N HALL IMPRESSIONS  1. Left ventricular ejection fraction, by estimation, is 65 to 70%. The left ventricle has normal function. The left ventricle has no regional wall motion abnormalities. Left ventricular diastolic parameters were normal.  2. Right ventricular systolic function is normal. The right ventricular size is mildly enlarged. There is normal pulmonary artery systolic pressure. The estimated right ventricular systolic pressure is 35.2 mmHg.  3. The mitral valve is normal in structure. No evidence of mitral valve regurgitation. No evidence of mitral stenosis.  4. The aortic valve was not well  visualized. Aortic valve regurgitation is not visualized.  5. The inferior vena cava is dilated in size with <50% respiratory variability, suggesting right atrial pressure of 15 mmHg. Comparison(s): No prior Echocardiogram. FINDINGS  Left Ventricle: Left ventricular ejection fraction, by estimation, is 65 to 70%. The left ventricle has normal function. The left ventricle has no regional wall motion abnormalities. The left ventricular internal cavity size was normal in size. There is  no left ventricular hypertrophy. Left ventricular diastolic parameters were normal. Right Ventricle: The right ventricular size is mildly enlarged. No increase in right ventricular wall thickness. Right ventricular systolic function is normal. There is normal pulmonary artery systolic pressure. The tricuspid regurgitant velocity is 2.25  m/s, and with an assumed right atrial pressure of 15 mmHg, the estimated right ventricular systolic pressure is 35.2 mmHg. Left Atrium: Left atrial size was normal in size. Right Atrium: Right atrial size was normal in size. Pericardium: There is no evidence of pericardial effusion. Mitral Valve: The mitral valve is normal in structure. No evidence of mitral valve regurgitation. No evidence of mitral valve stenosis. MV peak gradient, 5.7 mmHg. The mean mitral valve gradient is 3.0 mmHg. Tricuspid Valve: The tricuspid valve is normal in structure. Tricuspid valve regurgitation is trivial. Aortic Valve: The aortic valve was not well visualized. Aortic valve regurgitation is not visualized. Aortic valve mean gradient measures 4.5 mmHg. Aortic valve peak gradient measures 8.1 mmHg. Aortic valve area, by VTI measures 4.06 cm. Pulmonic Valve: The pulmonic valve was normal in structure. Pulmonic valve regurgitation is not visualized. No evidence of pulmonic stenosis. Aorta: The aortic root is normal in size and structure. Venous: The inferior vena cava is dilated  in size with less than 50% respiratory  variability, suggesting right atrial pressure of 15 mmHg. IAS/Shunts: The interatrial septum was not well visualized.  LEFT VENTRICLE PLAX 2D LVIDd:         5.00 cm      Diastology LVIDs:         3.60 cm      LV e' medial:    13.30 cm/s LV PW:         0.90 cm      LV E/e' medial:  7.1 LV IVS:        0.80 cm      LV e' lateral:   13.90 cm/s LVOT diam:     2.20 cm      LV E/e' lateral: 6.8 LV SV:         93 LV SV Index:   41 LVOT Area:     3.80 cm  LV Volumes (MOD) LV vol d, MOD A2C: 83.1 ml LV vol d, MOD A4C: 123.0 ml LV vol s, MOD A2C: 35.3 ml LV vol s, MOD A4C: 50.6 ml LV SV MOD A2C:     47.8 ml LV SV MOD A4C:     123.0 ml LV SV MOD BP:      60.8 ml RIGHT VENTRICLE             IVC RV Basal diam:  3.90 cm     IVC diam: 2.60 cm RV S prime:     16.70 cm/s TAPSE (M-mode): 2.1 cm LEFT ATRIUM             Index        RIGHT ATRIUM           Index LA diam:        3.90 cm 1.73 cm/m   RA Area:     12.70 cm LA Vol (A2C):   51.8 ml 22.95 ml/m  RA Volume:   28.80 ml  12.76 ml/m LA Vol (A4C):   62.0 ml 27.47 ml/m LA Biplane Vol: 60.1 ml 26.63 ml/m  AORTIC VALVE AV Area (Vmax):    3.87 cm AV Area (Vmean):   3.93 cm AV Area (VTI):     4.06 cm AV Vmax:           142.00 cm/s AV Vmean:          95.050 cm/s AV VTI:            0.229 m AV Peak Grad:      8.1 mmHg AV Mean Grad:      4.5 mmHg LVOT Vmax:         144.50 cm/s LVOT Vmean:        98.300 cm/s LVOT VTI:          0.244 m LVOT/AV VTI ratio: 1.07  AORTA Ao Root diam: 3.30 cm Ao Asc diam:  3.00 cm MITRAL VALVE                TRICUSPID VALVE MV Area (PHT): 5.27 cm     TR Peak grad:   20.2 mmHg MV Area VTI:   5.23 cm     TR Vmax:        225.00 cm/s MV Peak grad:  5.7 mmHg MV Mean grad:  3.0 mmHg     SHUNTS MV Vmax:       1.19 m/s     Systemic VTI:  0.24 m MV Vmean:      81.1 cm/s  Systemic Diam: 2.20 cm MV Decel Time: 144 msec MV E velocity: 94.60 cm/s MV A velocity: 120.00 cm/s MV E/A ratio:  0.79 Riley Lam MD Electronically signed by Riley Lam MD  Signature Date/Time: 07/28/2022/12:28:08 PM    Final    DG Abd 1 View  Result Date: 07/28/2022 CLINICAL DATA:  Enteric tube placement EXAM: ABDOMEN - 1 VIEW COMPARISON:  Abdominal radiograph dated 07/28/2022 at 1:34 a.m. FINDINGS: Gastric/enteric tube tip projects over the stomach. Similar diffuse gas-filled dilation of bowel loops. TIPS graft projects over the right upper quadrant. Multiple embolization coils in the left upper quadrant. IMPRESSION: Gastric/enteric tube tip projects over the stomach. Electronically Signed   By: Agustin Cree M.D.   On: 07/28/2022 08:47   CT Angio Chest PE W and/or Wo Contrast  Result Date: 07/28/2022 CLINICAL DATA:  41 year old male with shortness of breath. Suspected pulmonary embolism. EXAM: CT ANGIOGRAPHY CHEST WITH CONTRAST TECHNIQUE: Multidetector CT imaging of the chest was performed using the standard protocol during bolus administration of intravenous contrast. Multiplanar CT image reconstructions and MIPs were obtained to evaluate the vascular anatomy. RADIATION DOSE REDUCTION: This exam was performed according to the departmental dose-optimization program which includes automated exposure control, adjustment of the mA and/or kV according to patient size and/or use of iterative reconstruction technique. CONTRAST:  75mL OMNIPAQUE IOHEXOL 350 MG/ML SOLN COMPARISON:  No priors. FINDINGS: Cardiovascular: Large filling defect in segmental and subsegmental sized pulmonary artery branches to the left lower lobe compatible with pulmonary embolism. This appears nearly occlusive at this time. No definite right-sided emboli are noted. Heart size is normal. Estimated left ventricular diameter of 46 mm. Estimated right ventricular diameter of 41 mm. RV to LV ratio of 0.89. No atherosclerotic calcifications are noted in the thoracic aorta or the coronary arteries. Mediastinum/Nodes: Nasogastric tube extending into the distal stomach. No pathologically enlarged mediastinal or hilar  lymph nodes. Esophagus is otherwise unremarkable in appearance. No axillary lymphadenopathy. Lungs/Pleura: Extensive areas of passive atelectasis are noted in the lower lobes of the lungs bilaterally. Some consolidative airspace disease also noted in the left lower lobe. Trace right pleural effusion. No left pleural effusion. Upper Abdomen: TIPS noted. Calcified granuloma in the right lobe of the liver incidentally noted. High attenuation material in the lumen of the gallbladder, either biliary sludge or vicarious excretion of contrast material from recent contrast-enhanced CT examinations. Numerous embolization coils are noted in the upper abdomen. Musculoskeletal: There are no aggressive appearing lytic or blastic lesions noted in the visualized portions of the skeleton. Review of the MIP images confirms the above findings. IMPRESSION: 1. Study is positive for pulmonary emboli in segmental and subsegmental sized branches to the left lower lobe. This appears nearly completely occlusive in some vessels. 2. Extensive passive atelectasis in the lower lobes of the lungs bilaterally with some airspace consolidation in the left lower lobe as well, which may reflect areas of alveolar hemorrhage in the setting of acute pulmonary infarct. 3. Trace right pleural effusion. 4. Support apparatus and postprocedural changes, as above. Electronically Signed   By: Trudie Reed M.D.   On: 07/28/2022 07:12   DG CHEST PORT 1 VIEW  Result Date: 07/28/2022 CLINICAL DATA:  Abdominal distension, vomiting EXAM: PORTABLE CHEST 1 VIEW COMPARISON:  07/27/2022 FINDINGS: Low lung volumes with bibasilar atelectasis. Heart and mediastinal contours are within normal limits. No effusions. No acute bony abnormality. Gaseous distention of the colon seen in the visualized upper abdomen. IMPRESSION: Low lung volumes, bibasilar atelectasis. Electronically Signed  By: Charlett Nose M.D.   On: 07/28/2022 01:43   DG Abd Portable 1V  Result  Date: 07/28/2022 CLINICAL DATA:  Abdominal pain, distention, vomiting EXAM: PORTABLE ABDOMEN - 1 VIEW COMPARISON:  CT earlier tonight FINDINGS: Gaseous distension of the colon diffusely as seen on CT. No small bowel distention. No organomegaly or free air. Coils are seen from prior variceal embolization. IMPRESSION: Diffuse gaseous distention of the colon may reflect ileus. Electronically Signed   By: Charlett Nose M.D.   On: 07/28/2022 01:43   CT ABDOMEN PELVIS W CONTRAST  Result Date: 07/27/2022 CLINICAL DATA:  Abdominal pain, vomiting EXAM: CT ABDOMEN AND PELVIS WITH CONTRAST TECHNIQUE: Multidetector CT imaging of the abdomen and pelvis was performed using the standard protocol following bolus administration of intravenous contrast. RADIATION DOSE REDUCTION: This exam was performed according to the departmental dose-optimization program which includes automated exposure control, adjustment of the mA and/or kV according to patient size and/or use of iterative reconstruction technique. CONTRAST:  75mL OMNIPAQUE IOHEXOL 350 MG/ML SOLN COMPARISON:  05/07/2022 FINDINGS: Lower chest: Incidentally noted partially occlusive thrombus in the left lower lobe pulmonary artery segmental branch. Bibasilar airspace opacities with air bronchograms could reflect atelectasis or pneumonia. No effusions. Hepatobiliary: tips stent in place. Numerous scattered subcentimeter hypodensities in the liver are stable, likely cysts. Gallbladder unremarkable. No biliary ductal dilatation. Pancreas: Coil embolization of varices in the pancreatic region. No focal abnormality or ductal dilatation. Spleen: No focal abnormality.  Normal size. Adrenals/Urinary Tract: No adrenal abnormality. No focal renal abnormality. No stones or hydronephrosis. Urinary bladder is unremarkable. Stomach/Bowel: Normal appendix. The colon is moderately dilated to the sigmoid colon where there is gradual tapering. No visible obstructing process or mass lesion.  Air-fluid levels noted in the dilated colon. Stomach and small bowel decompressed, unremarkable. Vascular/Lymphatic: No evidence of aneurysm or adenopathy. Reproductive: No visible focal abnormality. Other: No free fluid or free air. Musculoskeletal: No acute bony abnormality. IMPRESSION: Incidentally noted partially occlusive pulmonary embolus in the left lower lobe. Full extent/involvement could be evaluated with CTA chest. Bilateral lower lobe airspace opacities with air bronchograms could reflect atelectasis or pneumonia. Gaseous distention of the colon to the sigmoid colon with scattered air-fluid levels. No visible obstructing process. Findings may reflect adynamic ileus. Normal appendix. Prior tips and variceal coil embolization. Electronically Signed   By: Charlett Nose M.D.   On: 07/27/2022 21:11   DG Abdomen Acute W/Chest  Result Date: 07/27/2022 CLINICAL DATA:  Constipation EXAM: DG ABDOMEN ACUTE WITH 1 VIEW CHEST COMPARISON:  07/21/2022 FINDINGS: Diffusely gas-filled, distended colon, measuring up to 8.8 cm in caliber, with gas, stool, and fluid present to the rectum. No free air in the abdomen. TIPS projects over the right upper quadrant and coil material projects over the left hemiabdomen. Heart size and mediastinal contours are within normal limits. Both lungs are clear. IMPRESSION: Diffusely gas-filled, distended colon, measuring up to 8.8 cm in caliber, with gas, stool, and fluid present to the rectum. Findings are most consistent with ileus. Electronically Signed   By: Jearld Lesch M.D.   On: 07/27/2022 16:40    Scheduled Meds:  pantoprazole (PROTONIX) IV  40 mg Intravenous BID   Continuous Infusions:  heparin 2,000 Units/hr (07/29/22 0402)   piperacillin-tazobactam 3.375 g (07/29/22 0822)     LOS: 2 days   Burnadette Pop, MD [facility] P5/30/2024, 11:49 AM

## 2022-07-29 NOTE — Progress Notes (Addendum)
At the beginning of the shift, the nurse notice that while the NG tube was hooked up to the suction and it was on intermittent, the machine was not coming on.  The nurse replaced the wall suction machine with a different one. After setting the machine to low intermittent suction it started working and drainage started coming from the NG tube.  Will continue to monitor.  Harriet Masson, RN

## 2022-07-29 NOTE — TOC Benefit Eligibility Note (Signed)
Patient Product/process development scientist completed.    The patient is currently admitted and upon discharge could be taking Eliquis 5 mg.  The current 30 day co-pay is $35.00.   The patient is currently admitted and upon discharge could be taking Xarelto 20 mg.  The current 30 day co-pay is $35.00.   The patient is insured through [employer]   This test claim was processed through [employer]- copay amounts may vary at other pharmacies due to [employer], or as the patient moves through the different stages of their insurance plan.  Nathan Rowland, CPHT Pharmacy Patient Advocate Specialist [facility] Patient Advocate Team Direct Number: [phone]  Fax: [phone]

## 2022-07-29 NOTE — Progress Notes (Signed)
ANTICOAGULATION CONSULT NOTE - Follow Up Consult  Pharmacy Consult for heparin Indication: pulmonary embolus / portal vein thrombus  Height: 6 ft. Weight :  103.9 kg IBW:  77.6 kg Heparin dosing weight: 99.1 kg  Labs: Recent Labs    07/27/22 1508 07/28/22 0231 07/28/22 0232 07/28/22 1231 07/28/22 2140 07/29/22 0218  HGB 10.4*  --  9.2*  --   --  8.4*  HCT 34.1*  --  29.3*  --   --  27.6*  PLT 337  --  323  --   --  288  HEPARINUNFRC  --    < >  --  0.21* 0.47 0.40  CREATININE 0.88  --  0.86  --   --  0.93   < > = values in this interval not displayed.     Assessment: 42yo male started  on  IV heparin infusion with initial dosing for PE with hx portal vein thrombus.  Heparin drip 2000 uts/hr with heparin level 0.4 at goal  No issues or interruptions with heparin infusion and no   bleeding noted per RN 's report.  Per GI note monitor 48-72hours on heparin to ensure  no bleeding complications and tolerating diet after NGT removed   Goal of Therapy:  Heparin level 0.3-0.7 units/ml   Plan:  Continue heparin infusion 2000 units/hr. Daily HL and CBC     Leota Sauers Pharm.D. CPP, BCPS Clinical Pharmacist [phone] 07/29/2022 10:00 AM

## 2022-07-29 NOTE — Progress Notes (Signed)
Progress Note  Primary GI: Dr. Myrtie Neither  LOS: 2 days   Chief Complaint: ileus   Subjective   Patient states "he feels like a new man." Reports significant improvement in abdominal pain. Denies nausea/vomiting. Not passing gas, though having multiple liquid bowel movements. NG tube was removed and he is tolerating clears without difficulty.    Objective   Vital signs in last 24 hours: Temp:  [97.7 F (36.5 C)-98.6 F (37 C)] 97.7 F (36.5 C) (05/30 0815) Pulse Rate:  [103-116] 103 (05/30 0815) Resp:  [16-19] 16 (05/30 0815) BP: (122-130)/(72-80) 128/80 (05/30 0815) SpO2:  [96 %-97 %] 97 % (05/30 0815) Last BM Date : 07/28/22 Last BM recorded by nurses in past 5 days Stool Type: Type 6 (Mushy consistency with ragged edges) (07/28/2022  7:44 PM)  General:   male in no acute distress  Heart:  Regular rate and rhythm; no murmurs Pulm: Clear anteriorly; no wheezing Abdomen: soft, improvement in distention, nontender, active bowel sounds Extremities:  No edema Neurologic:  Alert and  oriented x4;  No focal deficits.  Psych:  Cooperative. Normal mood and affect.  Intake/Output from previous day: 05/29 0701 - 05/30 0700 In: 1731.2 [I.V.:1578.4; IV Piggyback:152.8] Out: 150 [Emesis/NG output:150] Intake/Output this shift: No intake/output data recorded.  Studies/Results: ECHOCARDIOGRAM COMPLETE  Result Date: 07/28/2022    ECHOCARDIOGRAM REPORT   Patient Name:   Nathan Rowland Date of Exam: 07/28/2022 Medical Rec #:  [id number]         Height:       72.0 in Accession #:    [id number]        Weight:       229.0 lb Date of Birth:  May 09, 1980        BSA:          2.257 m Patient Age:    41 years          BP:           134/74 mmHg Patient Gender: M                 HR:           116 bpm. Exam Location:  Inpatient Procedure: 2D Echo, Cardiac Doppler and Color Doppler Indications:    Pulmonary Embolus  History:        Patient has no prior history of Echocardiogram examinations.                  Portal HTN, portal vein thrombosis; Signs/Symptoms:acute                 respiratory failure, abdominal pain, s/p TIPS 07/21/2022.  Sonographer:    Wallie Char Referring Phys: [id number] CAROLE N HALL IMPRESSIONS  1. Left ventricular ejection fraction, by estimation, is 65 to 70%. The left ventricle has normal function. The left ventricle has no regional wall motion abnormalities. Left ventricular diastolic parameters were normal.  2. Right ventricular systolic function is normal. The right ventricular size is mildly enlarged. There is normal pulmonary artery systolic pressure. The estimated right ventricular systolic pressure is 35.2 mmHg.  3. The mitral valve is normal in structure. No evidence of mitral valve regurgitation. No evidence of mitral stenosis.  4. The aortic valve was not well visualized. Aortic valve regurgitation is not visualized.  5. The inferior vena cava is dilated in size with <50% respiratory variability, suggesting right atrial pressure of 15 mmHg. Comparison(s): No prior Echocardiogram. FINDINGS  Left Ventricle: Left  ventricular ejection fraction, by estimation, is 65 to 70%. The left ventricle has normal function. The left ventricle has no regional wall motion abnormalities. The left ventricular internal cavity size was normal in size. There is  no left ventricular hypertrophy. Left ventricular diastolic parameters were normal. Right Ventricle: The right ventricular size is mildly enlarged. No increase in right ventricular wall thickness. Right ventricular systolic function is normal. There is normal pulmonary artery systolic pressure. The tricuspid regurgitant velocity is 2.25  m/s, and with an assumed right atrial pressure of 15 mmHg, the estimated right ventricular systolic pressure is 35.2 mmHg. Left Atrium: Left atrial size was normal in size. Right Atrium: Right atrial size was normal in size. Pericardium: There is no evidence of pericardial effusion. Mitral Valve: The mitral  valve is normal in structure. No evidence of mitral valve regurgitation. No evidence of mitral valve stenosis. MV peak gradient, 5.7 mmHg. The mean mitral valve gradient is 3.0 mmHg. Tricuspid Valve: The tricuspid valve is normal in structure. Tricuspid valve regurgitation is trivial. Aortic Valve: The aortic valve was not well visualized. Aortic valve regurgitation is not visualized. Aortic valve mean gradient measures 4.5 mmHg. Aortic valve peak gradient measures 8.1 mmHg. Aortic valve area, by VTI measures 4.06 cm. Pulmonic Valve: The pulmonic valve was normal in structure. Pulmonic valve regurgitation is not visualized. No evidence of pulmonic stenosis. Aorta: The aortic root is normal in size and structure. Venous: The inferior vena cava is dilated in size with less than 50% respiratory variability, suggesting right atrial pressure of 15 mmHg. IAS/Shunts: The interatrial septum was not well visualized.  LEFT VENTRICLE PLAX 2D LVIDd:         5.00 cm      Diastology LVIDs:         3.60 cm      LV e' medial:    13.30 cm/s LV PW:         0.90 cm      LV E/e' medial:  7.1 LV IVS:        0.80 cm      LV e' lateral:   13.90 cm/s LVOT diam:     2.20 cm      LV E/e' lateral: 6.8 LV SV:         93 LV SV Index:   41 LVOT Area:     3.80 cm  LV Volumes (MOD) LV vol d, MOD A2C: 83.1 ml LV vol d, MOD A4C: 123.0 ml LV vol s, MOD A2C: 35.3 ml LV vol s, MOD A4C: 50.6 ml LV SV MOD A2C:     47.8 ml LV SV MOD A4C:     123.0 ml LV SV MOD BP:      60.8 ml RIGHT VENTRICLE             IVC RV Basal diam:  3.90 cm     IVC diam: 2.60 cm RV S prime:     16.70 cm/s TAPSE (M-mode): 2.1 cm LEFT ATRIUM             Index        RIGHT ATRIUM           Index LA diam:        3.90 cm 1.73 cm/m   RA Area:     12.70 cm LA Vol (A2C):   51.8 ml 22.95 ml/m  RA Volume:   28.80 ml  12.76 ml/m LA Vol (A4C):   62.0 ml 27.47 ml/m LA  Biplane Vol: 60.1 ml 26.63 ml/m  AORTIC VALVE AV Area (Vmax):    3.87 cm AV Area (Vmean):   3.93 cm AV Area (VTI):      4.06 cm AV Vmax:           142.00 cm/s AV Vmean:          95.050 cm/s AV VTI:            0.229 m AV Peak Grad:      8.1 mmHg AV Mean Grad:      4.5 mmHg LVOT Vmax:         144.50 cm/s LVOT Vmean:        98.300 cm/s LVOT VTI:          0.244 m LVOT/AV VTI ratio: 1.07  AORTA Ao Root diam: 3.30 cm Ao Asc diam:  3.00 cm MITRAL VALVE                TRICUSPID VALVE MV Area (PHT): 5.27 cm     TR Peak grad:   20.2 mmHg MV Area VTI:   5.23 cm     TR Vmax:        225.00 cm/s MV Peak grad:  5.7 mmHg MV Mean grad:  3.0 mmHg     SHUNTS MV Vmax:       1.19 m/s     Systemic VTI:  0.24 m MV Vmean:      81.1 cm/s    Systemic Diam: 2.20 cm MV Decel Time: 144 msec MV E velocity: 94.60 cm/s MV A velocity: 120.00 cm/s MV E/A ratio:  0.79 Riley Lam MD Electronically signed by Riley Lam MD Signature Date/Time: 07/28/2022/12:28:08 PM    Final    DG Abd 1 View  Result Date: 07/28/2022 CLINICAL DATA:  Enteric tube placement EXAM: ABDOMEN - 1 VIEW COMPARISON:  Abdominal radiograph dated 07/28/2022 at 1:34 a.m. FINDINGS: Gastric/enteric tube tip projects over the stomach. Similar diffuse gas-filled dilation of bowel loops. TIPS graft projects over the right upper quadrant. Multiple embolization coils in the left upper quadrant. IMPRESSION: Gastric/enteric tube tip projects over the stomach. Electronically Signed   By: Agustin Cree M.D.   On: 07/28/2022 08:47   CT Angio Chest PE W and/or Wo Contrast  Result Date: 07/28/2022 CLINICAL DATA:  42 year old male with shortness of breath. Suspected pulmonary embolism. EXAM: CT ANGIOGRAPHY CHEST WITH CONTRAST TECHNIQUE: Multidetector CT imaging of the chest was performed using the standard protocol during bolus administration of intravenous contrast. Multiplanar CT image reconstructions and MIPs were obtained to evaluate the vascular anatomy. RADIATION DOSE REDUCTION: This exam was performed according to the departmental dose-optimization program which includes automated  exposure control, adjustment of the mA and/or kV according to patient size and/or use of iterative reconstruction technique. CONTRAST:  75mL OMNIPAQUE IOHEXOL 350 MG/ML SOLN COMPARISON:  No priors. FINDINGS: Cardiovascular: Large filling defect in segmental and subsegmental sized pulmonary artery branches to the left lower lobe compatible with pulmonary embolism. This appears nearly occlusive at this time. No definite right-sided emboli are noted. Heart size is normal. Estimated left ventricular diameter of 46 mm. Estimated right ventricular diameter of 41 mm. RV to LV ratio of 0.89. No atherosclerotic calcifications are noted in the thoracic aorta or the coronary arteries. Mediastinum/Nodes: Nasogastric tube extending into the distal stomach. No pathologically enlarged mediastinal or hilar lymph nodes. Esophagus is otherwise unremarkable in appearance. No axillary lymphadenopathy. Lungs/Pleura: Extensive areas of passive atelectasis are noted in the lower lobes of  the lungs bilaterally. Some consolidative airspace disease also noted in the left lower lobe. Trace right pleural effusion. No left pleural effusion. Upper Abdomen: TIPS noted. Calcified granuloma in the right lobe of the liver incidentally noted. High attenuation material in the lumen of the gallbladder, either biliary sludge or vicarious excretion of contrast material from recent contrast-enhanced CT examinations. Numerous embolization coils are noted in the upper abdomen. Musculoskeletal: There are no aggressive appearing lytic or blastic lesions noted in the visualized portions of the skeleton. Review of the MIP images confirms the above findings. IMPRESSION: 1. Study is positive for pulmonary emboli in segmental and subsegmental sized branches to the left lower lobe. This appears nearly completely occlusive in some vessels. 2. Extensive passive atelectasis in the lower lobes of the lungs bilaterally with some airspace consolidation in the left lower  lobe as well, which may reflect areas of alveolar hemorrhage in the setting of acute pulmonary infarct. 3. Trace right pleural effusion. 4. Support apparatus and postprocedural changes, as above. Electronically Signed   By: Trudie Reed M.D.   On: 07/28/2022 07:12   DG CHEST PORT 1 VIEW  Result Date: 07/28/2022 CLINICAL DATA:  Abdominal distension, vomiting EXAM: PORTABLE CHEST 1 VIEW COMPARISON:  07/27/2022 FINDINGS: Low lung volumes with bibasilar atelectasis. Heart and mediastinal contours are within normal limits. No effusions. No acute bony abnormality. Gaseous distention of the colon seen in the visualized upper abdomen. IMPRESSION: Low lung volumes, bibasilar atelectasis. Electronically Signed   By: Charlett Nose M.D.   On: 07/28/2022 01:43   DG Abd Portable 1V  Result Date: 07/28/2022 CLINICAL DATA:  Abdominal pain, distention, vomiting EXAM: PORTABLE ABDOMEN - 1 VIEW COMPARISON:  CT earlier tonight FINDINGS: Gaseous distension of the colon diffusely as seen on CT. No small bowel distention. No organomegaly or free air. Coils are seen from prior variceal embolization. IMPRESSION: Diffuse gaseous distention of the colon may reflect ileus. Electronically Signed   By: Charlett Nose M.D.   On: 07/28/2022 01:43   CT ABDOMEN PELVIS W CONTRAST  Result Date: 07/27/2022 CLINICAL DATA:  Abdominal pain, vomiting EXAM: CT ABDOMEN AND PELVIS WITH CONTRAST TECHNIQUE: Multidetector CT imaging of the abdomen and pelvis was performed using the standard protocol following bolus administration of intravenous contrast. RADIATION DOSE REDUCTION: This exam was performed according to the departmental dose-optimization program which includes automated exposure control, adjustment of the mA and/or kV according to patient size and/or use of iterative reconstruction technique. CONTRAST:  75mL OMNIPAQUE IOHEXOL 350 MG/ML SOLN COMPARISON:  05/07/2022 FINDINGS: Lower chest: Incidentally noted partially occlusive thrombus in  the left lower lobe pulmonary artery segmental branch. Bibasilar airspace opacities with air bronchograms could reflect atelectasis or pneumonia. No effusions. Hepatobiliary: tips stent in place. Numerous scattered subcentimeter hypodensities in the liver are stable, likely cysts. Gallbladder unremarkable. No biliary ductal dilatation. Pancreas: Coil embolization of varices in the pancreatic region. No focal abnormality or ductal dilatation. Spleen: No focal abnormality.  Normal size. Adrenals/Urinary Tract: No adrenal abnormality. No focal renal abnormality. No stones or hydronephrosis. Urinary bladder is unremarkable. Stomach/Bowel: Normal appendix. The colon is moderately dilated to the sigmoid colon where there is gradual tapering. No visible obstructing process or mass lesion. Air-fluid levels noted in the dilated colon. Stomach and small bowel decompressed, unremarkable. Vascular/Lymphatic: No evidence of aneurysm or adenopathy. Reproductive: No visible focal abnormality. Other: No free fluid or free air. Musculoskeletal: No acute bony abnormality. IMPRESSION: Incidentally noted partially occlusive pulmonary embolus in the left lower lobe. Full  extent/involvement could be evaluated with CTA chest. Bilateral lower lobe airspace opacities with air bronchograms could reflect atelectasis or pneumonia. Gaseous distention of the colon to the sigmoid colon with scattered air-fluid levels. No visible obstructing process. Findings may reflect adynamic ileus. Normal appendix. Prior tips and variceal coil embolization. Electronically Signed   By: Charlett Nose M.D.   On: 07/27/2022 21:11   DG Abdomen Acute W/Chest  Result Date: 07/27/2022 CLINICAL DATA:  Constipation EXAM: DG ABDOMEN ACUTE WITH 1 VIEW CHEST COMPARISON:  07/21/2022 FINDINGS: Diffusely gas-filled, distended colon, measuring up to 8.8 cm in caliber, with gas, stool, and fluid present to the rectum. No free air in the abdomen. TIPS projects over the right  upper quadrant and coil material projects over the left hemiabdomen. Heart size and mediastinal contours are within normal limits. Both lungs are clear. IMPRESSION: Diffusely gas-filled, distended colon, measuring up to 8.8 cm in caliber, with gas, stool, and fluid present to the rectum. Findings are most consistent with ileus. Electronically Signed   By: Jearld Lesch M.D.   On: 07/27/2022 16:40    Lab Results: Recent Labs    07/27/22 1508 07/28/22 0232 07/29/22 0218  WBC 14.2* 24.6* 12.8*  HGB 10.4* 9.2* 8.4*  HCT 34.1* 29.3* 27.6*  PLT 337 323 288   BMET Recent Labs    07/27/22 1508 07/28/22 0232 07/29/22 0218  NA 135 135 137  K 4.1 4.1 3.4*  CL 106 105 107  CO2 21* 21* 23  GLUCOSE 119* 138* 113*  BUN 6 8 9   CREATININE 0.88 0.86 0.93  CALCIUM 9.0 8.6* 8.0*   LFT Recent Labs    07/29/22 0218  PROT 5.5*  ALBUMIN 2.8*  AST 18  ALT 44  ALKPHOS 82  BILITOT 0.7   PT/INR No results for input(s): "LABPROT", "INR" in the last 72 hours.   Scheduled Meds:  pantoprazole (PROTONIX) IV  40 mg Intravenous BID   Continuous Infusions:  heparin 2,000 Units/hr (07/29/22 0402)   piperacillin-tazobactam 3.375 g (07/29/22 1610)   potassium chloride        Patient profile:   42 y.o. 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  Mild portal hypertensive gastropathy seen as well s/p transplenic TIPS 5/22 presents for abdominal pain and found to have ileus   Impression/Plan:   Diffuse abdominal pain likely secondary to ileus Abdominal x-ray shows ileus NG tube removed Phosphorus 3.4 Magnesium 2.2 Lipase 33 Having multiple bowel movements this AM and feels signifcant improvement in abdominal pain. Clinically improved. - Continue to maintain magnesium above 2 and potassium at  4-4.5.  - continue to advance diet as tolerated - Dulcolax suppository prn - GI will likely sign off. Please call us back if any questions/concerns.   Noncirrhotic portal hypertension with varices with history of recurrent GI bleed s/p TIPS procedure by IR 07/21/22  - hgb 8.4, stable - normal BUN, Cr. - normal LFTs Hgb stable, no overt bleeding. TIPS appears patent without evidence of post procedure bleeding or complication. IR also consulted and following. - Continue protonix 40mg  IV BID - Appreciate IR following as well - can continue to monitor on heparin for 48 hrs and then transition to DOAC to minimize bleeding risk. - No indication for procedures at this time. Monitor for overt bleeding. If re-bleeding, will repeat EGD/capsule endoscopy - GI will likely  sign off. Please call us back if any questions/concerns.   PE - on heparin drip - CT angiogram showed pulmonary emboli in segmental and subsegmental sized branches to the left lower lobe,extensive passive atelectasis in the lower lobes of the lungs bilaterally with some airspace consolidation in the left lower lobe as well, which may reflect areas of alveolar hemorrhage in the setting of acute pulmonary infarct. - per primary team   SIRS -on Zosyn -WBC 24.6   Thank you for your kind consultation, we will continue to follow.   Brittnae Aschenbrenner Leanna Sato  07/29/2022, 12:00 PM

## 2022-07-30 DIAGNOSIS — R1084 Generalized abdominal pain: Secondary | ICD-10-CM | POA: Diagnosis not present

## 2022-07-30 LAB — CULTURE, BLOOD (ROUTINE X 2)
Culture: NO GROWTH
Special Requests: ADEQUATE

## 2022-07-30 LAB — CBC
HCT: 26.4 % — ABNORMAL LOW (ref 39.0–52.0)
Hemoglobin: 8.2 g/dL — ABNORMAL LOW (ref 13.0–17.0)
MCH: 25.8 pg — ABNORMAL LOW (ref 26.0–34.0)
MCHC: 31.1 g/dL (ref 30.0–36.0)
MCV: 83 fL (ref 80.0–100.0)
Platelets: 276 10*3/uL (ref 150–400)
RBC: 3.18 MIL/uL — ABNORMAL LOW (ref 4.22–5.81)
RDW: 16.5 % — ABNORMAL HIGH (ref 11.5–15.5)
WBC: 8.9 10*3/uL (ref 4.0–10.5)
nRBC: 0.2 % (ref 0.0–0.2)

## 2022-07-30 LAB — BASIC METABOLIC PANEL
Anion gap: 9 (ref 5–15)
BUN: 8 mg/dL (ref 6–20)
CO2: 21 mmol/L — ABNORMAL LOW (ref 22–32)
Calcium: 8 mg/dL — ABNORMAL LOW (ref 8.9–10.3)
Chloride: 106 mmol/L (ref 98–111)
Creatinine, Ser: 0.96 mg/dL (ref 0.61–1.24)
GFR, Estimated: >60 mL/min (ref >60)
Glucose, Bld: 114 mg/dL — ABNORMAL HIGH (ref 70–99)
Potassium: 3.4 mmol/L — ABNORMAL LOW (ref 3.5–5.1)
Sodium: 136 mmol/L (ref 135–145)

## 2022-07-30 LAB — HEPARIN LEVEL (UNFRACTIONATED): Heparin Unfractionated: 0.33 IU/mL (ref 0.30–0.70)

## 2022-07-30 MED ORDER — APIXABAN 5 MG PO TABS
10.0000 mg | ORAL_TABLET | Freq: Two times a day (BID) | ORAL | Status: DC
  Administered 2022-07-30 – 2022-07-31 (×3): 10 mg via ORAL
  Filled 2022-07-30 (×3): qty 2

## 2022-07-30 MED ORDER — POTASSIUM CHLORIDE CRYS ER 20 MEQ PO TBCR
40.0000 meq | EXTENDED_RELEASE_TABLET | Freq: Once | ORAL | Status: AC
  Administered 2022-07-30: 40 meq via ORAL
  Filled 2022-07-30: qty 2

## 2022-07-30 MED ORDER — AMOXICILLIN-POT CLAVULANATE 875-125 MG PO TABS
1.0000 | ORAL_TABLET | Freq: Two times a day (BID) | ORAL | Status: DC
  Administered 2022-07-30 – 2022-07-31 (×3): 1 via ORAL
  Filled 2022-07-30 (×3): qty 1

## 2022-07-30 MED ORDER — APIXABAN 5 MG PO TABS: 5.0000 mg | ORAL_TABLET | Freq: Two times a day (BID) | ORAL | Status: DC

## 2022-07-30 NOTE — TOC Transition Note (Signed)
Transition of Care [facility]) - CM/SW Discharge Note   Patient Details  Name: Nathan Rowland MRN: [id number] Date of Birth: 1980-03-16  Transition of Care [facility]) CM/SW Contact:  Kermit Balo, RN Phone Number: 07/30/2022, 3:24 PM   Clinical Narrative:    Pt will d/c home tomorrow. He was provided 30 day free card and $10 copay card for Eliquis.  Pt lives with parent at home.  He denies any issues with home medications. K He drives self as needed.  Pt has transportation home when d/ced.    Final next level of care: Home/Self Care Barriers to Discharge: No Barriers Identified   Patient Goals and CMS Choice      Discharge Placement                         Discharge Plan and Services Additional resources added to the After Visit Summary for                                       Social Determinants of Health (SDOH) Interventions SDOH Screenings   Food Insecurity: No Food Insecurity (07/11/2022)  Housing: Low Risk  (07/11/2022)  Transportation Needs: No Transportation Needs (07/11/2022)  Utilities: Not At Risk (07/11/2022)  Alcohol Screen: Low Risk  (05/25/2022)  Depression (PHQ2-9): Low Risk  (07/27/2022)  Financial Resource Strain: Patient Declined (05/25/2022)  Physical Activity: Unknown (05/25/2022)  Social Connections: Unknown (05/25/2022)  Stress: Patient Declined (05/25/2022)  Tobacco Use: Low Risk  (07/27/2022)     Readmission Risk Interventions     No data to display

## 2022-07-30 NOTE — Discharge Instructions (Signed)
Information on my medicine - ELIQUIS (apixaban)  This medication education was reviewed with me or my healthcare representative as part of my discharge preparation.  Why was Eliquis prescribed for you? Eliquis was prescribed to treat blood clots that may have been found in the veins of your legs (deep vein thrombosis) or in your lungs (pulmonary embolism) and to reduce the risk of them occurring again.  What do You need to know about Eliquis ? The starting dose is 10 mg (two 5 mg tablets) taken TWICE daily for the FIRST SEVEN (7) DAYS, then on August 03, 2022 (08-03-2022)  the dose is reduced to ONE 5 mg tablet taken TWICE daily.  Eliquis may be taken with or without food.   Try to take the dose about the same time in the morning and in the evening. If you have difficulty swallowing the tablet whole please discuss with your pharmacist how to take the medication safely.  Take Eliquis exactly as prescribed and DO NOT stop taking Eliquis without talking to the doctor who prescribed the medication.  Stopping may increase your risk of developing a new blood clot.  Refill your prescription before you run out.  After discharge, you should have regular check-up appointments with your healthcare provider that is prescribing your Eliquis.    What do you do if you miss a dose? If a dose of ELIQUIS is not taken at the scheduled time, take it as soon as possible on the same day and twice-daily administration should be resumed. The dose should not be doubled to make up for a missed dose.  Important Safety Information A possible side effect of Eliquis is bleeding. You should call your healthcare provider right away if you experience any of the following: Bleeding from an injury or your nose that does not stop. Unusual colored urine (red or dark brown) or unusual colored stools (red or black). Unusual bruising for unknown reasons. A serious fall or if you hit your head (even if there is no  bleeding).  Some medicines may interact with Eliquis and might increase your risk of bleeding or clotting while on Eliquis. To help avoid this, consult your healthcare provider or pharmacist prior to using any new prescription or non-prescription medications, including herbals, vitamins, non-steroidal anti-inflammatory drugs (NSAIDs) and supplements.  This website has more information on Eliquis (apixaban): http://[email or web address].com/eliquis/home

## 2022-07-30 NOTE — Progress Notes (Signed)
ANTICOAGULATION CONSULT NOTE - Follow Up Consult  Pharmacy Consult for heparin Indication: pulmonary embolus / portal vein thrombus  Height: 6 ft. Weight :  103.9 kg IBW:  77.6 kg Heparin dosing weight: 99.1 kg  Labs: Recent Labs    07/28/22 0232 07/28/22 1231 07/28/22 2140 07/29/22 0218 07/30/22 0057  HGB 9.2*  --   --  8.4* 8.2*  HCT 29.3*  --   --  27.6* 26.4*  PLT 323  --   --  288 276  HEPARINUNFRC  --    < > 0.47 0.40 0.33  CREATININE 0.86  --   --  0.93 0.96   < > = values in this interval not displayed.     Assessment: 41yo male started  on  IV heparin infusion with initial dosing for acute PE  per CT angio 07/19/22. (While off eliquis). He had been taking Eliquis for hx portal vein thrombus, however Eliquis had been held x 2 weeks due to acute GIB on prior hospitalization 07/12/22.  Last day of eliquis PTA was 07/11/22.  Currently on IV Heparin drip 2000 uts/hr with  therapeutic heparin level today for acute PE.   No issues or interruptions with heparin infusion  reported. No   bleeding reported.     Pharmacy consulted today 5/31 to transition to Elquis for acute PE.  He has received 3 days of therapeutic heparin and in setting of recent recurrent GI bleed, I will start Eliquis at 10mg  BID x 4 days instead of full 7 day period, then on 6/4  reduce to 5 mg BID.      Plan:  Discontinue IV  heparin ,and transition to Eliquis 10 mg bid x 4 days  (received 3 days therapeutic UFH) then on 6/4 reduce to 5mg  BID.  Monitor for s/sx of bleeding    Thank you for allowing pharmacy to be part of this patients care team.  Noah Delaine, RPh Clinical Pharmacist 07/30/2022 11:41 AM  Please check AMION for all [facility] Pharmacy phone numbers After 10:00 PM, call Main Pharmacy [phone]

## 2022-07-30 NOTE — Progress Notes (Signed)
PROGRESS NOTE  Nathan Rowland  ZOX:[id number] DOB: [birth year] DOA: 07/27/2022 PCP: Joaquim Nam, MD   Brief Narrative: Patient is a 42 year old male with history of cirrhotic portal hypertension, splenomegaly, portal vein thrombosis on Eliquis, hypertension esophageal varices who underwent TIPS, embolization of varices on 5/22 by IR presented with abdominal pain.  Patient was recently admitted for recurrent GI bleed and acute blood loss anemia and received several units of PRBC.  No report of hematochezia or melena.  Abdominal x-ray on presentation showed ileus.  Abdomen CT with contrast showed incidental finding of partially occlusive pulmonary emboli in the left lower lobe.  CT angiogram showed   pulmonary emboli in segmental and subsegmental sized branches to the left lower lobe,extensive passive atelectasis in the lower lobes of the lungs bilaterally with some airspace consolidation in the left lower lobe as well as areas of alveolar hemorrhage in the setting of acute pulmonary infarct.  General surgery /GI consulted for the finding of ileus.  Also started heparin drip.  Labs showed leukocytosis. Patient now with return of bowel function, NG tube removed.  Ileus has resolved, started on solid diet.  Plan for discharge tomorrow if hemoglobin remains stable on Eliquis  Assessment & Plan:  Principal Problem:   Abdominal pain Active Problems:   History of GI bleed   Ileus (HCC)   Pulmonary embolus (HCC)   S/P TIPS (transjugular intrahepatic portosystemic shunt)   Chronic anticoagulation   Abdominal pain/ileus: Presented with abdominal distention.  NG tube was placed , general surgery/GI  consulted. Ileus has resolved.  Patient having regular bowel movement   PE: CT imagings as above.  Started on heparin drip.  GI consulted for recommendation on anticoagulation.  Changed to Eliquis today.   Acute hypoxic respiratory failure: This is secondary to PE and also bilateral atelectasis,  possible alveolar hemorrhage in the setting of acute pulmonary infarct.  Weaned to room air.   Noncirrhotic portal hypertension/esophageal varices: Recent history of recurrent GI bleed.  Status post TIPS procedure on 5/22 by IR.  IR consulted here.  TIPS appears patent without evidence of procedural related complication.  Currently on Protonix.  GI consulted Hemoglobin range of 8 , check CBC tomorrow  Hypokalemia: Supplemented with potassium.   Obesity: BMI 31        DVT prophylaxis: apixaban (ELIQUIS) tablet 10 mg  apixaban (ELIQUIS) tablet 5 mg     Code Status: Full Code  Family Communication: Father and mother at bedside on 5/29  Patient status: Inpatient  Patient is from : Home  Anticipated discharge to: Home  Estimated DC date: tomorrow   Consultants: GI  Procedures: None  Antimicrobials:  Anti-infectives (From admission, onward)    Start     Dose/Rate Route Frequency Ordered Stop   07/30/22 1245  amoxicillin-clavulanate (AUGMENTIN) 875-125 MG per tablet 1 tablet        1 tablet Oral Every 12 hours 07/30/22 1148 08/03/22 0959   07/27/22 2300  piperacillin-tazobactam (ZOSYN) IVPB 3.375 g  Status:  Discontinued        3.375 g 12.5 mL/hr over 240 Minutes Intravenous Every 8 hours 07/27/22 2255 07/30/22 1148       Subjective: Patient seen and examined at bedside today.  Hemodynamically stable.  Comfortable.  On room air.  Denies any abdomen pain, nausea or vomiting.   Objective: Vitals:   07/29/22 2011 07/30/22 0004 07/30/22 0404 07/30/22 0800  BP: 119/68 111/72 108/68 126/72  Pulse: (!) 104 (!) 104 100 95  Resp: 16 20 20 16   Temp: 99.1 F (37.3 C) 98.1 F (36.7 C) 98.1 F (36.7 C) 98.1 F (36.7 C)  TempSrc: Oral Oral Oral Oral  SpO2: 93% 93% 90% 100%    Intake/Output Summary (Last 24 hours) at 07/30/2022 1149 Last data filed at 07/30/2022 0444 Gross per 24 hour  Intake 1363.68 ml  Output --  Net 1363.68 ml   There were no vitals filed for this  visit.  Examination:  General exam: Overall comfortable, not in distress HEENT: PERRL Respiratory system:  no wheezes or crackles  Cardiovascular system: S1 & S2 heard, RRR.  Gastrointestinal system: Abdomen is nondistended, soft and nontender. Central nervous system: Alert and oriented Extremities: No edema, no clubbing ,no cyanosis Skin: No rashes, no ulcers,no icterus     Data Reviewed: I have personally reviewed following labs and imaging studies  CBC: Recent Labs  Lab 07/25/22 0519 07/27/22 1508 07/28/22 0232 07/29/22 0218 07/30/22 0057  WBC 9.0 14.2* 24.6* 12.8* 8.9  HGB 8.2* 10.4* 9.2* 8.4* 8.2*  HCT 26.1* 34.1* 29.3* 27.6* 26.4*  MCV 83.7 84.8 81.2 82.6 83.0  PLT 187 337 323 288 276   Basic Metabolic Panel: Recent Labs  Lab 07/23/22 1224 07/24/22 0457 07/25/22 0519 07/27/22 1508 07/28/22 0232 07/29/22 0218 07/30/22 0057  NA 138   < > 138 135 135 137 136  K 3.5   < > 3.6 4.1 4.1 3.4* 3.4*  CL 106   < > 107 106 105 107 106  CO2 24   < > 22 21* 21* 23 21*  GLUCOSE 107*   < > 105* 119* 138* 113* 114*  BUN 10   < > 9 6 8 9 8   CREATININE 0.80   < > 0.88 0.88 0.86 0.93 0.96  CALCIUM 8.4*   < > 8.1* 9.0 8.6* 8.0* 8.0*  MG 1.9  --   --   --  2.1 2.2  --   PHOS  --   --   --   --  3.5  --   --    < > = values in this interval not displayed.     Recent Results (from the past 240 hour(s))  Culture, blood (routine x 2)     Status: None (Preliminary result)   Collection Time: 07/27/22  9:40 PM   Specimen: BLOOD  Result Value Ref Range Status   Specimen Description BLOOD RIGHT ANTECUBITAL  Final   Special Requests   Final    BOTTLES DRAWN AEROBIC AND ANAEROBIC Blood Culture results may not be optimal due to an inadequate volume of blood received in culture bottles   Culture   Final    NO GROWTH 3 DAYS Performed at [facility], [address] [place], [place] [address]    Report Status PENDING  Incomplete  Culture, blood (routine x 2)     Status:  None (Preliminary result)   Collection Time: 07/27/22  9:51 PM   Specimen: BLOOD  Result Value Ref Range Status   Specimen Description BLOOD LEFT ANTECUBITAL  Final   Special Requests   Final    BOTTLES DRAWN AEROBIC AND ANAEROBIC Blood Culture adequate volume   Culture   Final    NO GROWTH 3 DAYS Performed at [facility], [address], [place], [place] [address]    Report Status PENDING  Incomplete     Radiology Studies: No results found.  Scheduled Meds:  amoxicillin-clavulanate  1 tablet Oral Q12H  apixaban  10 mg Oral BID   Followed by   Melene Muller ON 08/03/2022] apixaban  5 mg Oral BID   pantoprazole  40 mg Oral BID   Continuous Infusions:     LOS: 3 days   Burnadette Pop, MD [facility] P5/31/2024, 11:49 AM

## 2022-07-31 ENCOUNTER — Other Ambulatory Visit (HOSPITAL_COMMUNITY): Payer: Self-pay

## 2022-07-31 ENCOUNTER — Encounter: Payer: Self-pay | Admitting: Physician Assistant

## 2022-07-31 DIAGNOSIS — R1084 Generalized abdominal pain: Secondary | ICD-10-CM | POA: Diagnosis not present

## 2022-07-31 LAB — CBC
HCT: 27.1 % — ABNORMAL LOW (ref 39.0–52.0)
Hemoglobin: 8.4 g/dL — ABNORMAL LOW (ref 13.0–17.0)
MCH: 26 pg (ref 26.0–34.0)
MCHC: 31 g/dL (ref 30.0–36.0)
MCV: 83.9 fL (ref 80.0–100.0)
Platelets: 326 10*3/uL (ref 150–400)
RBC: 3.23 MIL/uL — ABNORMAL LOW (ref 4.22–5.81)
RDW: 16.5 % — ABNORMAL HIGH (ref 11.5–15.5)
WBC: 7 10*3/uL (ref 4.0–10.5)
nRBC: 0 % (ref 0.0–0.2)

## 2022-07-31 LAB — CULTURE, BLOOD (ROUTINE X 2)

## 2022-07-31 LAB — BASIC METABOLIC PANEL
Anion gap: 10 (ref 5–15)
BUN: 6 mg/dL (ref 6–20)
CO2: 23 mmol/L (ref 22–32)
Calcium: 8.3 mg/dL — ABNORMAL LOW (ref 8.9–10.3)
Chloride: 106 mmol/L (ref 98–111)
Creatinine, Ser: 0.86 mg/dL (ref 0.61–1.24)
GFR, Estimated: >60 mL/min (ref >60)
Glucose, Bld: 113 mg/dL — ABNORMAL HIGH (ref 70–99)
Potassium: 3.5 mmol/L (ref 3.5–5.1)
Sodium: 139 mmol/L (ref 135–145)

## 2022-07-31 MED ORDER — POTASSIUM CHLORIDE CRYS ER 20 MEQ PO TBCR
40.0000 meq | EXTENDED_RELEASE_TABLET | Freq: Once | ORAL | Status: AC
  Administered 2022-07-31: 40 meq via ORAL
  Filled 2022-07-31: qty 2

## 2022-07-31 MED ORDER — APIXABAN 5 MG PO TABS
ORAL_TABLET | ORAL | 0 refills | Status: DC
  Filled 2022-07-31: qty 10, 3d supply, fill #0
  Filled 2022-08-02: qty 64, 30d supply, fill #0

## 2022-07-31 MED ORDER — PANTOPRAZOLE SODIUM 40 MG PO TBEC
40.0000 mg | DELAYED_RELEASE_TABLET | Freq: Two times a day (BID) | ORAL | 0 refills | Status: DC
  Filled 2022-07-31: qty 60, 30d supply, fill #0

## 2022-07-31 MED ORDER — APIXABAN 5 MG PO TABS
5.0000 mg | ORAL_TABLET | Freq: Two times a day (BID) | ORAL | 0 refills | Status: DC
  Filled 2022-07-31: qty 60, 30d supply, fill #0

## 2022-07-31 MED ORDER — AMOXICILLIN-POT CLAVULANATE 875-125 MG PO TABS
1.0000 | ORAL_TABLET | Freq: Two times a day (BID) | ORAL | 0 refills | Status: AC
  Filled 2022-07-31: qty 6, 3d supply, fill #0

## 2022-07-31 NOTE — Discharge Summary (Signed)
Physician Discharge Summary  Nathan Rowland:[id number] DOB: 06/17/1980 DOA: 07/27/2022  PCP: Joaquim Nam, MD  Admit date: 07/27/2022 Discharge date: 07/31/2022  Admitted From: Home Disposition:  Home  Discharge Condition:Stable CODE STATUS:FULL, Diet recommendation: regular   Brief/Interim Summary: Arville Lime is a 42 year old male with history of cirrhotic portal hypertension, splenomegaly, portal vein thrombosis on Eliquis, hypertension esophageal varices who underwent TIPS, embolization of varices on 5/22 by IR presented with abdominal pain.  Patient was recently admitted for recurrent GI bleed and acute blood loss anemia and received several units of PRBC.  No report of hematochezia or melena.  Abdominal x-ray on presentation showed ileus.  Abdomen CT with contrast showed incidental finding of partially occlusive pulmonary emboli in the left lower lobe.  CT angiogram showed   pulmonary emboli in segmental and subsegmental sized branches to the left lower lobe,extensive passive atelectasis in the lower lobes of the lungs bilaterally with some airspace consolidation in the left lower lobe as well as areas of alveolar hemorrhage in the setting of acute pulmonary infarct.  General surgery /GI consulted for the finding of ileus.  Also started heparin drip.  Labs showed leukocytosis. Patient now with return of bowel function, NG tube removed.  Ileus has resolved, started on solid diet and he is tolerating.  IV heparin has been changed to Eliquis and his hemoglobin has remained stable in the range of 8.  Medically stable for discharge to home today  Following problems were addressed during the hospitalization:  Abdominal pain/ileus: Presented with abdominal distention.  NG tube was placed , general surgery/GI  consulted. Ileus has resolved.  Patient having regular bowel movement   PE: CT imagings as above.  Started on heparin drip.  Changed to Eliquis    Acute hypoxic respiratory failure:  This is secondary to PE and also bilateral atelectasis, possible alveolar hemorrhage in the setting of acute pulmonary infarct.  Weaned to room air.   Noncirrhotic portal hypertension/esophageal varices: Recent history of recurrent GI bleed.  Status post TIPS procedure on 5/22 by IR.  IR consulted here.  TIPS appears patent without evidence of procedural related complication.  Currently on Protonix.  GI consulted Hemoglobin in the range of 8 , check CBC in a week   Hypokalemia: Supplemented with potassium.   Obesity: BMI 31   Discharge Diagnoses:  Principal Problem:   Abdominal pain Active Problems:   History of GI bleed   Ileus (HCC)   Pulmonary embolus (HCC)   S/P TIPS (transjugular intrahepatic portosystemic shunt)   Chronic anticoagulation    Discharge Instructions  Discharge Instructions     Diet general   Complete by: As directed    Discharge instructions   Complete by: As directed    1)Please take prescribed medications as instructed 2)Follow up with your PCP, gastroenterology, IR as an outpatient.  Do a CBC, BMP testing a week 3)Avoid strenuous exercise for next 2 weeks   Increase activity slowly   Complete by: As directed       Allergies as of 07/31/2022       Reactions   Pollen Extract     Allergy injection        Medication List     STOP taking these medications    midodrine 10 MG tablet Commonly known as: PROAMATINE       TAKE these medications    amoxicillin-clavulanate 875-125 MG tablet Commonly known as: AUGMENTIN Take 1 tablet by mouth every 12 (twelve) hours for 3 days.  apixaban 5 MG Tabs tablet Commonly known as: ELIQUIS Take 2 tablets (10 mg total) by mouth 2 (two) times daily for 3 days.   apixaban 5 MG Tabs tablet Commonly known as: ELIQUIS Take 1 tablet (5 mg total) by mouth 2 (two) times daily. Start taking on: August 03, 2022   EPINEPHrine 0.3 mg/0.3 mL Soaj injection Commonly known as: EPI-PEN Inject 0.3 mg into the  muscle once as needed for anaphylaxis.   folic acid 1 MG tablet Commonly known as: FOLVITE Take 1 tablet (1 mg total) by mouth daily.   HYDROcodone-acetaminophen 5-325 MG tablet Commonly known as: NORCO/VICODIN Take 1 tablet by mouth every 8 (eight) hours as needed for moderate pain.   Iron (Ferrous Sulfate) 325 (65 Fe) MG Tabs Take 325 mg by mouth daily.   lactulose 10 GM/15ML solution Commonly known as: CHRONULAC Take 22.5 mLs (15 g total) by mouth 2 (two) times daily as needed for mild constipation.   pantoprazole 40 MG tablet Commonly known as: PROTONIX Take 1 tablet (40 mg total) by mouth 2 (two) times daily.        Follow-up Information     Joaquim Nam, MD. Schedule an appointment as soon as possible for a visit in 1 week(s).   Specialty: Family Medicine Contact information: [address] [place] [address] [phone]                Allergies  Allergen Reactions   Pollen Extract      Allergy injection    Consultations: GI, IR   Procedures/Studies: ECHOCARDIOGRAM COMPLETE  Result Date: 07/28/2022    ECHOCARDIOGRAM REPORT   Patient Name:   Nathan Rowland Date of Exam: 07/28/2022 Medical Rec #:  [id number]         Height:       72.0 in Accession #:    [id number]        Weight:       229.0 lb Date of Birth:  12/02/1980        BSA:          2.257 m Patient Age:    42 years          BP:           134/74 mmHg Patient Gender: M                 HR:           116 bpm. Exam Location:  Inpatient Procedure: 2D Echo, Cardiac Doppler and Color Doppler Indications:    Pulmonary Embolus  History:        Patient has no prior history of Echocardiogram examinations.                 Portal HTN, portal vein thrombosis; Signs/Symptoms:acute                 respiratory failure, abdominal pain, s/p TIPS 07/21/2022.  Sonographer:    Wallie Char Referring Phys: [id number] CAROLE N HALL IMPRESSIONS  1. Left ventricular ejection fraction, by estimation, is 65 to  70%. The left ventricle has normal function. The left ventricle has no regional wall motion abnormalities. Left ventricular diastolic parameters were normal.  2. Right ventricular systolic function is normal. The right ventricular size is mildly enlarged. There is normal pulmonary artery systolic pressure. The estimated right ventricular systolic pressure is 35.2 mmHg.  3. The mitral valve is normal in structure. No evidence of mitral valve regurgitation. No evidence  of mitral stenosis.  4. The aortic valve was not well visualized. Aortic valve regurgitation is not visualized.  5. The inferior vena cava is dilated in size with <50% respiratory variability, suggesting right atrial pressure of 15 mmHg. Comparison(s): No prior Echocardiogram. FINDINGS  Left Ventricle: Left ventricular ejection fraction, by estimation, is 65 to 70%. The left ventricle has normal function. The left ventricle has no regional wall motion abnormalities. The left ventricular internal cavity size was normal in size. There is  no left ventricular hypertrophy. Left ventricular diastolic parameters were normal. Right Ventricle: The right ventricular size is mildly enlarged. No increase in right ventricular wall thickness. Right ventricular systolic function is normal. There is normal pulmonary artery systolic pressure. The tricuspid regurgitant velocity is 2.25  m/s, and with an assumed right atrial pressure of 15 mmHg, the estimated right ventricular systolic pressure is 35.2 mmHg. Left Atrium: Left atrial size was normal in size. Right Atrium: Right atrial size was normal in size. Pericardium: There is no evidence of pericardial effusion. Mitral Valve: The mitral valve is normal in structure. No evidence of mitral valve regurgitation. No evidence of mitral valve stenosis. MV peak gradient, 5.7 mmHg. The mean mitral valve gradient is 3.0 mmHg. Tricuspid Valve: The tricuspid valve is normal in structure. Tricuspid valve regurgitation is trivial.  Aortic Valve: The aortic valve was not well visualized. Aortic valve regurgitation is not visualized. Aortic valve mean gradient measures 4.5 mmHg. Aortic valve peak gradient measures 8.1 mmHg. Aortic valve area, by VTI measures 4.06 cm. Pulmonic Valve: The pulmonic valve was normal in structure. Pulmonic valve regurgitation is not visualized. No evidence of pulmonic stenosis. Aorta: The aortic root is normal in size and structure. Venous: The inferior vena cava is dilated in size with less than 50% respiratory variability, suggesting right atrial pressure of 15 mmHg. IAS/Shunts: The interatrial septum was not well visualized.  LEFT VENTRICLE PLAX 2D LVIDd:         5.00 cm      Diastology LVIDs:         3.60 cm      LV e' medial:    13.30 cm/s LV PW:         0.90 cm      LV E/e' medial:  7.1 LV IVS:        0.80 cm      LV e' lateral:   13.90 cm/s LVOT diam:     2.20 cm      LV E/e' lateral: 6.8 LV SV:         93 LV SV Index:   41 LVOT Area:     3.80 cm  LV Volumes (MOD) LV vol d, MOD A2C: 83.1 ml LV vol d, MOD A4C: 123.0 ml LV vol s, MOD A2C: 35.3 ml LV vol s, MOD A4C: 50.6 ml LV SV MOD A2C:     47.8 ml LV SV MOD A4C:     123.0 ml LV SV MOD BP:      60.8 ml RIGHT VENTRICLE             IVC RV Basal diam:  3.90 cm     IVC diam: 2.60 cm RV S prime:     16.70 cm/s TAPSE (M-mode): 2.1 cm LEFT ATRIUM             Index        RIGHT ATRIUM           Index LA diam:  3.90 cm 1.73 cm/m   RA Area:     12.70 cm LA Vol (A2C):   51.8 ml 22.95 ml/m  RA Volume:   28.80 ml  12.76 ml/m LA Vol (A4C):   62.0 ml 27.47 ml/m LA Biplane Vol: 60.1 ml 26.63 ml/m  AORTIC VALVE AV Area (Vmax):    3.87 cm AV Area (Vmean):   3.93 cm AV Area (VTI):     4.06 cm AV Vmax:           142.00 cm/s AV Vmean:          95.050 cm/s AV VTI:            0.229 m AV Peak Grad:      8.1 mmHg AV Mean Grad:      4.5 mmHg LVOT Vmax:         144.50 cm/s LVOT Vmean:        98.300 cm/s LVOT VTI:          0.244 m LVOT/AV VTI ratio: 1.07  AORTA Ao Root  diam: 3.30 cm Ao Asc diam:  3.00 cm MITRAL VALVE                TRICUSPID VALVE MV Area (PHT): 5.27 cm     TR Peak grad:   20.2 mmHg MV Area VTI:   5.23 cm     TR Vmax:        225.00 cm/s MV Peak grad:  5.7 mmHg MV Mean grad:  3.0 mmHg     SHUNTS MV Vmax:       1.19 m/s     Systemic VTI:  0.24 m MV Vmean:      81.1 cm/s    Systemic Diam: 2.20 cm MV Decel Time: 144 msec MV E velocity: 94.60 cm/s MV A velocity: 120.00 cm/s MV E/A ratio:  0.79 Nathan Lam MD Electronically signed by Nathan Lam MD Signature Date/Time: 07/28/2022/12:28:08 PM    Final    DG Abd 1 View  Result Date: 07/28/2022 CLINICAL DATA:  Enteric tube placement EXAM: ABDOMEN - 1 VIEW COMPARISON:  Abdominal radiograph dated 07/28/2022 at 1:34 a.m. FINDINGS: Gastric/enteric tube tip projects over the stomach. Similar diffuse gas-filled dilation of bowel loops. TIPS graft projects over the right upper quadrant. Multiple embolization coils in the left upper quadrant. IMPRESSION: Gastric/enteric tube tip projects over the stomach. Electronically Signed   By: Agustin Cree M.D.   On: 07/28/2022 08:47   CT Angio Chest PE W and/or Wo Contrast  Result Date: 07/28/2022 CLINICAL DATA:  42 year old male with shortness of breath. Suspected pulmonary embolism. EXAM: CT ANGIOGRAPHY CHEST WITH CONTRAST TECHNIQUE: Multidetector CT imaging of the chest was performed using the standard protocol during bolus administration of intravenous contrast. Multiplanar CT image reconstructions and MIPs were obtained to evaluate the vascular anatomy. RADIATION DOSE REDUCTION: This exam was performed according to the departmental dose-optimization program which includes automated exposure control, adjustment of the mA and/or kV according to patient size and/or use of iterative reconstruction technique. CONTRAST:  75mL OMNIPAQUE IOHEXOL 350 MG/ML SOLN COMPARISON:  No priors. FINDINGS: Cardiovascular: Large filling defect in segmental and subsegmental sized  pulmonary artery branches to the left lower lobe compatible with pulmonary embolism. This appears nearly occlusive at this time. No definite right-sided emboli are noted. Heart size is normal. Estimated left ventricular diameter of 46 mm. Estimated right ventricular diameter of 41 mm. RV to LV ratio of 0.89. No atherosclerotic calcifications are noted in the thoracic  aorta or the coronary arteries. Mediastinum/Nodes: Nasogastric tube extending into the distal stomach. No pathologically enlarged mediastinal or hilar lymph nodes. Esophagus is otherwise unremarkable in appearance. No axillary lymphadenopathy. Lungs/Pleura: Extensive areas of passive atelectasis are noted in the lower lobes of the lungs bilaterally. Some consolidative airspace disease also noted in the left lower lobe. Trace right pleural effusion. No left pleural effusion. Upper Abdomen: TIPS noted. Calcified granuloma in the right lobe of the liver incidentally noted. High attenuation material in the lumen of the gallbladder, either biliary sludge or vicarious excretion of contrast material from recent contrast-enhanced CT examinations. Numerous embolization coils are noted in the upper abdomen. Musculoskeletal: There are no aggressive appearing lytic or blastic lesions noted in the visualized portions of the skeleton. Review of the MIP images confirms the above findings. IMPRESSION: 1. Study is positive for pulmonary emboli in segmental and subsegmental sized branches to the left lower lobe. This appears nearly completely occlusive in some vessels. 2. Extensive passive atelectasis in the lower lobes of the lungs bilaterally with some airspace consolidation in the left lower lobe as well, which may reflect areas of alveolar hemorrhage in the setting of acute pulmonary infarct. 3. Trace right pleural effusion. 4. Support apparatus and postprocedural changes, as above. Electronically Signed   By: Trudie Reed M.D.   On: 07/28/2022 07:12   DG CHEST  PORT 1 VIEW  Result Date: 07/28/2022 CLINICAL DATA:  Abdominal distension, vomiting EXAM: PORTABLE CHEST 1 VIEW COMPARISON:  07/27/2022 FINDINGS: Low lung volumes with bibasilar atelectasis. Heart and mediastinal contours are within normal limits. No effusions. No acute bony abnormality. Gaseous distention of the colon seen in the visualized upper abdomen. IMPRESSION: Low lung volumes, bibasilar atelectasis. Electronically Signed   By: Charlett Nose M.D.   On: 07/28/2022 01:43   DG Abd Portable 1V  Result Date: 07/28/2022 CLINICAL DATA:  Abdominal pain, distention, vomiting EXAM: PORTABLE ABDOMEN - 1 VIEW COMPARISON:  CT earlier tonight FINDINGS: Gaseous distension of the colon diffusely as seen on CT. No small bowel distention. No organomegaly or free air. Coils are seen from prior variceal embolization. IMPRESSION: Diffuse gaseous distention of the colon may reflect ileus. Electronically Signed   By: Charlett Nose M.D.   On: 07/28/2022 01:43   CT ABDOMEN PELVIS W CONTRAST  Result Date: 07/27/2022 CLINICAL DATA:  Abdominal pain, vomiting EXAM: CT ABDOMEN AND PELVIS WITH CONTRAST TECHNIQUE: Multidetector CT imaging of the abdomen and pelvis was performed using the standard protocol following bolus administration of intravenous contrast. RADIATION DOSE REDUCTION: This exam was performed according to the departmental dose-optimization program which includes automated exposure control, adjustment of the mA and/or kV according to patient size and/or use of iterative reconstruction technique. CONTRAST:  75mL OMNIPAQUE IOHEXOL 350 MG/ML SOLN COMPARISON:  05/07/2022 FINDINGS: Lower chest: Incidentally noted partially occlusive thrombus in the left lower lobe pulmonary artery segmental branch. Bibasilar airspace opacities with air bronchograms could reflect atelectasis or pneumonia. No effusions. Hepatobiliary: tips stent in place. Numerous scattered subcentimeter hypodensities in the liver are stable, likely cysts.  Gallbladder unremarkable. No biliary ductal dilatation. Pancreas: Coil embolization of varices in the pancreatic region. No focal abnormality or ductal dilatation. Spleen: No focal abnormality.  Normal size. Adrenals/Urinary Tract: No adrenal abnormality. No focal renal abnormality. No stones or hydronephrosis. Urinary bladder is unremarkable. Stomach/Bowel: Normal appendix. The colon is moderately dilated to the sigmoid colon where there is gradual tapering. No visible obstructing process or mass lesion. Air-fluid levels noted in the dilated colon.  Stomach and small bowel decompressed, unremarkable. Vascular/Lymphatic: No evidence of aneurysm or adenopathy. Reproductive: No visible focal abnormality. Other: No free fluid or free air. Musculoskeletal: No acute bony abnormality. IMPRESSION: Incidentally noted partially occlusive pulmonary embolus in the left lower lobe. Full extent/involvement could be evaluated with CTA chest. Bilateral lower lobe airspace opacities with air bronchograms could reflect atelectasis or pneumonia. Gaseous distention of the colon to the sigmoid colon with scattered air-fluid levels. No visible obstructing process. Findings may reflect adynamic ileus. Normal appendix. Prior tips and variceal coil embolization. Electronically Signed   By: Charlett Nose M.D.   On: 07/27/2022 21:11   DG Abdomen Acute W/Chest  Result Date: 07/27/2022 CLINICAL DATA:  Constipation EXAM: DG ABDOMEN ACUTE WITH 1 VIEW CHEST COMPARISON:  07/21/2022 FINDINGS: Diffusely gas-filled, distended colon, measuring up to 8.8 cm in caliber, with gas, stool, and fluid present to the rectum. No free air in the abdomen. TIPS projects over the right upper quadrant and coil material projects over the left hemiabdomen. Heart size and mediastinal contours are within normal limits. Both lungs are clear. IMPRESSION: Diffusely gas-filled, distended colon, measuring up to 8.8 cm in caliber, with gas, stool, and fluid present to the  rectum. Findings are most consistent with ileus. Electronically Signed   By: Jearld Lesch M.D.   On: 07/27/2022 16:40   IR Tips  Result Date: 07/22/2022 CLINICAL DATA:  42 year old male with a history of chronic portal vein thrombosis and gastrointestinal bleeding, presents for portal vein recanalization, possible tips and possible embolization EXAM: ULTRASOUND-GUIDED ACCESS RIGHT INTERNAL JUGULAR VEIN ULTRASOUND-GUIDED TRANS SPLENIC ACCESS INTO THE PORTAL VEIN ULTRASOUND-GUIDED TRANSHEPATIC ACCESS INTO THE PORTAL VENOUS SYSTEM BALLOON ANGIOPLASTY AND RECANALIZATION OF OCCLUDED PORTAL VEIN TRANSJUGULAR INTRAHEPATIC PORTOSYSTEMIC SHUNT COIL EMBOLIZATION OF MULTIPLE COMPETING VARICES MEDICATIONS: As antibiotic prophylaxis, Rocephin 1 gm IV was ordered pre-procedure and administered intravenously within one hour of incision. ANESTHESIA/SEDATION: General - as administered by the Anesthesia department Total intra-service moderate Sedation Time: 0 minutes. The patient's level of consciousness and vital signs were monitored continuously by radiology nursing throughout the procedure under my direct supervision. CONTRAST:  200 cc FLUOROSCOPY: Radiation Exposure Index (as provided by the fluoroscopic device): 4,096 mGy Kerma COMPLICATIONS: None PROCEDURE: Secondary to complexity of the case a second physician was required, with assistance from Dr Katherina Right, VIR. Informed written consent was obtained from the patient and the patient's family after a thorough discussion of the procedural risks, benefits and alternatives. Specific risks discussed with TIPS/variceal embolization included: Bleeding, infection, vascular injury, need for further procedure/surgery, renal injury/renal failure, contrast reaction, non-target embolization, liver dysfunction/failure, hepatic encephalopathy, stroke (~1%), cardiopulmonary collapse, death. All questions were addressed. Maximal Sterile Barrier Technique was utilized including caps, mask,  sterile gowns, sterile gloves, sterile drape, hand hygiene and skin antiseptic. A timeout was performed prior to the initiation of the procedure. Patient was positioned supine position on the table, with the right upper quadrant, left upper quadrant, right inguinal region, and the right neck prepped and draped in the usual sterile fashion. Transjugular systemic venous access Ultrasound survey was then performed of the right neck, with images stored and sent to PACs, confirming patency of the right IJ. Ultrasound guidance was then used to access the right internal jugular vein with a micro puncture kit. The wire was advanced under fluoroscopy into the right atrium, and a small incision was made. The needle was removed and the dilator was placed. The micro wire in the stiffener were removed and an 035 wire was  then passed into the inferior vena cava. Ten French soft tissue dilation was performed on the wire, and then 10 [place] TIPS sheath was placed. Trans splenic access Ultrasound survey of the left upper quadrant was performed, with images stored and sent to PACs. Using ultrasound guidance, an Accustick set was used to access a splenic vein in the hilum of the spleen. The needle tip was confirmed with contrast injection under fluoroscopy. The 018 Mandril wire was passed centrally within the splenic vein. An incision was made with 11 blade scalpel and the needle was removed from the wire. The Accustick set was then advanced through the splenic tissue into the splenic vein and the Mandril wire, stiffener, and the inner dilator were removed. Injection of contrast confirmed location in the splenic vein. A standard 035 working wire was then placed through the 4 [place] sheath and the 4 [place] Accustick sheath was removed. A 25 cm 5 French Brite tip sheath was then placed through the spleen into the splenic vein. Multiple angiogram with varying obliquities were performed. After review of the images, we elected to attempt  proceeding with antegrade recanalization of the occluded splenic vein/portal vein. A stiff Glidewire and an 035 Terumo Navicross catheter probe the occlusive region. After several attempts, we elected to withdraw from an antegrade attempt at this time and acquire transhepatic access. Trans hepatic access Ultrasound survey of the right upper quadrant was performed, with images stored and sent to PACs. Using ultrasound guidance, Chiba needle was used access the peripheral right portal system. Once we confirmed position in the portal system with portal venography, micro wire was advanced centrally. The 018 wire passed fairly easily through the right portal vein and main portal vein retrograde. The Accustick system advanced into the portal system, and then the the stiffener, wire, and dilator were removed. Venogram confirmed are location within the portal system. Stiff Glidewire was then advanced as far into the portal system as possible. This a then allowed replacement of the 4 [place] dilator with a 6 [place] standard bright tip sheath, 55 cm. Through the 6 [place] system we were able to cross the occluded portal vein to the site of inflow of developing caval transformation. We then used an angled 100 cm Davis catheter to find the occluded origin of the splenic vein, and the David catheter and glidewire were passed retrograde into the splenic vein. Angiogram confirmed location. We then passed an Amplatz wire through the transhepatic access into the splenic vein, and with some luck the Amplatz wire rendezvous with the 5 French sheath from the trans splenic access. Balloon angioplasty was then performed along the length of the occluded portal system with 6 mm balloon. Angiogram was performed. 8 mm x 80 mm balloon angioplasty was then performed along the length of the portal system. Angiogram was performed. We then elected to proceed with tips, with the strategy of an inflated balloon as the target in the portal system. 6  mm by 60 mm balloon was then inflated in the portal vein as a target. Transjugular intrahepatic portosystemic shunt Combination of Benson wire and multipurpose angled catheter were used to select the middle hepatic vein. Venogram confirmed location within the vein. Tip sheath was then advanced over the catheter with a coaxial Amplatz wire, for a position cephalad to the transition point of the wire in the portal venous system. Venogram of the hepatic venous system was performed both in a frontal projection and a right anterior oblique projection. The [place] needle was then  advanced through the TIPS sheath housed in the Teflon sheath over the Amplatz wire. Using frontal and oblique projections, first attempt of portal access was performed. This resulted in prolapse of the system into the IVC and the needle was removed for further access into the selected hepatic vein. Bentson wire and the multipurpose angled catheter were again used to select the middle hepatic vein. Once the catheter was distal the Amplatz wire was advanced and then the sheath was further advanced into the selected middle hepatic vein. The protective Teflon sheath and the colapinto needle were again advanced on the Amplatz wire which was then removed. From this position approximately 5 passes were necessary to access the portal venous system, by way of targeting the balloon. The balloon was in fact not punctured and the needle entered the portal vein adjacent to the balloon. Access into the portal venous system was confirmed by advancing a Glidewire distally into the splenic vein. Needle and the Teflon protective sheath were removed. A 100 cm 4 French glide cath was then advanced over the Glidewire into the portal system. The Glidewire was removed and Amplatz wire was then placed into the splenic vein to continue. A marking pigtail catheter was then advanced into the splenic vein. Angiogram was performed to confirm location. The position of the  marking pigtail with then used to estimate the length of our tips stent, and we selected 10 mm-80 mm for placement. Marking pigtail catheter was then removed on an Amplatz wire. Balloon angioplasty of 6 mm x 40 mm was then performed along the tissue tract. Sheath was advanced on the balloon as a deflated and once the sheath was into the portal vein and angiogram was performed to confirm the tips stent target. Before placing the tips stent final balloon angioplasty was performed in the splenic vein with a 10 mm by 80 mm balloon. The tip stent was then deployed and post dilated with the 10 mm balloon. Balloon angioplasty was performed along the length of the previously occluded portal vein. Balloon was removed and angiogram was performed. Pigtail catheter was advanced into the splenic vein for repeat angiogram. After review of the images we elected to proceed with coil embolization of the visualized varices in order to direct flow through the portal venous system. Embolization of varices The angled 035 catheter was placed into the splenic vein and a coaxial penumbra microcatheter was advanced. Combination of 014 fathom wire and the penumbra microcatheter were used to select the varices from the splenic vein. With safe position, coil embolization was performed with a series of penumbra Ruby coils. Repeat angiogram demonstrated some additional varices that we elected to coil embolize. The same microcatheter, microwire were used to select the varices. Coil embolization performed with additional series of penumbra Ruby coils. Repeat venogram demonstrated stasis of the embolized varices. Additional varices were filling, however, were less apparent on the entrance flow. Given that there was significant antegrade flow through the tips stent we elected not to perform further coil embolization. We did, however, elect to attempt recanalization of the superior mesenteric vein to improve flow into the portal vein.  Revascularization of inferior mesenteric vein Microcatheter was removed after the coil embolization. The angled 035 catheter was then used to probe for the superior mesenteric vein with the use of a standard Glidewire. While we could not identify the superior mesenteric vein inflow/occluded stump, the catheter did enter the inferior mesenteric vein which was occluded at the inflow to the portal system. Angiogram was  performed confirming retrograde flow and we elected to balloon angioplasty the origin. 035 wire was then advanced into the IMV and the catheter was removed. 6 mm balloon angioplasty was then performed at the IMV confluence. Balloon was removed catheter was advanced into the IMV and angiogram was performed demonstrating antegrade flow into the portal system. After review of images and the case we elected to withdraw at this time. The transhepatic portal vein access was withdrawn and embolized with a combination of MRey coils and Gel-Foam slurry. The trans splenic portal vein access was withdrawn and embolized with a series of tornado and MRey coils. The transjugular access was removed with manual pressure for hemostasis. Patient remained hemodynamically stable throughout. No complications were encountered.  No significant blood loss. Benson wire was then passed through the Colapinto needle into the inferior mesenteric vein. Needle was removed and disposed. Pigtail marking catheter was then advanced over the wire for portal venogram confirming location. 6 mm balloon angioplasty was then performed of the soft tissue tract. Sheath was advanced over the deflating balloon into the portal venous system. Multipurpose angled catheter was advanced into the distal splenic vein in the splenic hilum, for splenic venogram. IMPRESSION: Status post ultrasound-guided right IJ venous access, ultrasound-guided trans splenic portal vein access, ultrasound-guided transhepatic portal vein access for revascularization of  occluded portal vein and IMV, TIPS, and coil embolization of competing varices, as treatment for repeat episodes of GI bleeding related to portal hypertension. Signed, Yvone Neu. Miachel Roux, RPVI [facility] Electronically Signed   By: Gilmer Mor D.O.   On: 07/22/2022 08:59   IR US Guide Vasc Access Right  Result Date: 07/22/2022 CLINICAL DATA:  42 year old male with a history of chronic portal vein thrombosis and gastrointestinal bleeding, presents for portal vein recanalization, possible tips and possible embolization EXAM: ULTRASOUND-GUIDED ACCESS RIGHT INTERNAL JUGULAR VEIN ULTRASOUND-GUIDED TRANS SPLENIC ACCESS INTO THE PORTAL VEIN ULTRASOUND-GUIDED TRANSHEPATIC ACCESS INTO THE PORTAL VENOUS SYSTEM BALLOON ANGIOPLASTY AND RECANALIZATION OF OCCLUDED PORTAL VEIN TRANSJUGULAR INTRAHEPATIC PORTOSYSTEMIC SHUNT COIL EMBOLIZATION OF MULTIPLE COMPETING VARICES MEDICATIONS: As antibiotic prophylaxis, Rocephin 1 gm IV was ordered pre-procedure and administered intravenously within one hour of incision. ANESTHESIA/SEDATION: General - as administered by the Anesthesia department Total intra-service moderate Sedation Time: 0 minutes. The patient's level of consciousness and vital signs were monitored continuously by radiology nursing throughout the procedure under my direct supervision. CONTRAST:  200 cc FLUOROSCOPY: Radiation Exposure Index (as provided by the fluoroscopic device): 4,096 mGy Kerma COMPLICATIONS: None PROCEDURE: Secondary to complexity of the case a second physician was required, with assistance from Dr Katherina Right, VIR. Informed written consent was obtained from the patient and the patient's family after a thorough discussion of the procedural risks, benefits and alternatives. Specific risks discussed with TIPS/variceal embolization included: Bleeding, infection, vascular injury, need for further procedure/surgery, renal injury/renal  failure, contrast reaction, non-target embolization, liver dysfunction/failure, hepatic encephalopathy, stroke (~1%), cardiopulmonary collapse, death. All questions were addressed. Maximal Sterile Barrier Technique was utilized including caps, mask, sterile gowns, sterile gloves, sterile drape, hand hygiene and skin antiseptic. A timeout was performed prior to the initiation of the procedure. Patient was positioned supine position on the table, with the right upper quadrant, left upper quadrant, right inguinal region, and the right neck prepped and draped in the usual sterile fashion. Transjugular systemic venous access Ultrasound survey was then performed of the right neck, with images stored and sent to PACs, confirming patency of the right IJ. Ultrasound  guidance was then used to access the right internal jugular vein with a micro puncture kit. The wire was advanced under fluoroscopy into the right atrium, and a small incision was made. The needle was removed and the dilator was placed. The micro wire in the stiffener were removed and an 035 wire was then passed into the inferior vena cava. Ten French soft tissue dilation was performed on the wire, and then 10 [place] TIPS sheath was placed. Trans splenic access Ultrasound survey of the left upper quadrant was performed, with images stored and sent to PACs. Using ultrasound guidance, an Accustick set was used to access a splenic vein in the hilum of the spleen. The needle tip was confirmed with contrast injection under fluoroscopy. The 018 Mandril wire was passed centrally within the splenic vein. An incision was made with 11 blade scalpel and the needle was removed from the wire. The Accustick set was then advanced through the splenic tissue into the splenic vein and the Mandril wire, stiffener, and the inner dilator were removed. Injection of contrast confirmed location in the splenic vein. A standard 035 working wire was then placed through the 4 [place] sheath  and the 4 [place] Accustick sheath was removed. A 25 cm 5 French Brite tip sheath was then placed through the spleen into the splenic vein. Multiple angiogram with varying obliquities were performed. After review of the images, we elected to attempt proceeding with antegrade recanalization of the occluded splenic vein/portal vein. A stiff Glidewire and an 035 Terumo Navicross catheter probe the occlusive region. After several attempts, we elected to withdraw from an antegrade attempt at this time and acquire transhepatic access. Trans hepatic access Ultrasound survey of the right upper quadrant was performed, with images stored and sent to PACs. Using ultrasound guidance, Chiba needle was used access the peripheral right portal system. Once we confirmed position in the portal system with portal venography, micro wire was advanced centrally. The 018 wire passed fairly easily through the right portal vein and main portal vein retrograde. The Accustick system advanced into the portal system, and then the the stiffener, wire, and dilator were removed. Venogram confirmed are location within the portal system. Stiff Glidewire was then advanced as far into the portal system as possible. This a then allowed replacement of the 4 [place] dilator with a 6 [place] standard bright tip sheath, 55 cm. Through the 6 [place] system we were able to cross the occluded portal vein to the site of inflow of developing caval transformation. We then used an angled 100 cm Davis catheter to find the occluded origin of the splenic vein, and the David catheter and glidewire were passed retrograde into the splenic vein. Angiogram confirmed location. We then passed an Amplatz wire through the transhepatic access into the splenic vein, and with some luck the Amplatz wire rendezvous with the 5 French sheath from the trans splenic access. Balloon angioplasty was then performed along the length of the occluded portal system with 6 mm balloon. Angiogram  was performed. 8 mm x 80 mm balloon angioplasty was then performed along the length of the portal system. Angiogram was performed. We then elected to proceed with tips, with the strategy of an inflated balloon as the target in the portal system. 6 mm by 60 mm balloon was then inflated in the portal vein as a target. Transjugular intrahepatic portosystemic shunt Combination of Benson wire and multipurpose angled catheter were used to select the middle hepatic vein. Venogram confirmed location within the  vein. Tip sheath was then advanced over the catheter with a coaxial Amplatz wire, for a position cephalad to the transition point of the wire in the portal venous system. Venogram of the hepatic venous system was performed both in a frontal projection and a right anterior oblique projection. The Colapinto needle was then advanced through the TIPS sheath housed in the Teflon sheath over the Amplatz wire. Using frontal and oblique projections, first attempt of portal access was performed. This resulted in prolapse of the system into the IVC and the needle was removed for further access into the selected hepatic vein. Bentson wire and the multipurpose angled catheter were again used to select the middle hepatic vein. Once the catheter was distal the Amplatz wire was advanced and then the sheath was further advanced into the selected middle hepatic vein. The protective Teflon sheath and the colapinto needle were again advanced on the Amplatz wire which was then removed. From this position approximately 5 passes were necessary to access the portal venous system, by way of targeting the balloon. The balloon was in fact not punctured and the needle entered the portal vein adjacent to the balloon. Access into the portal venous system was confirmed by advancing a Glidewire distally into the splenic vein. Needle and the Teflon protective sheath were removed. A 100 cm 4 French glide cath was then advanced over the Glidewire into  the portal system. The Glidewire was removed and Amplatz wire was then placed into the splenic vein to continue. A marking pigtail catheter was then advanced into the splenic vein. Angiogram was performed to confirm location. The position of the marking pigtail with then used to estimate the length of our tips stent, and we selected 10 mm-80 mm for placement. Marking pigtail catheter was then removed on an Amplatz wire. Balloon angioplasty of 6 mm x 40 mm was then performed along the tissue tract. Sheath was advanced on the balloon as a deflated and once the sheath was into the portal vein and angiogram was performed to confirm the tips stent target. Before placing the tips stent final balloon angioplasty was performed in the splenic vein with a 10 mm by 80 mm balloon. The tip stent was then deployed and post dilated with the 10 mm balloon. Balloon angioplasty was performed along the length of the previously occluded portal vein. Balloon was removed and angiogram was performed. Pigtail catheter was advanced into the splenic vein for repeat angiogram. After review of the images we elected to proceed with coil embolization of the visualized varices in order to direct flow through the portal venous system. Embolization of varices The angled 035 catheter was placed into the splenic vein and a coaxial penumbra microcatheter was advanced. Combination of 014 fathom wire and the penumbra microcatheter were used to select the varices from the splenic vein. With safe position, coil embolization was performed with a series of penumbra Ruby coils. Repeat angiogram demonstrated some additional varices that we elected to coil embolize. The same microcatheter, microwire were used to select the varices. Coil embolization performed with additional series of penumbra Ruby coils. Repeat venogram demonstrated stasis of the embolized varices. Additional varices were filling, however, were less apparent on the entrance flow. Given that  there was significant antegrade flow through the tips stent we elected not to perform further coil embolization. We did, however, elect to attempt recanalization of the superior mesenteric vein to improve flow into the portal vein. Revascularization of inferior mesenteric vein Microcatheter was removed after the  coil embolization. The angled 035 catheter was then used to probe for the superior mesenteric vein with the use of a standard Glidewire. While we could not identify the superior mesenteric vein inflow/occluded stump, the catheter did enter the inferior mesenteric vein which was occluded at the inflow to the portal system. Angiogram was performed confirming retrograde flow and we elected to balloon angioplasty the origin. 035 wire was then advanced into the IMV and the catheter was removed. 6 mm balloon angioplasty was then performed at the IMV confluence. Balloon was removed catheter was advanced into the IMV and angiogram was performed demonstrating antegrade flow into the portal system. After review of images and the case we elected to withdraw at this time. The transhepatic portal vein access was withdrawn and embolized with a combination of MRey coils and Gel-Foam slurry. The trans splenic portal vein access was withdrawn and embolized with a series of tornado and MRey coils. The transjugular access was removed with manual pressure for hemostasis. Patient remained hemodynamically stable throughout. No complications were encountered.  No significant blood loss. Benson wire was then passed through the Colapinto needle into the inferior mesenteric vein. Needle was removed and disposed. Pigtail marking catheter was then advanced over the wire for portal venogram confirming location. 6 mm balloon angioplasty was then performed of the soft tissue tract. Sheath was advanced over the deflating balloon into the portal venous system. Multipurpose angled catheter was advanced into the distal splenic vein in the  splenic hilum, for splenic venogram. IMPRESSION: Status post ultrasound-guided right IJ venous access, ultrasound-guided trans splenic portal vein access, ultrasound-guided transhepatic portal vein access for revascularization of occluded portal vein and IMV, TIPS, and coil embolization of competing varices, as treatment for repeat episodes of GI bleeding related to portal hypertension. Signed, Yvone Neu. Miachel Roux, RPVI [facility] Radiology Electronically Signed   By: Gilmer Mor D.O.   On: 07/22/2022 08:59   IR US Guide Vasc Access Left  Result Date: 07/22/2022 CLINICAL DATA:  42 year old male with a history of chronic portal vein thrombosis and gastrointestinal bleeding, presents for portal vein recanalization, possible tips and possible embolization EXAM: ULTRASOUND-GUIDED ACCESS RIGHT INTERNAL JUGULAR VEIN ULTRASOUND-GUIDED TRANS SPLENIC ACCESS INTO THE PORTAL VEIN ULTRASOUND-GUIDED TRANSHEPATIC ACCESS INTO THE PORTAL VENOUS SYSTEM BALLOON ANGIOPLASTY AND RECANALIZATION OF OCCLUDED PORTAL VEIN TRANSJUGULAR INTRAHEPATIC PORTOSYSTEMIC SHUNT COIL EMBOLIZATION OF MULTIPLE COMPETING VARICES MEDICATIONS: As antibiotic prophylaxis, Rocephin 1 gm IV was ordered pre-procedure and administered intravenously within one hour of incision. ANESTHESIA/SEDATION: General - as administered by the Anesthesia department Total intra-service moderate Sedation Time: 0 minutes. The patient's level of consciousness and vital signs were monitored continuously by radiology nursing throughout the procedure under my direct supervision. CONTRAST:  200 cc FLUOROSCOPY: Radiation Exposure Index (as provided by the fluoroscopic device): 4,096 mGy Kerma COMPLICATIONS: None PROCEDURE: Secondary to complexity of the case a second physician was required, with assistance from Dr Katherina Right, VIR. Informed written consent was obtained from the patient and the patient's family after a thorough  discussion of the procedural risks, benefits and alternatives. Specific risks discussed with TIPS/variceal embolization included: Bleeding, infection, vascular injury, need for further procedure/surgery, renal injury/renal failure, contrast reaction, non-target embolization, liver dysfunction/failure, hepatic encephalopathy, stroke (~1%), cardiopulmonary collapse, death. All questions were addressed. Maximal Sterile Barrier Technique was utilized including caps, mask, sterile gowns, sterile gloves, sterile drape, hand hygiene and skin antiseptic. A timeout was performed prior to the initiation of the procedure. Patient was positioned supine  position on the table, with the right upper quadrant, left upper quadrant, right inguinal region, and the right neck prepped and draped in the usual sterile fashion. Transjugular systemic venous access Ultrasound survey was then performed of the right neck, with images stored and sent to PACs, confirming patency of the right IJ. Ultrasound guidance was then used to access the right internal jugular vein with a micro puncture kit. The wire was advanced under fluoroscopy into the right atrium, and a small incision was made. The needle was removed and the dilator was placed. The micro wire in the stiffener were removed and an 035 wire was then passed into the inferior vena cava. Ten French soft tissue dilation was performed on the wire, and then 10 [place] TIPS sheath was placed. Trans splenic access Ultrasound survey of the left upper quadrant was performed, with images stored and sent to PACs. Using ultrasound guidance, an Accustick set was used to access a splenic vein in the hilum of the spleen. The needle tip was confirmed with contrast injection under fluoroscopy. The 018 Mandril wire was passed centrally within the splenic vein. An incision was made with 11 blade scalpel and the needle was removed from the wire. The Accustick set was then advanced through the splenic tissue  into the splenic vein and the Mandril wire, stiffener, and the inner dilator were removed. Injection of contrast confirmed location in the splenic vein. A standard 035 working wire was then placed through the 4 [place] sheath and the 4 [place] Accustick sheath was removed. A 25 cm 5 French Brite tip sheath was then placed through the spleen into the splenic vein. Multiple angiogram with varying obliquities were performed. After review of the images, we elected to attempt proceeding with antegrade recanalization of the occluded splenic vein/portal vein. A stiff Glidewire and an 035 Terumo Navicross catheter probe the occlusive region. After several attempts, we elected to withdraw from an antegrade attempt at this time and acquire transhepatic access. Trans hepatic access Ultrasound survey of the right upper quadrant was performed, with images stored and sent to PACs. Using ultrasound guidance, Chiba needle was used access the peripheral right portal system. Once we confirmed position in the portal system with portal venography, micro wire was advanced centrally. The 018 wire passed fairly easily through the right portal vein and main portal vein retrograde. The Accustick system advanced into the portal system, and then the the stiffener, wire, and dilator were removed. Venogram confirmed are location within the portal system. Stiff Glidewire was then advanced as far into the portal system as possible. This a then allowed replacement of the 4 [place] dilator with a 6 [place] standard bright tip sheath, 55 cm. Through the 6 [place] system we were able to cross the occluded portal vein to the site of inflow of developing caval transformation. We then used an angled 100 cm Davis catheter to find the occluded origin of the splenic vein, and the David catheter and glidewire were passed retrograde into the splenic vein. Angiogram confirmed location. We then passed an Amplatz wire through the transhepatic access into the splenic  vein, and with some luck the Amplatz wire rendezvous with the 5 French sheath from the trans splenic access. Balloon angioplasty was then performed along the length of the occluded portal system with 6 mm balloon. Angiogram was performed. 8 mm x 80 mm balloon angioplasty was then performed along the length of the portal system. Angiogram was performed. We then elected to proceed with tips, with  the strategy of an inflated balloon as the target in the portal system. 6 mm by 60 mm balloon was then inflated in the portal vein as a target. Transjugular intrahepatic portosystemic shunt Combination of Benson wire and multipurpose angled catheter were used to select the middle hepatic vein. Venogram confirmed location within the vein. Tip sheath was then advanced over the catheter with a coaxial Amplatz wire, for a position cephalad to the transition point of the wire in the portal venous system. Venogram of the hepatic venous system was performed both in a frontal projection and a right anterior oblique projection. The Colapinto needle was then advanced through the TIPS sheath housed in the Teflon sheath over the Amplatz wire. Using frontal and oblique projections, first attempt of portal access was performed. This resulted in prolapse of the system into the IVC and the needle was removed for further access into the selected hepatic vein. Bentson wire and the multipurpose angled catheter were again used to select the middle hepatic vein. Once the catheter was distal the Amplatz wire was advanced and then the sheath was further advanced into the selected middle hepatic vein. The protective Teflon sheath and the colapinto needle were again advanced on the Amplatz wire which was then removed. From this position approximately 5 passes were necessary to access the portal venous system, by way of targeting the balloon. The balloon was in fact not punctured and the needle entered the portal vein adjacent to the balloon. Access  into the portal venous system was confirmed by advancing a Glidewire distally into the splenic vein. Needle and the Teflon protective sheath were removed. A 100 cm 4 French glide cath was then advanced over the Glidewire into the portal system. The Glidewire was removed and Amplatz wire was then placed into the splenic vein to continue. A marking pigtail catheter was then advanced into the splenic vein. Angiogram was performed to confirm location. The position of the marking pigtail with then used to estimate the length of our tips stent, and we selected 10 mm-80 mm for placement. Marking pigtail catheter was then removed on an Amplatz wire. Balloon angioplasty of 6 mm x 40 mm was then performed along the tissue tract. Sheath was advanced on the balloon as a deflated and once the sheath was into the portal vein and angiogram was performed to confirm the tips stent target. Before placing the tips stent final balloon angioplasty was performed in the splenic vein with a 10 mm by 80 mm balloon. The tip stent was then deployed and post dilated with the 10 mm balloon. Balloon angioplasty was performed along the length of the previously occluded portal vein. Balloon was removed and angiogram was performed. Pigtail catheter was advanced into the splenic vein for repeat angiogram. After review of the images we elected to proceed with coil embolization of the visualized varices in order to direct flow through the portal venous system. Embolization of varices The angled 035 catheter was placed into the splenic vein and a coaxial penumbra microcatheter was advanced. Combination of 014 fathom wire and the penumbra microcatheter were used to select the varices from the splenic vein. With safe position, coil embolization was performed with a series of penumbra Ruby coils. Repeat angiogram demonstrated some additional varices that we elected to coil embolize. The same microcatheter, microwire were used to select the varices. Coil  embolization performed with additional series of penumbra Ruby coils. Repeat venogram demonstrated stasis of the embolized varices. Additional varices were filling, however, were  less apparent on the entrance flow. Given that there was significant antegrade flow through the tips stent we elected not to perform further coil embolization. We did, however, elect to attempt recanalization of the superior mesenteric vein to improve flow into the portal vein. Revascularization of inferior mesenteric vein Microcatheter was removed after the coil embolization. The angled 035 catheter was then used to probe for the superior mesenteric vein with the use of a standard Glidewire. While we could not identify the superior mesenteric vein inflow/occluded stump, the catheter did enter the inferior mesenteric vein which was occluded at the inflow to the portal system. Angiogram was performed confirming retrograde flow and we elected to balloon angioplasty the origin. 035 wire was then advanced into the IMV and the catheter was removed. 6 mm balloon angioplasty was then performed at the IMV confluence. Balloon was removed catheter was advanced into the IMV and angiogram was performed demonstrating antegrade flow into the portal system. After review of images and the case we elected to withdraw at this time. The transhepatic portal vein access was withdrawn and embolized with a combination of MRey coils and Gel-Foam slurry. The trans splenic portal vein access was withdrawn and embolized with a series of tornado and MRey coils. The transjugular access was removed with manual pressure for hemostasis. Patient remained hemodynamically stable throughout. No complications were encountered.  No significant blood loss. Benson wire was then passed through the Colapinto needle into the inferior mesenteric vein. Needle was removed and disposed. Pigtail marking catheter was then advanced over the wire for portal venogram confirming location. 6  mm balloon angioplasty was then performed of the soft tissue tract. Sheath was advanced over the deflating balloon into the portal venous system. Multipurpose angled catheter was advanced into the distal splenic vein in the splenic hilum, for splenic venogram. IMPRESSION: Status post ultrasound-guided right IJ venous access, ultrasound-guided trans splenic portal vein access, ultrasound-guided transhepatic portal vein access for revascularization of occluded portal vein and IMV, TIPS, and coil embolization of competing varices, as treatment for repeat episodes of GI bleeding related to portal hypertension. Signed, Yvone Neu. Miachel Roux, RPVI [facility] Radiology Electronically Signed   By: Gilmer Mor D.O.   On: 07/22/2022 08:59   IR EMBO ART  VEN HEMORR LYMPH EXTRAV  INC GUIDE ROADMAPPING  Result Date: 07/22/2022 CLINICAL DATA:  42 year old male with a history of chronic portal vein thrombosis and gastrointestinal bleeding, presents for portal vein recanalization, possible tips and possible embolization EXAM: ULTRASOUND-GUIDED ACCESS RIGHT INTERNAL JUGULAR VEIN ULTRASOUND-GUIDED TRANS SPLENIC ACCESS INTO THE PORTAL VEIN ULTRASOUND-GUIDED TRANSHEPATIC ACCESS INTO THE PORTAL VENOUS SYSTEM BALLOON ANGIOPLASTY AND RECANALIZATION OF OCCLUDED PORTAL VEIN TRANSJUGULAR INTRAHEPATIC PORTOSYSTEMIC SHUNT COIL EMBOLIZATION OF MULTIPLE COMPETING VARICES MEDICATIONS: As antibiotic prophylaxis, Rocephin 1 gm IV was ordered pre-procedure and administered intravenously within one hour of incision. ANESTHESIA/SEDATION: General - as administered by the Anesthesia department Total intra-service moderate Sedation Time: 0 minutes. The patient's level of consciousness and vital signs were monitored continuously by radiology nursing throughout the procedure under my direct supervision. CONTRAST:  200 cc FLUOROSCOPY: Radiation Exposure Index (as provided by the fluoroscopic  device): 4,096 mGy Kerma COMPLICATIONS: None PROCEDURE: Secondary to complexity of the case a second physician was required, with assistance from Dr Katherina Right, VIR. Informed written consent was obtained from the patient and the patient's family after a thorough discussion of the procedural risks, benefits and alternatives. Specific risks discussed with TIPS/variceal embolization included: Bleeding, infection, vascular  injury, need for further procedure/surgery, renal injury/renal failure, contrast reaction, non-target embolization, liver dysfunction/failure, hepatic encephalopathy, stroke (~1%), cardiopulmonary collapse, death. All questions were addressed. Maximal Sterile Barrier Technique was utilized including caps, mask, sterile gowns, sterile gloves, sterile drape, hand hygiene and skin antiseptic. A timeout was performed prior to the initiation of the procedure. Patient was positioned supine position on the table, with the right upper quadrant, left upper quadrant, right inguinal region, and the right neck prepped and draped in the usual sterile fashion. Transjugular systemic venous access Ultrasound survey was then performed of the right neck, with images stored and sent to PACs, confirming patency of the right IJ. Ultrasound guidance was then used to access the right internal jugular vein with a micro puncture kit. The wire was advanced under fluoroscopy into the right atrium, and a small incision was made. The needle was removed and the dilator was placed. The micro wire in the stiffener were removed and an 035 wire was then passed into the inferior vena cava. Ten French soft tissue dilation was performed on the wire, and then 10 [place] TIPS sheath was placed. Trans splenic access Ultrasound survey of the left upper quadrant was performed, with images stored and sent to PACs. Using ultrasound guidance, an Accustick set was used to access a splenic vein in the hilum of the spleen. The needle tip was  confirmed with contrast injection under fluoroscopy. The 018 Mandril wire was passed centrally within the splenic vein. An incision was made with 11 blade scalpel and the needle was removed from the wire. The Accustick set was then advanced through the splenic tissue into the splenic vein and the Mandril wire, stiffener, and the inner dilator were removed. Injection of contrast confirmed location in the splenic vein. A standard 035 working wire was then placed through the 4 [place] sheath and the 4 [place] Accustick sheath was removed. A 25 cm 5 French Brite tip sheath was then placed through the spleen into the splenic vein. Multiple angiogram with varying obliquities were performed. After review of the images, we elected to attempt proceeding with antegrade recanalization of the occluded splenic vein/portal vein. A stiff Glidewire and an 035 Terumo Navicross catheter probe the occlusive region. After several attempts, we elected to withdraw from an antegrade attempt at this time and acquire transhepatic access. Trans hepatic access Ultrasound survey of the right upper quadrant was performed, with images stored and sent to PACs. Using ultrasound guidance, Chiba needle was used access the peripheral right portal system. Once we confirmed position in the portal system with portal venography, micro wire was advanced centrally. The 018 wire passed fairly easily through the right portal vein and main portal vein retrograde. The Accustick system advanced into the portal system, and then the the stiffener, wire, and dilator were removed. Venogram confirmed are location within the portal system. Stiff Glidewire was then advanced as far into the portal system as possible. This a then allowed replacement of the 4 [place] dilator with a 6 [place] standard bright tip sheath, 55 cm. Through the 6 [place] system we were able to cross the occluded portal vein to the site of inflow of developing caval transformation. We then used an  angled 100 cm Davis catheter to find the occluded origin of the splenic vein, and the David catheter and glidewire were passed retrograde into the splenic vein. Angiogram confirmed location. We then passed an Amplatz wire through the transhepatic access into the splenic vein, and with some luck the Amplatz  wire rendezvous with the 5 French sheath from the trans splenic access. Balloon angioplasty was then performed along the length of the occluded portal system with 6 mm balloon. Angiogram was performed. 8 mm x 80 mm balloon angioplasty was then performed along the length of the portal system. Angiogram was performed. We then elected to proceed with tips, with the strategy of an inflated balloon as the target in the portal system. 6 mm by 60 mm balloon was then inflated in the portal vein as a target. Transjugular intrahepatic portosystemic shunt Combination of Benson wire and multipurpose angled catheter were used to select the middle hepatic vein. Venogram confirmed location within the vein. Tip sheath was then advanced over the catheter with a coaxial Amplatz wire, for a position cephalad to the transition point of the wire in the portal venous system. Venogram of the hepatic venous system was performed both in a frontal projection and a right anterior oblique projection. The Colapinto needle was then advanced through the TIPS sheath housed in the Teflon sheath over the Amplatz wire. Using frontal and oblique projections, first attempt of portal access was performed. This resulted in prolapse of the system into the IVC and the needle was removed for further access into the selected hepatic vein. Bentson wire and the multipurpose angled catheter were again used to select the middle hepatic vein. Once the catheter was distal the Amplatz wire was advanced and then the sheath was further advanced into the selected middle hepatic vein. The protective Teflon sheath and the colapinto needle were again advanced on the  Amplatz wire which was then removed. From this position approximately 5 passes were necessary to access the portal venous system, by way of targeting the balloon. The balloon was in fact not punctured and the needle entered the portal vein adjacent to the balloon. Access into the portal venous system was confirmed by advancing a Glidewire distally into the splenic vein. Needle and the Teflon protective sheath were removed. A 100 cm 4 French glide cath was then advanced over the Glidewire into the portal system. The Glidewire was removed and Amplatz wire was then placed into the splenic vein to continue. A marking pigtail catheter was then advanced into the splenic vein. Angiogram was performed to confirm location. The position of the marking pigtail with then used to estimate the length of our tips stent, and we selected 10 mm-80 mm for placement. Marking pigtail catheter was then removed on an Amplatz wire. Balloon angioplasty of 6 mm x 40 mm was then performed along the tissue tract. Sheath was advanced on the balloon as a deflated and once the sheath was into the portal vein and angiogram was performed to confirm the tips stent target. Before placing the tips stent final balloon angioplasty was performed in the splenic vein with a 10 mm by 80 mm balloon. The tip stent was then deployed and post dilated with the 10 mm balloon. Balloon angioplasty was performed along the length of the previously occluded portal vein. Balloon was removed and angiogram was performed. Pigtail catheter was advanced into the splenic vein for repeat angiogram. After review of the images we elected to proceed with coil embolization of the visualized varices in order to direct flow through the portal venous system. Embolization of varices The angled 035 catheter was placed into the splenic vein and a coaxial penumbra microcatheter was advanced. Combination of 014 fathom wire and the penumbra microcatheter were used to select the varices  from the splenic vein.  With safe position, coil embolization was performed with a series of penumbra Ruby coils. Repeat angiogram demonstrated some additional varices that we elected to coil embolize. The same microcatheter, microwire were used to select the varices. Coil embolization performed with additional series of penumbra Ruby coils. Repeat venogram demonstrated stasis of the embolized varices. Additional varices were filling, however, were less apparent on the entrance flow. Given that there was significant antegrade flow through the tips stent we elected not to perform further coil embolization. We did, however, elect to attempt recanalization of the superior mesenteric vein to improve flow into the portal vein. Revascularization of inferior mesenteric vein Microcatheter was removed after the coil embolization. The angled 035 catheter was then used to probe for the superior mesenteric vein with the use of a standard Glidewire. While we could not identify the superior mesenteric vein inflow/occluded stump, the catheter did enter the inferior mesenteric vein which was occluded at the inflow to the portal system. Angiogram was performed confirming retrograde flow and we elected to balloon angioplasty the origin. 035 wire was then advanced into the IMV and the catheter was removed. 6 mm balloon angioplasty was then performed at the IMV confluence. Balloon was removed catheter was advanced into the IMV and angiogram was performed demonstrating antegrade flow into the portal system. After review of images and the case we elected to withdraw at this time. The transhepatic portal vein access was withdrawn and embolized with a combination of MRey coils and Gel-Foam slurry. The trans splenic portal vein access was withdrawn and embolized with a series of tornado and MRey coils. The transjugular access was removed with manual pressure for hemostasis. Patient remained hemodynamically stable throughout. No complications  were encountered.  No significant blood loss. Benson wire was then passed through the Colapinto needle into the inferior mesenteric vein. Needle was removed and disposed. Pigtail marking catheter was then advanced over the wire for portal venogram confirming location. 6 mm balloon angioplasty was then performed of the soft tissue tract. Sheath was advanced over the deflating balloon into the portal venous system. Multipurpose angled catheter was advanced into the distal splenic vein in the splenic hilum, for splenic venogram. IMPRESSION: Status post ultrasound-guided right IJ venous access, ultrasound-guided trans splenic portal vein access, ultrasound-guided transhepatic portal vein access for revascularization of occluded portal vein and IMV, TIPS, and coil embolization of competing varices, as treatment for repeat episodes of GI bleeding related to portal hypertension. Signed, Yvone Neu. Miachel Roux, RPVI [facility] Radiology Electronically Signed   By: Gilmer Mor D.O.   On: 07/22/2022 08:59   IR US Guide Vasc Access Right  Result Date: 07/22/2022 CLINICAL DATA:  42 year old male with a history of chronic portal vein thrombosis and gastrointestinal bleeding, presents for portal vein recanalization, possible tips and possible embolization EXAM: ULTRASOUND-GUIDED ACCESS RIGHT INTERNAL JUGULAR VEIN ULTRASOUND-GUIDED TRANS SPLENIC ACCESS INTO THE PORTAL VEIN ULTRASOUND-GUIDED TRANSHEPATIC ACCESS INTO THE PORTAL VENOUS SYSTEM BALLOON ANGIOPLASTY AND RECANALIZATION OF OCCLUDED PORTAL VEIN TRANSJUGULAR INTRAHEPATIC PORTOSYSTEMIC SHUNT COIL EMBOLIZATION OF MULTIPLE COMPETING VARICES MEDICATIONS: As antibiotic prophylaxis, Rocephin 1 gm IV was ordered pre-procedure and administered intravenously within one hour of incision. ANESTHESIA/SEDATION: General - as administered by the Anesthesia department Total intra-service moderate Sedation Time: 0 minutes. The  patient's level of consciousness and vital signs were monitored continuously by radiology nursing throughout the procedure under my direct supervision. CONTRAST:  200 cc FLUOROSCOPY: Radiation Exposure Index (as provided by the fluoroscopic device): 4,096 mGy Kerma  COMPLICATIONS: None PROCEDURE: Secondary to complexity of the case a second physician was required, with assistance from Dr Katherina Right, VIR. Informed written consent was obtained from the patient and the patient's family after a thorough discussion of the procedural risks, benefits and alternatives. Specific risks discussed with TIPS/variceal embolization included: Bleeding, infection, vascular injury, need for further procedure/surgery, renal injury/renal failure, contrast reaction, non-target embolization, liver dysfunction/failure, hepatic encephalopathy, stroke (~1%), cardiopulmonary collapse, death. All questions were addressed. Maximal Sterile Barrier Technique was utilized including caps, mask, sterile gowns, sterile gloves, sterile drape, hand hygiene and skin antiseptic. A timeout was performed prior to the initiation of the procedure. Patient was positioned supine position on the table, with the right upper quadrant, left upper quadrant, right inguinal region, and the right neck prepped and draped in the usual sterile fashion. Transjugular systemic venous access Ultrasound survey was then performed of the right neck, with images stored and sent to PACs, confirming patency of the right IJ. Ultrasound guidance was then used to access the right internal jugular vein with a micro puncture kit. The wire was advanced under fluoroscopy into the right atrium, and a small incision was made. The needle was removed and the dilator was placed. The micro wire in the stiffener were removed and an 035 wire was then passed into the inferior vena cava. Ten French soft tissue dilation was performed on the wire, and then 10 [place] TIPS sheath was placed. Trans  splenic access Ultrasound survey of the left upper quadrant was performed, with images stored and sent to PACs. Using ultrasound guidance, an Accustick set was used to access a splenic vein in the hilum of the spleen. The needle tip was confirmed with contrast injection under fluoroscopy. The 018 Mandril wire was passed centrally within the splenic vein. An incision was made with 11 blade scalpel and the needle was removed from the wire. The Accustick set was then advanced through the splenic tissue into the splenic vein and the Mandril wire, stiffener, and the inner dilator were removed. Injection of contrast confirmed location in the splenic vein. A standard 035 working wire was then placed through the 4 [place] sheath and the 4 [place] Accustick sheath was removed. A 25 cm 5 French Brite tip sheath was then placed through the spleen into the splenic vein. Multiple angiogram with varying obliquities were performed. After review of the images, we elected to attempt proceeding with antegrade recanalization of the occluded splenic vein/portal vein. A stiff Glidewire and an 035 Terumo Navicross catheter probe the occlusive region. After several attempts, we elected to withdraw from an antegrade attempt at this time and acquire transhepatic access. Trans hepatic access Ultrasound survey of the right upper quadrant was performed, with images stored and sent to PACs. Using ultrasound guidance, Chiba needle was used access the peripheral right portal system. Once we confirmed position in the portal system with portal venography, micro wire was advanced centrally. The 018 wire passed fairly easily through the right portal vein and main portal vein retrograde. The Accustick system advanced into the portal system, and then the the stiffener, wire, and dilator were removed. Venogram confirmed are location within the portal system. Stiff Glidewire was then advanced as far into the portal system as possible. This a then allowed  replacement of the 4 [place] dilator with a 6 [place] standard bright tip sheath, 55 cm. Through the 6 [place] system we were able to cross the occluded portal vein to the site of inflow of developing caval transformation.  We then used an angled 100 cm Davis catheter to find the occluded origin of the splenic vein, and the David catheter and glidewire were passed retrograde into the splenic vein. Angiogram confirmed location. We then passed an Amplatz wire through the transhepatic access into the splenic vein, and with some luck the Amplatz wire rendezvous with the 5 French sheath from the trans splenic access. Balloon angioplasty was then performed along the length of the occluded portal system with 6 mm balloon. Angiogram was performed. 8 mm x 80 mm balloon angioplasty was then performed along the length of the portal system. Angiogram was performed. We then elected to proceed with tips, with the strategy of an inflated balloon as the target in the portal system. 6 mm by 60 mm balloon was then inflated in the portal vein as a target. Transjugular intrahepatic portosystemic shunt Combination of Benson wire and multipurpose angled catheter were used to select the middle hepatic vein. Venogram confirmed location within the vein. Tip sheath was then advanced over the catheter with a coaxial Amplatz wire, for a position cephalad to the transition point of the wire in the portal venous system. Venogram of the hepatic venous system was performed both in a frontal projection and a right anterior oblique projection. The Colapinto needle was then advanced through the TIPS sheath housed in the Teflon sheath over the Amplatz wire. Using frontal and oblique projections, first attempt of portal access was performed. This resulted in prolapse of the system into the IVC and the needle was removed for further access into the selected hepatic vein. Bentson wire and the multipurpose angled catheter were again used to select the middle  hepatic vein. Once the catheter was distal the Amplatz wire was advanced and then the sheath was further advanced into the selected middle hepatic vein. The protective Teflon sheath and the colapinto needle were again advanced on the Amplatz wire which was then removed. From this position approximately 5 passes were necessary to access the portal venous system, by way of targeting the balloon. The balloon was in fact not punctured and the needle entered the portal vein adjacent to the balloon. Access into the portal venous system was confirmed by advancing a Glidewire distally into the splenic vein. Needle and the Teflon protective sheath were removed. A 100 cm 4 French glide cath was then advanced over the Glidewire into the portal system. The Glidewire was removed and Amplatz wire was then placed into the splenic vein to continue. A marking pigtail catheter was then advanced into the splenic vein. Angiogram was performed to confirm location. The position of the marking pigtail with then used to estimate the length of our tips stent, and we selected 10 mm-80 mm for placement. Marking pigtail catheter was then removed on an Amplatz wire. Balloon angioplasty of 6 mm x 40 mm was then performed along the tissue tract. Sheath was advanced on the balloon as a deflated and once the sheath was into the portal vein and angiogram was performed to confirm the tips stent target. Before placing the tips stent final balloon angioplasty was performed in the splenic vein with a 10 mm by 80 mm balloon. The tip stent was then deployed and post dilated with the 10 mm balloon. Balloon angioplasty was performed along the length of the previously occluded portal vein. Balloon was removed and angiogram was performed. Pigtail catheter was advanced into the splenic vein for repeat angiogram. After review of the images we elected to proceed with coil embolization  of the visualized varices in order to direct flow through the portal venous  system. Embolization of varices The angled 035 catheter was placed into the splenic vein and a coaxial penumbra microcatheter was advanced. Combination of 014 fathom wire and the penumbra microcatheter were used to select the varices from the splenic vein. With safe position, coil embolization was performed with a series of penumbra Ruby coils. Repeat angiogram demonstrated some additional varices that we elected to coil embolize. The same microcatheter, microwire were used to select the varices. Coil embolization performed with additional series of penumbra Ruby coils. Repeat venogram demonstrated stasis of the embolized varices. Additional varices were filling, however, were less apparent on the entrance flow. Given that there was significant antegrade flow through the tips stent we elected not to perform further coil embolization. We did, however, elect to attempt recanalization of the superior mesenteric vein to improve flow into the portal vein. Revascularization of inferior mesenteric vein Microcatheter was removed after the coil embolization. The angled 035 catheter was then used to probe for the superior mesenteric vein with the use of a standard Glidewire. While we could not identify the superior mesenteric vein inflow/occluded stump, the catheter did enter the inferior mesenteric vein which was occluded at the inflow to the portal system. Angiogram was performed confirming retrograde flow and we elected to balloon angioplasty the origin. 035 wire was then advanced into the IMV and the catheter was removed. 6 mm balloon angioplasty was then performed at the IMV confluence. Balloon was removed catheter was advanced into the IMV and angiogram was performed demonstrating antegrade flow into the portal system. After review of images and the case we elected to withdraw at this time. The transhepatic portal vein access was withdrawn and embolized with a combination of MRey coils and Gel-Foam slurry. The trans  splenic portal vein access was withdrawn and embolized with a series of tornado and MRey coils. The transjugular access was removed with manual pressure for hemostasis. Patient remained hemodynamically stable throughout. No complications were encountered.  No significant blood loss. Benson wire was then passed through the Colapinto needle into the inferior mesenteric vein. Needle was removed and disposed. Pigtail marking catheter was then advanced over the wire for portal venogram confirming location. 6 mm balloon angioplasty was then performed of the soft tissue tract. Sheath was advanced over the deflating balloon into the portal venous system. Multipurpose angled catheter was advanced into the distal splenic vein in the splenic hilum, for splenic venogram. IMPRESSION: Status post ultrasound-guided right IJ venous access, ultrasound-guided trans splenic portal vein access, ultrasound-guided transhepatic portal vein access for revascularization of occluded portal vein and IMV, TIPS, and coil embolization of competing varices, as treatment for repeat episodes of GI bleeding related to portal hypertension. Signed, Yvone Neu. Miachel Roux, RPVI [facility] Electronically Signed   By: Gilmer Mor D.O.   On: 07/22/2022 08:59   DG Chest Port 1 View  Result Date: 07/21/2022 CLINICAL DATA:  Shortness of breath EXAM: PORTABLE CHEST 1 VIEW COMPARISON:  07/12/2022 FINDINGS: Low lung volumes with subsegmental atelectasis at the bases. No consolidation or pleural effusion. Stable cardiomediastinal silhouette. No pneumothorax. Numerous embolization coils in the left upper quadrant which are new compared to the prior exam. IMPRESSION: Low lung volumes with subsegmental atelectasis at the bases. Electronically Signed   By: Jasmine Pang M.D.   On: 07/21/2022 17:49   CT Angio Chest Pulmonary Embolism (PE) W or WO Contrast  Result  Date: 07/12/2022 CLINICAL DATA:   Dyspnea, hypoxia. EXAM: CT ANGIOGRAPHY CHEST WITH CONTRAST TECHNIQUE: Multidetector CT imaging of the chest was performed using the standard protocol during bolus administration of intravenous contrast. Multiplanar CT image reconstructions and MIPs were obtained to evaluate the vascular anatomy. RADIATION DOSE REDUCTION: This exam was performed according to the departmental dose-optimization program which includes automated exposure control, adjustment of the mA and/or kV according to patient size and/or use of iterative reconstruction technique. CONTRAST:  75mL OMNIPAQUE IOHEXOL 350 MG/ML SOLN COMPARISON:  CTA GI bleed 07/12/2022.  Chest CT 02/20/2021. FINDINGS: Cardiovascular: Examination is limited secondary to timing of the contrast bolus and respiratory motion artifact. There several focal hypodense areas seen within segmental and subsegmental levels of the right upper lobe and right lower lobe which are indeterminate for small pulmonary emboli for example axial image 5/66. The main pulmonary artery is normal in size. Heart and aorta are normal in size. There is no pericardial effusion. Mediastinum/Nodes: There are prominent, but nonenlarged left hilar lymph nodes. Visualized esophagus and thyroid gland are within normal limits. Lungs/Pleura: There is new dense airspace consolidation throughout the entire left lower lobe and minimally in the inferior left upper lobe. There are additional patchy ground-glass opacities throughout the left upper lobe. There is a opacification of left lower lobe bronchi. The right lung is clear. There is no pleural effusion or pneumothorax. Upper Abdomen: Small amount of ascites in the upper abdomen appears unchanged. Musculoskeletal: No chest wall abnormality. No acute or significant osseous findings. Review of the MIP images confirms the above findings. IMPRESSION: 1. Examination is limited secondary to timing of the contrast bolus and respiratory motion artifact. There are  several focal hypodense areas seen within segmental and subsegmental levels of the right upper lobe and right lower lobe which are indeterminate for small pulmonary emboli. No evidence for right heart strain. 2. New dense airspace consolidation throughout the entire left lower lobe and minimally in the inferior left upper lobe. There is opacification of left lower lobe bronchi. Findings are concerning for pneumonia. Follow-up imaging recommended to confirm resolution. 3. Stable small amount of ascites in the upper abdomen. Electronically Signed   By: Darliss Cheney M.D.   On: 07/12/2022 18:02   DG CHEST PORT 1 VIEW  Result Date: 07/12/2022 CLINICAL DATA:  Dyspnea. EXAM: PORTABLE CHEST 1 VIEW COMPARISON:  AP chest 02/20/2021 and CT chest 02/20/2021 FINDINGS: Cardiac silhouette and mediastinal contours within normal limits. There is a new heterogeneous airspace opacification of the left mid and lower lung. The right lung appears clear. No definite pleural effusion. No pneumothorax. No acute skeletal abnormality. IMPRESSION: New heterogeneous airspace opacification of the left mid and lower lung, concerning for pneumonia. Note is made that on the CT abdomen and pelvis and frontal scout view of the CT approximately 7 hours earlier today at the left lower lung appears more clear. Question pneumonia versus recent aspiration. Electronically Signed   By: Neita Garnet M.D.   On: 07/12/2022 17:01   CT ANGIO GI BLEED  Result Date: 07/12/2022 CLINICAL DATA:  Evaluate for GI bleed.  Anemia. EXAM: CTA ABDOMEN AND PELVIS WITHOUT AND WITH CONTRAST TECHNIQUE: Multidetector CT imaging of the abdomen and pelvis was performed using the standard protocol during bolus administration of intravenous contrast. Multiplanar reconstructed images and MIPs were obtained and reviewed to evaluate the vascular anatomy. RADIATION DOSE REDUCTION: This exam was performed according to the departmental dose-optimization program which includes  automated exposure control, adjustment of the mA  and/or kV according to patient size and/or use of iterative reconstruction technique. CONTRAST:  OMNIPAQUE IOHEXOL 350 MG/ML SOLN COMPARISON:  05/07/2018 for FINDINGS: VASCULAR Aorta: Normal caliber aorta without aneurysm, dissection, vasculitis or significant stenosis. Celiac: Patent without evidence of aneurysm, dissection, vasculitis or significant stenosis. SMA: Patent without evidence of aneurysm, dissection, vasculitis or significant stenosis. Renals: Both renal arteries are patent without evidence of aneurysm, dissection, vasculitis, fibromuscular dysplasia or significant stenosis. IMA: Patent without evidence of aneurysm, dissection, vasculitis or significant stenosis. Inflow: Patent without evidence of aneurysm, dissection, vasculitis or significant stenosis. Proximal Outflow: Bilateral common femoral and visualized portions of the superficial and profunda femoral arteries are patent without evidence of aneurysm, dissection, vasculitis or significant stenosis. 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 Review of the MIP images confirms the above findings. NON-VASCULAR Lower chest: No pleural effusion or airspace consolidation. Platelike atelectasis identified within the right middle lobe, lingula and both lower lobes Hepatobiliary: Multiple too small to characterize low-density liver lesions are again seen and appears similar to previous exam. Gallbladder appears normal. No wall thickening, inflammation or biliary dilatation. Pancreas: Unremarkable. No pancreatic ductal dilatation or surrounding inflammatory changes. Spleen: Mild splenomegaly measuring 13.6 cm. Stable from previous exam. Adrenals/Urinary Tract: Normal appearance of the kidneys. No hydronephrosis or mass. Punctate stone within interpolar collecting system of the left kidney noted, image  110/11. Stomach/Bowel: No signs of intraluminal contrast extravasation within the small or large bowel loops to suggest active GI bleed. Stomach appears normal. The appendix is visualized and is within normal limits. No bowel wall thickening, inflammation, or distension. Colonic diverticulosis noted. No signs of acute diverticulitis. Lymphatic: No enlarged abdominopelvic lymph nodes. Reproductive: Prostate is unremarkable. Other: New abdominal ascites identified with free fluid extending over the liver and spleen. No discrete fluid collections. No signs of hematoma. Musculoskeletal: No acute or significant osseous findings. IMPRESSION: 1. No signs of intraluminal contrast extravasation within the small or large bowel loops to suggest active GI bleed. 2. 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 New abdominal ascites. Findings may reflect progressive portal venous hypertension. 4. Splenomegaly.  Stable from previous exam 5. Nonobstructing left renal calculus. Electronically Signed   By: Nathan Rowland M.D.   On: 07/12/2022 09:44      Subjective: Patient seen and examined at bedside today.  Hemodynamically stable .comfortable.  Denies any complaints today.  On room air.  Denies any abdomen pain, nausea or vomiting.  Had a bowel movement this morning.  His heart rate trends in the range of 100s when ambulates.  Long discussion held at the bedside about discharge planning.  Counseled to avoid strenous activity for next 2 weeks  Discharge Exam: Vitals:   07/31/22 0408 07/31/22 0722  BP: 100/63 107/82  Pulse: 84 90  Resp: 16 19  Temp: 97.8 F (36.6 C) 98.2 F (36.8 C)  SpO2: 96% 96%   Vitals:   07/30/22 1930 07/30/22 2319 07/31/22 0408 07/31/22 0722  BP: 118/77 104/62 100/63 107/82  Pulse: (!) 101 97 84 90  Resp: 20 18 16 19   Temp: 97.6 F (36.4 C) 98.3 F (36.8 C) 97.8 F (36.6 C) 98.2 F (36.8 C)  TempSrc: Oral Oral Oral Oral   SpO2: 97% 96% 96% 96%    General: Pt is alert, awake, not in acute distress Cardiovascular: RRR, S1/S2 +, no rubs, no gallops Respiratory: CTA bilaterally, no  wheezing, no rhonchi Abdominal: Soft, NT, ND, bowel sounds + Extremities: no edema, no cyanosis    The results of significant diagnostics from this hospitalization (including imaging, microbiology, ancillary and laboratory) are listed below for reference.     Microbiology: Recent Results (from the past 240 hour(s))  Culture, blood (routine x 2)     Status: None (Preliminary result)   Collection Time: 07/27/22  9:40 PM   Specimen: BLOOD  Result Value Ref Range Status   Specimen Description BLOOD RIGHT ANTECUBITAL  Final   Special Requests   Final    BOTTLES DRAWN AEROBIC AND ANAEROBIC Blood Culture results may not be optimal due to an inadequate volume of blood received in culture bottles   Culture   Final    NO GROWTH 4 DAYS Performed at [facility], [address] [place], [place] [address]    Report Status PENDING  Incomplete  Culture, blood (routine x 2)     Status: None (Preliminary result)   Collection Time: 07/27/22  9:51 PM   Specimen: BLOOD  Result Value Ref Range Status   Specimen Description BLOOD LEFT ANTECUBITAL  Final   Special Requests   Final    BOTTLES DRAWN AEROBIC AND ANAEROBIC Blood Culture adequate volume   Culture   Final    NO GROWTH 4 DAYS Performed at [facility], [address] [place], [place] [address]    Report Status PENDING  Incomplete     Labs: BNP (last 3 results) Recent Labs    07/23/22 1224 07/24/22 0457 07/25/22 0519  BNP 34.6 36.3 33.3   Basic Metabolic Panel: Recent Labs  Lab 07/27/22 1508 07/28/22 0232 07/29/22 0218 07/30/22 0057 07/31/22 0059  NA 135 135 137 136 139  K 4.1 4.1 3.4* 3.4* 3.5  CL 106 105 107 106 106  CO2 21* 21* 23 21* 23  GLUCOSE 119* 138* 113* 114* 113*  BUN 6 8 9 8 6   CREATININE 0.88 0.86 0.93 0.96 0.86  CALCIUM 9.0 8.6*  8.0* 8.0* 8.3*  MG  --  2.1 2.2  --   --   PHOS  --  3.5  --   --   --    Liver Function Tests: Recent Labs  Lab 07/27/22 1508 07/28/22 0232 07/29/22 0218  AST 35 24 18  ALT 74* 61* 44  ALKPHOS 108 96 82  BILITOT 0.7 0.6 0.7  PROT 7.2 6.5 5.5*  ALBUMIN 3.6 3.2* 2.8*   Recent Labs  Lab 07/27/22 1508  LIPASE 33   Recent Labs  Lab 07/25/22 0519 07/27/22 1703  AMMONIA 25 17   CBC: Recent Labs  Lab 07/27/22 1508 07/28/22 0232 07/29/22 0218 07/30/22 0057 07/31/22 0059  WBC 14.2* 24.6* 12.8* 8.9 7.0  HGB 10.4* 9.2* 8.4* 8.2* 8.4*  HCT 34.1* 29.3* 27.6* 26.4* 27.1*  MCV 84.8 81.2 82.6 83.0 83.9  PLT 337 323 288 276 326   Cardiac Enzymes: No results for input(s): "CKTOTAL", "CKMB", "CKMBINDEX", "TROPONINI" in the last 168 hours. BNP: Invalid input(s): "POCBNP" CBG: No results for input(s): "GLUCAP" in the last 168 hours. D-Dimer No results for input(s): "DDIMER" in the last 72 hours. Hgb A1c No results for input(s): "HGBA1C" in the last 72 hours. Lipid Profile No results for input(s): "CHOL", "HDL", "LDLCALC", "TRIG", "CHOLHDL", "LDLDIRECT" in the last 72 hours. Thyroid function studies No results for input(s): "TSH", "T4TOTAL", "T3FREE", "THYROIDAB" in the last 72 hours.  Invalid input(s): "FREET3" Anemia work up No results for  input(s): "VITAMINB12", "FOLATE", "FERRITIN", "TIBC", "IRON", "RETICCTPCT" in the last 72 hours. Urinalysis    Component Value Date/Time   COLORURINE YELLOW 07/27/2022 1940   APPEARANCEUR HAZY (A) 07/27/2022 1940   LABSPEC 1.026 07/27/2022 1940   PHURINE 5.0 07/27/2022 1940   GLUCOSEU NEGATIVE 07/27/2022 1940   HGBUR NEGATIVE 07/27/2022 1940   BILIRUBINUR NEGATIVE 07/27/2022 1940   BILIRUBINUR Neg 05/30/2012 1445   KETONESUR NEGATIVE 07/27/2022 1940   PROTEINUR 30 (A) 07/27/2022 1940   UROBILINOGEN negative 05/30/2012 1445   NITRITE NEGATIVE 07/27/2022 1940   LEUKOCYTESUR NEGATIVE 07/27/2022 1940   Sepsis Labs Recent Labs   Lab 07/28/22 0232 07/29/22 0218 07/30/22 0057 07/31/22 0059  WBC 24.6* 12.8* 8.9 7.0   Microbiology Recent Results (from the past 240 hour(s))  Culture, blood (routine x 2)     Status: None (Preliminary result)   Collection Time: 07/27/22  9:40 PM   Specimen: BLOOD  Result Value Ref Range Status   Specimen Description BLOOD RIGHT ANTECUBITAL  Final   Special Requests   Final    BOTTLES DRAWN AEROBIC AND ANAEROBIC Blood Culture results may not be optimal due to an inadequate volume of blood received in culture bottles   Culture   Final    NO GROWTH 4 DAYS Performed at [facility], [address] [place], [place] [address]    Report Status PENDING  Incomplete  Culture, blood (routine x 2)     Status: None (Preliminary result)   Collection Time: 07/27/22  9:51 PM   Specimen: BLOOD  Result Value Ref Range Status   Specimen Description BLOOD LEFT ANTECUBITAL  Final   Special Requests   Final    BOTTLES DRAWN AEROBIC AND ANAEROBIC Blood Culture adequate volume   Culture   Final    NO GROWTH 4 DAYS Performed at [facility], [address], [place], [place] [address]    Report Status PENDING  Incomplete    Please note: You were cared for by a hospitalist during your hospital stay. Once you are discharged, your primary care physician will handle any further medical issues. Please note that NO REFILLS for any discharge medications will be authorized once you are discharged, as it is imperative that you return to your primary care physician (or establish a relationship with a primary care physician if you do not have one) for your post hospital discharge needs so that they can reassess your need for medications and monitor your lab values.    Time coordinating discharge: 40 minutes  SIGNED:   Burnadette Pop, MD  [facility] 07/31/2022, 10:19 AM Pager [phone]  If 7PM-7AM, please contact night-coverage [email or web address] Password [email or web address]

## 2022-08-01 LAB — CULTURE, BLOOD (ROUTINE X 2)

## 2022-08-02 ENCOUNTER — Telehealth: Payer: Self-pay

## 2022-08-02 ENCOUNTER — Encounter: Payer: Self-pay | Admitting: Physician Assistant

## 2022-08-02 ENCOUNTER — Other Ambulatory Visit (HOSPITAL_COMMUNITY): Payer: Self-pay

## 2022-08-02 NOTE — Transitions of Care (Post Inpatient/ED Visit) (Signed)
08/02/2022  Name: Nathan Rowland MRN: [id number] DOB: 1980-05-29  Today's TOC FU Call Status: Today's TOC FU Call Status:: Successful TOC FU Call Competed TOC FU Call Complete Date: 08/02/22  Transition Care Management Follow-up Telephone Call Date of Discharge: 07/31/22 Discharge Facility: [facility]) Type of Discharge: Inpatient Admission Primary Inpatient Discharge Diagnosis:: Abdominal pain How have you been since you were released from the hospital?: Better Any questions or concerns?: No  Items Reviewed: Did you receive and understand the discharge instructions provided?: Yes Medications obtained,verified, and reconciled?: Yes (Medications Reviewed) Any new allergies since your discharge?: No Dietary orders reviewed?: NA Do you have support at home?: Yes  Medications Reviewed Today: Medications Reviewed Today     Reviewed by Merleen Nicely, LPN (Licensed Practical Nurse) on 08/02/22 at 1038  Med List Status: <None>   Medication Order Taking? Sig Documenting Provider Last Dose Status Informant  amoxicillin-clavulanate (AUGMENTIN) 875-125 MG tablet [id number] Yes Take 1 tablet by mouth every 12 (twelve) hours for 3 days. Burnadette Pop, MD Taking Active   apixaban (ELIQUIS) 5 MG TABS tablet [id number] Yes Take 2 tablets (10mg ) by mouth twice daily for 3 days then 1 tablet (5mg ) twice daily thereafter Burnadette Pop, MD Taking Active   apixaban (ELIQUIS) 5 MG TABS tablet [id number] No Take 1 tablet (5 mg total) by mouth 2 (two) times daily.  Patient not taking: Reported on 08/02/2022   Burnadette Pop, MD Not Taking Active   EPINEPHrine 0.3 mg/0.3 mL IJ SOAJ injection [id number] No Inject 0.3 mg into the muscle once as needed for anaphylaxis.  Patient not taking: Reported on 07/27/2022   [provider] Not Taking Active Self           Med Note Lenor Derrick   Wed Mar 18, 2021  7:21 PM)    folic acid (FOLVITE) 1 MG tablet [id number] Yes Take 1 tablet (1 mg  total) by mouth daily. Leroy Sea, MD Taking Active Self  HYDROcodone-acetaminophen (NORCO/VICODIN) 5-325 MG tablet [id number] No Take 1 tablet by mouth every 8 (eight) hours as needed for moderate pain.  Patient not taking: Reported on 08/02/2022   Leroy Sea, MD Not Taking Active Self  Iron, Ferrous Sulfate, 325 (65 Fe) MG TABS [id number] Yes Take 325 mg by mouth daily. Leroy Sea, MD Taking Active Self  lactulose [facility]) 10 GM/15ML solution [id number] Yes Take 22.5 mLs (15 g total) by mouth 2 (two) times daily as needed for mild constipation. Joaquim Nam, MD Taking Active Self  pantoprazole (PROTONIX) 40 MG tablet [id number] Yes Take 1 tablet (40 mg total) by mouth 2 (two) times daily. Burnadette Pop, MD Taking Active             Home Care and Equipment/Supplies: Were Home Health Services Ordered?: No Any new equipment or medical supplies ordered?: No  Functional Questionnaire: Do you need assistance with bathing/showering or dressing?: No Do you need assistance with meal preparation?: No Do you need assistance with eating?: No Do you have difficulty maintaining continence: No Do you need assistance with getting out of bed/getting out of a chair/moving?: No Do you have difficulty managing or taking your medications?: No  Follow up appointments reviewed: PCP Follow-up appointment confirmed?: Yes Date of PCP follow-up appointment?: 08/09/22 Follow-up Provider: Dr Para March Do you need transportation to your follow-up appointment?: No Do you understand care options if your condition(s) worsen?: Yes-patient verbalized understanding    SIGNATURE  Woodfin Ganja LPN [facility] Nurse Health  Chief Financial Officer [phone]

## 2022-08-03 ENCOUNTER — Telehealth: Payer: Self-pay | Admitting: Hematology and Oncology

## 2022-08-04 ENCOUNTER — Inpatient Hospital Stay: Payer: BC Managed Care – PPO | Attending: Hematology and Oncology | Admitting: Hematology and Oncology

## 2022-08-04 ENCOUNTER — Other Ambulatory Visit: Payer: Self-pay

## 2022-08-04 ENCOUNTER — Inpatient Hospital Stay: Payer: BC Managed Care – PPO

## 2022-08-04 VITALS — BP 129/76 | HR 94 | Temp 98.2°F | Resp 19 | Ht 72.0 in | Wt 228.1 lb

## 2022-08-04 DIAGNOSIS — I81 Portal vein thrombosis: Secondary | ICD-10-CM | POA: Diagnosis not present

## 2022-08-04 DIAGNOSIS — K922 Gastrointestinal hemorrhage, unspecified: Secondary | ICD-10-CM | POA: Insufficient documentation

## 2022-08-04 DIAGNOSIS — D509 Iron deficiency anemia, unspecified: Secondary | ICD-10-CM | POA: Diagnosis not present

## 2022-08-04 DIAGNOSIS — J3089 Other allergic rhinitis: Secondary | ICD-10-CM | POA: Diagnosis not present

## 2022-08-04 DIAGNOSIS — D500 Iron deficiency anemia secondary to blood loss (chronic): Secondary | ICD-10-CM | POA: Diagnosis not present

## 2022-08-04 DIAGNOSIS — J3081 Allergic rhinitis due to animal (cat) (dog) hair and dander: Secondary | ICD-10-CM | POA: Diagnosis not present

## 2022-08-04 DIAGNOSIS — Z7901 Long term (current) use of anticoagulants: Secondary | ICD-10-CM | POA: Diagnosis not present

## 2022-08-04 DIAGNOSIS — J301 Allergic rhinitis due to pollen: Secondary | ICD-10-CM | POA: Diagnosis not present

## 2022-08-04 LAB — CBC WITH DIFFERENTIAL (CANCER CENTER ONLY)
Abs Immature Granulocytes: 0.17 10*3/uL — ABNORMAL HIGH (ref 0.00–0.07)
Basophils Absolute: 0.1 10*3/uL (ref 0.0–0.1)
Basophils Relative: 1 %
Eosinophils Absolute: 0.2 10*3/uL (ref 0.0–0.5)
Eosinophils Relative: 2 %
HCT: 31.3 % — ABNORMAL LOW (ref 39.0–52.0)
Hemoglobin: 9.9 g/dL — ABNORMAL LOW (ref 13.0–17.0)
Immature Granulocytes: 2 %
Lymphocytes Relative: 22 %
Lymphs Abs: 2.2 10*3/uL (ref 0.7–4.0)
MCH: 25.4 pg — ABNORMAL LOW (ref 26.0–34.0)
MCHC: 31.6 g/dL (ref 30.0–36.0)
MCV: 80.5 fL (ref 80.0–100.0)
Monocytes Absolute: 0.9 10*3/uL (ref 0.1–1.0)
Monocytes Relative: 9 %
Neutro Abs: 6.4 10*3/uL (ref 1.7–7.7)
Neutrophils Relative %: 64 %
Platelet Count: 451 10*3/uL — ABNORMAL HIGH (ref 150–400)
RBC: 3.89 MIL/uL — ABNORMAL LOW (ref 4.22–5.81)
RDW: 17.2 % — ABNORMAL HIGH (ref 11.5–15.5)
WBC Count: 9.9 10*3/uL (ref 4.0–10.5)
nRBC: 0 % (ref 0.0–0.2)

## 2022-08-04 LAB — RETIC PANEL
Immature Retic Fract: 25.8 % — ABNORMAL HIGH (ref 2.3–15.9)
RBC.: 4 MIL/uL — ABNORMAL LOW (ref 4.22–5.81)
Retic Count, Absolute: 66 10*3/uL (ref 19.0–186.0)
Retic Ct Pct: 1.7 % (ref 0.4–3.1)
Reticulocyte Hemoglobin: 20.2 pg — ABNORMAL LOW (ref >27.9)

## 2022-08-04 LAB — CMP (CANCER CENTER ONLY)
ALT: 39 U/L (ref 0–44)
AST: 18 U/L (ref 15–41)
Albumin: 4 g/dL (ref 3.5–5.0)
Alkaline Phosphatase: 89 U/L (ref 38–126)
Anion gap: 7 (ref 5–15)
BUN: 6 mg/dL (ref 6–20)
CO2: 26 mmol/L (ref 22–32)
Calcium: 9.1 mg/dL (ref 8.9–10.3)
Chloride: 108 mmol/L (ref 98–111)
Creatinine: 0.89 mg/dL (ref 0.61–1.24)
GFR, Estimated: >60 mL/min (ref >60)
Glucose, Bld: 106 mg/dL — ABNORMAL HIGH (ref 70–99)
Potassium: 4.3 mmol/L (ref 3.5–5.1)
Sodium: 141 mmol/L (ref 135–145)
Total Bilirubin: 0.3 mg/dL (ref 0.3–1.2)
Total Protein: 6.9 g/dL (ref 6.5–8.1)

## 2022-08-04 LAB — SAMPLE TO BLOOD BANK

## 2022-08-04 LAB — IRON AND IRON BINDING CAPACITY (CC-WL,HP ONLY)
Iron: 18 ug/dL — ABNORMAL LOW (ref 45–182)
Saturation Ratios: 4 % — ABNORMAL LOW (ref 17.9–39.5)
TIBC: 409 ug/dL (ref 250–450)
UIBC: 391 ug/dL — ABNORMAL HIGH (ref 117–376)

## 2022-08-04 LAB — FERRITIN: Ferritin: 30 ng/mL (ref 24–336)

## 2022-08-04 NOTE — Progress Notes (Signed)
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 ordered pre-procedure and administered intravenously within one hour of incision. ANESTHESIA/SEDATION: General - as administered by the Anesthesia department Total intra-service moderate Sedation Time: 0 minutes. The patient's level of consciousness and vital signs were monitored continuously by radiology nursing throughout the procedure under my direct supervision. CONTRAST:  200 cc FLUOROSCOPY: Radiation Exposure Index (as provided by the fluoroscopic device): 4,096 mGy Kerma COMPLICATIONS: None PROCEDURE: Secondary to complexity of the case a second physician was required, with assistance from Dr Katherina Right, VIR. Informed written consent was obtained from the patient and the patient's family after a thorough discussion of the procedural risks, benefits and alternatives. Specific risks discussed with TIPS/variceal embolization included: Bleeding, infection, vascular injury, need for further procedure/surgery, renal injury/renal failure, contrast reaction, non-target embolization, liver dysfunction/failure, hepatic encephalopathy, stroke (~1%), cardiopulmonary collapse, death. All questions were addressed. Maximal  Sterile Barrier Technique was utilized including caps, mask, sterile gowns, sterile gloves, sterile drape, hand hygiene and skin antiseptic. A timeout was performed prior to the initiation of the procedure. Patient was positioned supine position on the table, with the right upper quadrant, left upper quadrant, right inguinal region, and the right neck prepped and draped in the usual sterile fashion. Transjugular systemic venous access Ultrasound survey was then performed  of the right neck, with images stored and sent to PACs, confirming patency of the right IJ. Ultrasound guidance was then used to access the right internal jugular vein with a micro puncture kit. The wire was advanced under fluoroscopy into the right atrium, and a small incision was made. The needle was removed and the dilator was placed. The micro wire in the stiffener were removed and an 035 wire was then passed into the inferior vena cava. Ten French soft tissue dilation was performed on the wire, and then 10 [place] TIPS sheath was placed. Trans splenic access Ultrasound survey of the left upper quadrant was performed, with images stored and sent to PACs. Using ultrasound guidance, an Accustick set was used to access a splenic vein in the hilum of the spleen. The needle tip was confirmed with contrast injection under fluoroscopy. The 018 Mandril wire was passed centrally within the splenic vein. An incision was made with 11 blade scalpel and the needle was removed from the wire. The Accustick set was then advanced through the splenic tissue into the splenic vein and the Mandril wire, stiffener, and the inner dilator were removed. Injection of contrast confirmed location in the splenic vein. A standard 035 working wire was then placed through the 4 [place] sheath and the 4 [place] Accustick sheath was removed. A 25 cm 5 French Brite tip sheath was then placed through the spleen into the splenic vein. Multiple angiogram with varying obliquities were  performed. After review of the images, we elected to attempt proceeding with antegrade recanalization of the occluded splenic vein/portal vein. A stiff Glidewire and an 035 Terumo Navicross catheter probe the occlusive region. After several attempts, we elected to withdraw from an antegrade attempt at this time and acquire transhepatic access. Trans hepatic access Ultrasound survey of the right upper quadrant was performed, with images stored and sent to PACs. Using ultrasound guidance, Chiba needle was used access the peripheral right portal system. Once we confirmed position in the portal system with portal venography, micro wire was advanced centrally. The 018 wire passed fairly easily through the right portal vein and main portal vein retrograde. The Accustick system advanced into the portal system, and then the the stiffener, wire, and dilator were removed. Venogram confirmed are location within the portal system. Stiff Glidewire was then advanced as far into the portal system as possible. This a then allowed replacement of the 4 [place] dilator with a 6 [place] standard bright tip sheath, 55 cm. Through the 6 [place] system we were able to cross the occluded portal vein to the site of inflow of developing caval transformation. We then used an angled 100 cm Davis catheter to find the occluded origin of the splenic vein, and the David catheter and glidewire were passed retrograde into the splenic vein. Angiogram confirmed location. We then passed an Amplatz wire through the transhepatic access into the splenic vein, and with some luck the Amplatz wire rendezvous with the 5 French sheath from the trans splenic access. Balloon angioplasty was then performed along the length of the occluded portal system with 6 mm balloon. Angiogram was performed. 8 mm x 80 mm balloon angioplasty was then performed along the length of the portal system. Angiogram was performed. We then elected to proceed with tips, with the strategy  of an inflated balloon as the target in the portal system. 6 mm by 60 mm balloon was then inflated in the portal vein as a target. Transjugular intrahepatic portosystemic shunt Combination of Teena Dunk  wire and multipurpose angled catheter were used to select the middle hepatic vein. Venogram confirmed location within the vein. Tip sheath was then advanced over the catheter with a coaxial Amplatz wire, for a position cephalad to the transition point of the wire in the portal venous system. Venogram of the hepatic venous system was performed both in a frontal projection and a right anterior oblique projection. The Colapinto needle was then advanced through the TIPS sheath housed in the Teflon sheath over the Amplatz wire. Using frontal and oblique projections, first attempt of portal access was performed. This resulted in prolapse of the system into the IVC and the needle was removed for further access into the selected hepatic vein. Bentson wire and the multipurpose angled catheter were again used to select the middle hepatic vein. Once the catheter was distal the Amplatz wire was advanced and then the sheath was further advanced into the selected middle hepatic vein. The protective Teflon sheath and the colapinto needle were again advanced on the Amplatz wire which was then removed. From this position approximately 5 passes were necessary to access the portal venous system, by way of targeting the balloon. The balloon was in fact not punctured and the needle entered the portal vein adjacent to the balloon. Access into the portal venous system was confirmed by advancing a Glidewire distally into the splenic vein. Needle and the Teflon protective sheath were removed. A 100 cm 4 French glide cath was then advanced over the Glidewire into the portal system. The Glidewire was removed and Amplatz wire was then placed into the splenic vein to continue. A marking pigtail catheter was then advanced into the splenic vein.  Angiogram was performed to confirm location. The position of the marking pigtail with then used to estimate the length of our tips stent, and we selected 10 mm-80 mm for placement. Marking pigtail catheter was then removed on an Amplatz wire. Balloon angioplasty of 6 mm x 40 mm was then performed along the tissue tract. Sheath was advanced on the balloon as a deflated and once the sheath was into the portal vein and angiogram was performed to confirm the tips stent target. Before placing the tips stent final balloon angioplasty was performed in the splenic vein with a 10 mm by 80 mm balloon. The tip stent was then deployed and post dilated with the 10 mm balloon. Balloon angioplasty was performed along the length of the previously occluded portal vein. Balloon was removed and angiogram was performed. Pigtail catheter was advanced into the splenic vein for repeat angiogram. After review of the images we elected to proceed with coil embolization of the visualized varices in order to direct flow through the portal venous system. Embolization of varices The angled 035 catheter was placed into the splenic vein and a coaxial penumbra microcatheter was advanced. Combination of 014 fathom wire and the penumbra microcatheter were used to select the varices from the splenic vein. With safe position, coil embolization was performed with a series of penumbra Ruby coils. Repeat angiogram demonstrated some additional varices that we elected to coil embolize. The same microcatheter, microwire were used to select the varices. Coil embolization performed with additional series of penumbra Ruby coils. Repeat venogram demonstrated stasis of the embolized varices. Additional varices were filling, however, were less apparent on the entrance flow. Given that there was significant antegrade flow through the tips stent we elected not to perform further coil embolization. We did, however, elect to attempt recanalization of the superior  mesenteric  vein to improve flow into the portal vein. Revascularization of inferior mesenteric vein Microcatheter was removed after the coil embolization. The angled 035 catheter was then used to probe for the superior mesenteric vein with the use of a standard Glidewire. While we could not identify the superior mesenteric vein inflow/occluded stump, the catheter did enter the inferior mesenteric vein which was occluded at the inflow to the portal system. Angiogram was performed confirming retrograde flow and we elected to balloon angioplasty the origin. 035 wire was then advanced into the IMV and the catheter was removed. 6 mm balloon angioplasty was then performed at the IMV confluence. Balloon was removed catheter was advanced into the IMV and angiogram was performed demonstrating antegrade flow into the portal system. After review of images and the case we elected to withdraw at this time. The transhepatic portal vein access was withdrawn and embolized with a combination of MRey coils and Gel-Foam slurry. The trans splenic portal vein access was withdrawn and embolized with a series of tornado and MRey coils. The transjugular access was removed with manual pressure for hemostasis. Patient remained hemodynamically stable throughout. No complications were encountered.  No significant blood loss. Benson wire was then passed through the Colapinto needle into the inferior mesenteric vein. Needle was removed and disposed. Pigtail marking catheter was then advanced over the wire for portal venogram confirming location. 6 mm balloon angioplasty was then performed of the soft tissue tract. Sheath was advanced over the deflating balloon into the portal venous system. Multipurpose angled catheter was advanced into the distal splenic vein in the splenic hilum, for splenic venogram. IMPRESSION: Status post ultrasound-guided right IJ venous access, ultrasound-guided trans splenic portal vein access, ultrasound-guided  transhepatic portal vein access for revascularization of occluded portal vein and IMV, TIPS, and coil embolization of competing varices, as treatment for repeat episodes of GI bleeding related to portal hypertension. Signed, Yvone Neu. Miachel Roux, RPVI [facility] Radiology Electronically Signed   By: Gilmer Mor D.O.   On: 07/22/2022 08:59   IR US Guide Vasc Access Right  Result Date: 07/22/2022 CLINICAL DATA:  42 year old male with a history of chronic portal vein thrombosis and gastrointestinal bleeding, presents for portal vein recanalization, possible tips and possible embolization EXAM: ULTRASOUND-GUIDED ACCESS RIGHT INTERNAL JUGULAR VEIN ULTRASOUND-GUIDED TRANS SPLENIC ACCESS INTO THE PORTAL VEIN ULTRASOUND-GUIDED TRANSHEPATIC ACCESS INTO THE PORTAL VENOUS SYSTEM BALLOON ANGIOPLASTY AND RECANALIZATION OF OCCLUDED PORTAL VEIN TRANSJUGULAR INTRAHEPATIC PORTOSYSTEMIC SHUNT COIL EMBOLIZATION OF MULTIPLE COMPETING VARICES MEDICATIONS: As antibiotic prophylaxis, Rocephin 1 gm IV was ordered pre-procedure and administered intravenously within one hour of incision. ANESTHESIA/SEDATION: General - as administered by the Anesthesia department Total intra-service moderate Sedation Time: 0 minutes. The patient's level of consciousness and vital signs were monitored continuously by radiology nursing throughout the procedure under my direct supervision. CONTRAST:  200 cc FLUOROSCOPY: Radiation Exposure Index (as provided by the fluoroscopic device): 4,096 mGy Kerma COMPLICATIONS: None PROCEDURE: Secondary to complexity of the case a second physician was required, with assistance from Dr Katherina Right, VIR. Informed written consent was obtained from the patient and the patient's family after a thorough discussion of the procedural risks, benefits and alternatives. Specific risks discussed with TIPS/variceal embolization included: Bleeding, infection, vascular injury,  need for further procedure/surgery, renal injury/renal failure, contrast reaction, non-target embolization, liver dysfunction/failure, hepatic encephalopathy, stroke (~1%), cardiopulmonary collapse, death. All questions were addressed. Maximal Sterile Barrier Technique was utilized including caps, mask, sterile gowns, sterile gloves, sterile drape, hand hygiene  and skin antiseptic. A timeout was performed prior to the initiation of the procedure. Patient was positioned supine position on the table, with the right upper quadrant, left upper quadrant, right inguinal region, and the right neck prepped and draped in the usual sterile fashion. Transjugular systemic venous access Ultrasound survey was then performed of the right neck, with images stored and sent to PACs, confirming patency of the right IJ. Ultrasound guidance was then used to access the right internal jugular vein with a micro puncture kit. The wire was advanced under fluoroscopy into the right atrium, and a small incision was made. The needle was removed and the dilator was placed. The micro wire in the stiffener were removed and an 035 wire was then passed into the inferior vena cava. Ten French soft tissue dilation was performed on the wire, and then 10 [place] TIPS sheath was placed. Trans splenic access Ultrasound survey of the left upper quadrant was performed, with images stored and sent to PACs. Using ultrasound guidance, an Accustick set was used to access a splenic vein in the hilum of the spleen. The needle tip was confirmed with contrast injection under fluoroscopy. The 018 Mandril wire was passed centrally within the splenic vein. An incision was made with 11 blade scalpel and the needle was removed from the wire. The Accustick set was then advanced through the splenic tissue into the splenic vein and the Mandril wire, stiffener, and the inner dilator were removed. Injection of contrast confirmed location in the splenic vein. A standard 035  working wire was then placed through the 4 [place] sheath and the 4 [place] Accustick sheath was removed. A 25 cm 5 French Brite tip sheath was then placed through the spleen into the splenic vein. Multiple angiogram with varying obliquities were performed. After review of the images, we elected to attempt proceeding with antegrade recanalization of the occluded splenic vein/portal vein. A stiff Glidewire and an 035 Terumo Navicross catheter probe the occlusive region. After several attempts, we elected to withdraw from an antegrade attempt at this time and acquire transhepatic access. Trans hepatic access Ultrasound survey of the right upper quadrant was performed, with images stored and sent to PACs. Using ultrasound guidance, Chiba needle was used access the peripheral right portal system. Once we confirmed position in the portal system with portal venography, micro wire was advanced centrally. The 018 wire passed fairly easily through the right portal vein and main portal vein retrograde. The Accustick system advanced into the portal system, and then the the stiffener, wire, and dilator were removed. Venogram confirmed are location within the portal system. Stiff Glidewire was then advanced as far into the portal system as possible. This a then allowed replacement of the 4 [place] dilator with a 6 [place] standard bright tip sheath, 55 cm. Through the 6 [place] system we were able to cross the occluded portal vein to the site of inflow of developing caval transformation. We then used an angled 100 cm Davis catheter to find the occluded origin of the splenic vein, and the David catheter and glidewire were passed retrograde into the splenic vein. Angiogram confirmed location. We then passed an Amplatz wire through the transhepatic access into the splenic vein, and with some luck the Amplatz wire rendezvous with the 5 French sheath from the trans splenic access. Balloon angioplasty was then performed along the length of  the occluded portal system with 6 mm balloon. Angiogram was performed. 8 mm x 80 mm balloon angioplasty was then performed  along the length of the portal system. Angiogram was performed. We then elected to proceed with tips, with the strategy of an inflated balloon as the target in the portal system. 6 mm by 60 mm balloon was then inflated in the portal vein as a target. Transjugular intrahepatic portosystemic shunt Combination of Benson wire and multipurpose angled catheter were used to select the middle hepatic vein. Venogram confirmed location within the vein. Tip sheath was then advanced over the catheter with a coaxial Amplatz wire, for a position cephalad to the transition point of the wire in the portal venous system. Venogram of the hepatic venous system was performed both in a frontal projection and a right anterior oblique projection. The Colapinto needle was then advanced through the TIPS sheath housed in the Teflon sheath over the Amplatz wire. Using frontal and oblique projections, first attempt of portal access was performed. This resulted in prolapse of the system into the IVC and the needle was removed for further access into the selected hepatic vein. Bentson wire and the multipurpose angled catheter were again used to select the middle hepatic vein. Once the catheter was distal the Amplatz wire was advanced and then the sheath was further advanced into the selected middle hepatic vein. The protective Teflon sheath and the colapinto needle were again advanced on the Amplatz wire which was then removed. From this position approximately 5 passes were necessary to access the portal venous system, by way of targeting the balloon. The balloon was in fact not punctured and the needle entered the portal vein adjacent to the balloon. Access into the portal venous system was confirmed by advancing a Glidewire distally into the splenic vein. Needle and the Teflon protective sheath were removed. A 100 cm 4  French glide cath was then advanced over the Glidewire into the portal system. The Glidewire was removed and Amplatz wire was then placed into the splenic vein to continue. A marking pigtail catheter was then advanced into the splenic vein. Angiogram was performed to confirm location. The position of the marking pigtail with then used to estimate the length of our tips stent, and we selected 10 mm-80 mm for placement. Marking pigtail catheter was then removed on an Amplatz wire. Balloon angioplasty of 6 mm x 40 mm was then performed along the tissue tract. Sheath was advanced on the balloon as a deflated and once the sheath was into the portal vein and angiogram was performed to confirm the tips stent target. Before placing the tips stent final balloon angioplasty was performed in the splenic vein with a 10 mm by 80 mm balloon. The tip stent was then deployed and post dilated with the 10 mm balloon. Balloon angioplasty was performed along the length of the previously occluded portal vein. Balloon was removed and angiogram was performed. Pigtail catheter was advanced into the splenic vein for repeat angiogram. After review of the images we elected to proceed with coil embolization of the visualized varices in order to direct flow through the portal venous system. Embolization of varices The angled 035 catheter was placed into the splenic vein and a coaxial penumbra microcatheter was advanced. Combination of 014 fathom wire and the penumbra microcatheter were used to select the varices from the splenic vein. With safe position, coil embolization was performed with a series of penumbra Ruby coils. Repeat angiogram demonstrated some additional varices that we elected to coil embolize. The same microcatheter, microwire were used to select the varices. Coil embolization performed with additional series of  penumbra Ruby coils. Repeat venogram demonstrated stasis of the embolized varices. Additional varices were filling,  however, were less apparent on the entrance flow. Given that there was significant antegrade flow through the tips stent we elected not to perform further coil embolization. We did, however, elect to attempt recanalization of the superior mesenteric vein to improve flow into the portal vein. Revascularization of inferior mesenteric vein Microcatheter was removed after the coil embolization. The angled 035 catheter was then used to probe for the superior mesenteric vein with the use of a standard Glidewire. While we could not identify the superior mesenteric vein inflow/occluded stump, the catheter did enter the inferior mesenteric vein which was occluded at the inflow to the portal system. Angiogram was performed confirming retrograde flow and we elected to balloon angioplasty the origin. 035 wire was then advanced into the IMV and the catheter was removed. 6 mm balloon angioplasty was then performed at the IMV confluence. Balloon was removed catheter was advanced into the IMV and angiogram was performed demonstrating antegrade flow into the portal system. After review of images and the case we elected to withdraw at this time. The transhepatic portal vein access was withdrawn and embolized with a combination of MRey coils and Gel-Foam slurry. The trans splenic portal vein access was withdrawn and embolized with a series of tornado and MRey coils. The transjugular access was removed with manual pressure for hemostasis. Patient remained hemodynamically stable throughout. No complications were encountered.  No significant blood loss. Benson wire was then passed through the Colapinto needle into the inferior mesenteric vein. Needle was removed and disposed. Pigtail marking catheter was then advanced over the wire for portal venogram confirming location. 6 mm balloon angioplasty was then performed of the soft tissue tract. Sheath was advanced over the deflating balloon into the portal venous system. Multipurpose angled  catheter was advanced into the distal splenic vein in the splenic hilum, for splenic venogram. IMPRESSION: Status post ultrasound-guided right IJ venous access, ultrasound-guided trans splenic portal vein access, ultrasound-guided transhepatic portal vein access for revascularization of occluded portal vein and IMV, TIPS, and coil embolization of competing varices, as treatment for repeat episodes of GI bleeding related to portal hypertension. Signed, Yvone Neu. Miachel Roux, RPVI [facility] Radiology Electronically Signed   By: Gilmer Mor D.O.   On: 07/22/2022 08:59   IR US Guide Vasc Access Left  Result Date: 07/22/2022 CLINICAL DATA:  42 year old male with a history of chronic portal vein thrombosis and gastrointestinal bleeding, presents for portal vein recanalization, possible tips and possible embolization EXAM: ULTRASOUND-GUIDED ACCESS RIGHT INTERNAL JUGULAR VEIN ULTRASOUND-GUIDED TRANS SPLENIC ACCESS INTO THE PORTAL VEIN ULTRASOUND-GUIDED TRANSHEPATIC ACCESS INTO THE PORTAL VENOUS SYSTEM BALLOON ANGIOPLASTY AND RECANALIZATION OF OCCLUDED PORTAL VEIN TRANSJUGULAR INTRAHEPATIC PORTOSYSTEMIC SHUNT COIL EMBOLIZATION OF MULTIPLE COMPETING VARICES MEDICATIONS: As antibiotic prophylaxis, Rocephin 1 gm IV was ordered pre-procedure and administered intravenously within one hour of incision. ANESTHESIA/SEDATION: General - as administered by the Anesthesia department Total intra-service moderate Sedation Time: 0 minutes. The patient's level of consciousness and vital signs were monitored continuously by radiology nursing throughout the procedure under my direct supervision. CONTRAST:  200 cc FLUOROSCOPY: Radiation Exposure Index (as provided by the fluoroscopic device): 4,096 mGy Kerma COMPLICATIONS: None PROCEDURE: Secondary to complexity of the case a second physician was required, with assistance from Dr Katherina Right, VIR. Informed written consent was obtained  from the patient and the patient's family after a thorough discussion of the procedural risks, benefits  and alternatives. Specific risks discussed with TIPS/variceal embolization included: Bleeding, infection, vascular injury, need for further procedure/surgery, renal injury/renal failure, contrast reaction, non-target embolization, liver dysfunction/failure, hepatic encephalopathy, stroke (~1%), cardiopulmonary collapse, death. All questions were addressed. Maximal Sterile Barrier Technique was utilized including caps, mask, sterile gowns, sterile gloves, sterile drape, hand hygiene and skin antiseptic. A timeout was performed prior to the initiation of the procedure. Patient was positioned supine position on the table, with the right upper quadrant, left upper quadrant, right inguinal region, and the right neck prepped and draped in the usual sterile fashion. Transjugular systemic venous access Ultrasound survey was then performed of the right neck, with images stored and sent to PACs, confirming patency of the right IJ. Ultrasound guidance was then used to access the right internal jugular vein with a micro puncture kit. The wire was advanced under fluoroscopy into the right atrium, and a small incision was made. The needle was removed and the dilator was placed. The micro wire in the stiffener were removed and an 035 wire was then passed into the inferior vena cava. Ten French soft tissue dilation was performed on the wire, and then 10 [place] TIPS sheath was placed. Trans splenic access Ultrasound survey of the left upper quadrant was performed, with images stored and sent to PACs. Using ultrasound guidance, an Accustick set was used to access a splenic vein in the hilum of the spleen. The needle tip was confirmed with contrast injection under fluoroscopy. The 018 Mandril wire was passed centrally within the splenic vein. An incision was made with 11 blade scalpel and the needle was removed from the wire. The  Accustick set was then advanced through the splenic tissue into the splenic vein and the Mandril wire, stiffener, and the inner dilator were removed. Injection of contrast confirmed location in the splenic vein. A standard 035 working wire was then placed through the 4 [place] sheath and the 4 [place] Accustick sheath was removed. A 25 cm 5 French Brite tip sheath was then placed through the spleen into the splenic vein. Multiple angiogram with varying obliquities were performed. After review of the images, we elected to attempt proceeding with antegrade recanalization of the occluded splenic vein/portal vein. A stiff Glidewire and an 035 Terumo Navicross catheter probe the occlusive region. After several attempts, we elected to withdraw from an antegrade attempt at this time and acquire transhepatic access. Trans hepatic access Ultrasound survey of the right upper quadrant was performed, with images stored and sent to PACs. Using ultrasound guidance, Chiba needle was used access the peripheral right portal system. Once we confirmed position in the portal system with portal venography, micro wire was advanced centrally. The 018 wire passed fairly easily through the right portal vein and main portal vein retrograde. The Accustick system advanced into the portal system, and then the the stiffener, wire, and dilator were removed. Venogram confirmed are location within the portal system. Stiff Glidewire was then advanced as far into the portal system as possible. This a then allowed replacement of the 4 [place] dilator with a 6 [place] standard bright tip sheath, 55 cm. Through the 6 [place] system we were able to cross the occluded portal vein to the site of inflow of developing caval transformation. We then used an angled 100 cm Davis catheter to find the occluded origin of the splenic vein, and the David catheter and glidewire were passed retrograde into the splenic vein. Angiogram confirmed location. We then passed an  Amplatz wire through the  transhepatic access into the splenic vein, and with some luck the Amplatz wire rendezvous with the 5 French sheath from the trans splenic access. Balloon angioplasty was then performed along the length of the occluded portal system with 6 mm balloon. Angiogram was performed. 8 mm x 80 mm balloon angioplasty was then performed along the length of the portal system. Angiogram was performed. We then elected to proceed with tips, with the strategy of an inflated balloon as the target in the portal system. 6 mm by 60 mm balloon was then inflated in the portal vein as a target. Transjugular intrahepatic portosystemic shunt Combination of Benson wire and multipurpose angled catheter were used to select the middle hepatic vein. Venogram confirmed location within the vein. Tip sheath was then advanced over the catheter with a coaxial Amplatz wire, for a position cephalad to the transition point of the wire in the portal venous system. Venogram of the hepatic venous system was performed both in a frontal projection and a right anterior oblique projection. The Colapinto needle was then advanced through the TIPS sheath housed in the Teflon sheath over the Amplatz wire. Using frontal and oblique projections, first attempt of portal access was performed. This resulted in prolapse of the system into the IVC and the needle was removed for further access into the selected hepatic vein. Bentson wire and the multipurpose angled catheter were again used to select the middle hepatic vein. Once the catheter was distal the Amplatz wire was advanced and then the sheath was further advanced into the selected middle hepatic vein. The protective Teflon sheath and the colapinto needle were again advanced on the Amplatz wire which was then removed. From this position approximately 5 passes were necessary to access the portal venous system, by way of targeting the balloon. The balloon was in fact not punctured and the  needle entered the portal vein adjacent to the balloon. Access into the portal venous system was confirmed by advancing a Glidewire distally into the splenic vein. Needle and the Teflon protective sheath were removed. A 100 cm 4 French glide cath was then advanced over the Glidewire into the portal system. The Glidewire was removed and Amplatz wire was then placed into the splenic vein to continue. A marking pigtail catheter was then advanced into the splenic vein. Angiogram was performed to confirm location. The position of the marking pigtail with then used to estimate the length of our tips stent, and we selected 10 mm-80 mm for placement. Marking pigtail catheter was then removed on an Amplatz wire. Balloon angioplasty of 6 mm x 40 mm was then performed along the tissue tract. Sheath was advanced on the balloon as a deflated and once the sheath was into the portal vein and angiogram was performed to confirm the tips stent target. Before placing the tips stent final balloon angioplasty was performed in the splenic vein with a 10 mm by 80 mm balloon. The tip stent was then deployed and post dilated with the 10 mm balloon. Balloon angioplasty was performed along the length of the previously occluded portal vein. Balloon was removed and angiogram was performed. Pigtail catheter was advanced into the splenic vein for repeat angiogram. After review of the images we elected to proceed with coil embolization of the visualized varices in order to direct flow through the portal venous system. Embolization of varices The angled 035 catheter was placed into the splenic vein and a coaxial penumbra microcatheter was advanced. Combination of 014 fathom wire and the penumbra  microcatheter were used to select the varices from the splenic vein. With safe position, coil embolization was performed with a series of penumbra Ruby coils. Repeat angiogram demonstrated some additional varices that we elected to coil embolize. The same  microcatheter, microwire were used to select the varices. Coil embolization performed with additional series of penumbra Ruby coils. Repeat venogram demonstrated stasis of the embolized varices. Additional varices were filling, however, were less apparent on the entrance flow. Given that there was significant antegrade flow through the tips stent we elected not to perform further coil embolization. We did, however, elect to attempt recanalization of the superior mesenteric vein to improve flow into the portal vein. Revascularization of inferior mesenteric vein Microcatheter was removed after the coil embolization. The angled 035 catheter was then used to probe for the superior mesenteric vein with the use of a standard Glidewire. While we could not identify the superior mesenteric vein inflow/occluded stump, the catheter did enter the inferior mesenteric vein which was occluded at the inflow to the portal system. Angiogram was performed confirming retrograde flow and we elected to balloon angioplasty the origin. 035 wire was then advanced into the IMV and the catheter was removed. 6 mm balloon angioplasty was then performed at the IMV confluence. Balloon was removed catheter was advanced into the IMV and angiogram was performed demonstrating antegrade flow into the portal system. After review of images and the case we elected to withdraw at this time. The transhepatic portal vein access was withdrawn and embolized with a combination of MRey coils and Gel-Foam slurry. The trans splenic portal vein access was withdrawn and embolized with a series of tornado and MRey coils. The transjugular access was removed with manual pressure for hemostasis. Patient remained hemodynamically stable throughout. No complications were encountered.  No significant blood loss. Benson wire was then passed through the Colapinto needle into the inferior mesenteric vein. Needle was removed and disposed. Pigtail marking catheter was then  advanced over the wire for portal venogram confirming location. 6 mm balloon angioplasty was then performed of the soft tissue tract. Sheath was advanced over the deflating balloon into the portal venous system. Multipurpose angled catheter was advanced into the distal splenic vein in the splenic hilum, for splenic venogram. IMPRESSION: Status post ultrasound-guided right IJ venous access, ultrasound-guided trans splenic portal vein access, ultrasound-guided transhepatic portal vein access for revascularization of occluded portal vein and IMV, TIPS, and coil embolization of competing varices, as treatment for repeat episodes of GI bleeding related to portal hypertension. Signed, Yvone Neu. Miachel Roux, RPVI [facility] Radiology Electronically Signed   By: Gilmer Mor D.O.   On: 07/22/2022 08:59   IR EMBO ART  VEN HEMORR LYMPH EXTRAV  INC GUIDE ROADMAPPING  Result Date: 07/22/2022 CLINICAL DATA:  42 year old male with a history of chronic portal vein thrombosis and gastrointestinal bleeding, presents for portal vein recanalization, possible tips and possible embolization EXAM: ULTRASOUND-GUIDED ACCESS RIGHT INTERNAL JUGULAR VEIN ULTRASOUND-GUIDED TRANS SPLENIC ACCESS INTO THE PORTAL VEIN ULTRASOUND-GUIDED TRANSHEPATIC ACCESS INTO THE PORTAL VENOUS SYSTEM BALLOON ANGIOPLASTY AND RECANALIZATION OF OCCLUDED PORTAL VEIN TRANSJUGULAR INTRAHEPATIC PORTOSYSTEMIC SHUNT COIL EMBOLIZATION OF MULTIPLE COMPETING VARICES MEDICATIONS: As antibiotic prophylaxis, Rocephin 1 gm IV was ordered pre-procedure and administered intravenously within one hour of incision. ANESTHESIA/SEDATION: General - as administered by the Anesthesia department Total intra-service moderate Sedation Time: 0 minutes. The patient's level of consciousness and vital signs were monitored continuously by radiology nursing throughout the procedure under my direct supervision.  CONTRAST:  200 cc  FLUOROSCOPY: Radiation Exposure Index (as provided by the fluoroscopic device): 4,096 mGy Kerma COMPLICATIONS: None PROCEDURE: Secondary to complexity of the case a second physician was required, with assistance from Dr Katherina Right, VIR. Informed written consent was obtained from the patient and the patient's family after a thorough discussion of the procedural risks, benefits and alternatives. Specific risks discussed with TIPS/variceal embolization included: Bleeding, infection, vascular injury, need for further procedure/surgery, renal injury/renal failure, contrast reaction, non-target embolization, liver dysfunction/failure, hepatic encephalopathy, stroke (~1%), cardiopulmonary collapse, death. All questions were addressed. Maximal Sterile Barrier Technique was utilized including caps, mask, sterile gowns, sterile gloves, sterile drape, hand hygiene and skin antiseptic. A timeout was performed prior to the initiation of the procedure. Patient was positioned supine position on the table, with the right upper quadrant, left upper quadrant, right inguinal region, and the right neck prepped and draped in the usual sterile fashion. Transjugular systemic venous access Ultrasound survey was then performed of the right neck, with images stored and sent to PACs, confirming patency of the right IJ. Ultrasound guidance was then used to access the right internal jugular vein with a micro puncture kit. The wire was advanced under fluoroscopy into the right atrium, and a small incision was made. The needle was removed and the dilator was placed. The micro wire in the stiffener were removed and an 035 wire was then passed into the inferior vena cava. Ten French soft tissue dilation was performed on the wire, and then 10 [place] TIPS sheath was placed. Trans splenic access Ultrasound survey of the left upper quadrant was performed, with images stored and sent to PACs. Using ultrasound guidance, an Accustick set was used to access  a splenic vein in the hilum of the spleen. The needle tip was confirmed with contrast injection under fluoroscopy. The 018 Mandril wire was passed centrally within the splenic vein. An incision was made with 11 blade scalpel and the needle was removed from the wire. The Accustick set was then advanced through the splenic tissue into the splenic vein and the Mandril wire, stiffener, and the inner dilator were removed. Injection of contrast confirmed location in the splenic vein. A standard 035 working wire was then placed through the 4 [place] sheath and the 4 [place] Accustick sheath was removed. A 25 cm 5 French Brite tip sheath was then placed through the spleen into the splenic vein. Multiple angiogram with varying obliquities were performed. After review of the images, we elected to attempt proceeding with antegrade recanalization of the occluded splenic vein/portal vein. A stiff Glidewire and an 035 Terumo Navicross catheter probe the occlusive region. After several attempts, we elected to withdraw from an antegrade attempt at this time and acquire transhepatic access. Trans hepatic access Ultrasound survey of the right upper quadrant was performed, with images stored and sent to PACs. Using ultrasound guidance, Chiba needle was used access the peripheral right portal system. Once we confirmed position in the portal system with portal venography, micro wire was advanced centrally. The 018 wire passed fairly easily through the right portal vein and main portal vein retrograde. The Accustick system advanced into the portal system, and then the the stiffener, wire, and dilator were removed. Venogram confirmed are location within the portal system. Stiff Glidewire was then advanced as far into the portal system as possible. This a then allowed replacement of the 4 [place] dilator with a 6 [place] standard bright tip sheath, 55 cm. Through the 6 [place] system we  were able to cross the occluded portal vein to the site of  inflow of developing caval transformation. We then used an angled 100 cm Davis catheter to find the occluded origin of the splenic vein, and the David catheter and glidewire were passed retrograde into the splenic vein. Angiogram confirmed location. We then passed an Amplatz wire through the transhepatic access into the splenic vein, and with some luck the Amplatz wire rendezvous with the 5 French sheath from the trans splenic access. Balloon angioplasty was then performed along the length of the occluded portal system with 6 mm balloon. Angiogram was performed. 8 mm x 80 mm balloon angioplasty was then performed along the length of the portal system. Angiogram was performed. We then elected to proceed with tips, with the strategy of an inflated balloon as the target in the portal system. 6 mm by 60 mm balloon was then inflated in the portal vein as a target. Transjugular intrahepatic portosystemic shunt Combination of Benson wire and multipurpose angled catheter were used to select the middle hepatic vein. Venogram confirmed location within the vein. Tip sheath was then advanced over the catheter with a coaxial Amplatz wire, for a position cephalad to the transition point of the wire in the portal venous system. Venogram of the hepatic venous system was performed both in a frontal projection and a right anterior oblique projection. The Colapinto needle was then advanced through the TIPS sheath housed in the Teflon sheath over the Amplatz wire. Using frontal and oblique projections, first attempt of portal access was performed. This resulted in prolapse of the system into the IVC and the needle was removed for further access into the selected hepatic vein. Bentson wire and the multipurpose angled catheter were again used to select the middle hepatic vein. Once the catheter was distal the Amplatz wire was advanced and then the sheath was further advanced into the selected middle hepatic vein. The protective Teflon  sheath and the colapinto needle were again advanced on the Amplatz wire which was then removed. From this position approximately 5 passes were necessary to access the portal venous system, by way of targeting the balloon. The balloon was in fact not punctured and the needle entered the portal vein adjacent to the balloon. Access into the portal venous system was confirmed by advancing a Glidewire distally into the splenic vein. Needle and the Teflon protective sheath were removed. A 100 cm 4 French glide cath was then advanced over the Glidewire into the portal system. The Glidewire was removed and Amplatz wire was then placed into the splenic vein to continue. A marking pigtail catheter was then advanced into the splenic vein. Angiogram was performed to confirm location. The position of the marking pigtail with then used to estimate the length of our tips stent, and we selected 10 mm-80 mm for placement. Marking pigtail catheter was then removed on an Amplatz wire. Balloon angioplasty of 6 mm x 40 mm was then performed along the tissue tract. Sheath was advanced on the balloon as a deflated and once the sheath was into the portal vein and angiogram was performed to confirm the tips stent target. Before placing the tips stent final balloon angioplasty was performed in the splenic vein with a 10 mm by 80 mm balloon. The tip stent was then deployed and post dilated with the 10 mm balloon. Balloon angioplasty was performed along the length of the previously occluded portal vein. Balloon was removed and angiogram was performed. Pigtail catheter was advanced into  the splenic vein for repeat angiogram. After review of the images we elected to proceed with coil embolization of the visualized varices in order to direct flow through the portal venous system. Embolization of varices The angled 035 catheter was placed into the splenic vein and a coaxial penumbra microcatheter was advanced. Combination of 014 fathom wire and  the penumbra microcatheter were used to select the varices from the splenic vein. With safe position, coil embolization was performed with a series of penumbra Ruby coils. Repeat angiogram demonstrated some additional varices that we elected to coil embolize. The same microcatheter, microwire were used to select the varices. Coil embolization performed with additional series of penumbra Ruby coils. Repeat venogram demonstrated stasis of the embolized varices. Additional varices were filling, however, were less apparent on the entrance flow. Given that there was significant antegrade flow through the tips stent we elected not to perform further coil embolization. We did, however, elect to attempt recanalization of the superior mesenteric vein to improve flow into the portal vein. Revascularization of inferior mesenteric vein Microcatheter was removed after the coil embolization. The angled 035 catheter was then used to probe for the superior mesenteric vein with the use of a standard Glidewire. While we could not identify the superior mesenteric vein inflow/occluded stump, the catheter did enter the inferior mesenteric vein which was occluded at the inflow to the portal system. Angiogram was performed confirming retrograde flow and we elected to balloon angioplasty the origin. 035 wire was then advanced into the IMV and the catheter was removed. 6 mm balloon angioplasty was then performed at the IMV confluence. Balloon was removed catheter was advanced into the IMV and angiogram was performed demonstrating antegrade flow into the portal system. After review of images and the case we elected to withdraw at this time. The transhepatic portal vein access was withdrawn and embolized with a combination of MRey coils and Gel-Foam slurry. The trans splenic portal vein access was withdrawn and embolized with a series of tornado and MRey coils. The transjugular access was removed with manual pressure for hemostasis. Patient  remained hemodynamically stable throughout. No complications were encountered.  No significant blood loss. Benson wire was then passed through the Colapinto needle into the inferior mesenteric vein. Needle was removed and disposed. Pigtail marking catheter was then advanced over the wire for portal venogram confirming location. 6 mm balloon angioplasty was then performed of the soft tissue tract. Sheath was advanced over the deflating balloon into the portal venous system. Multipurpose angled catheter was advanced into the distal splenic vein in the splenic hilum, for splenic venogram. IMPRESSION: Status post ultrasound-guided right IJ venous access, ultrasound-guided trans splenic portal vein access, ultrasound-guided transhepatic portal vein access for revascularization of occluded portal vein and IMV, TIPS, and coil embolization of competing varices, as treatment for repeat episodes of GI bleeding related to portal hypertension. Signed, Yvone Neu. Miachel Roux, RPVI [facility] Radiology Electronically Signed   By: Gilmer Mor D.O.   On: 07/22/2022 08:59   IR US Guide Vasc Access Right  Result Date: 07/22/2022 CLINICAL DATA:  42 year old male with a history of chronic portal vein thrombosis and gastrointestinal bleeding, presents for portal vein recanalization, possible tips and possible embolization EXAM: ULTRASOUND-GUIDED ACCESS RIGHT INTERNAL JUGULAR VEIN ULTRASOUND-GUIDED TRANS SPLENIC ACCESS INTO THE PORTAL VEIN ULTRASOUND-GUIDED TRANSHEPATIC ACCESS INTO THE PORTAL VENOUS SYSTEM BALLOON ANGIOPLASTY AND RECANALIZATION OF OCCLUDED PORTAL VEIN TRANSJUGULAR INTRAHEPATIC PORTOSYSTEMIC SHUNT COIL EMBOLIZATION OF MULTIPLE COMPETING VARICES MEDICATIONS: As  antibiotic prophylaxis, Rocephin 1 gm IV was ordered pre-procedure and administered intravenously within one hour of incision. ANESTHESIA/SEDATION: General - as administered by the Anesthesia department  Total intra-service moderate Sedation Time: 0 minutes. The patient's level of consciousness and vital signs were monitored continuously by radiology nursing throughout the procedure under my direct supervision. CONTRAST:  200 cc FLUOROSCOPY: Radiation Exposure Index (as provided by the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

## 2022-08-05 ENCOUNTER — Encounter: Payer: Self-pay | Admitting: *Deleted

## 2022-08-09 ENCOUNTER — Ambulatory Visit: Payer: BC Managed Care – PPO | Admitting: Family Medicine

## 2022-08-09 ENCOUNTER — Encounter: Payer: Self-pay | Admitting: Family Medicine

## 2022-08-09 VITALS — BP 116/64 | HR 96 | Temp 97.8°F | Ht 72.0 in | Wt 232.0 lb

## 2022-08-09 DIAGNOSIS — Z95828 Presence of other vascular implants and grafts: Secondary | ICD-10-CM

## 2022-08-09 DIAGNOSIS — I2699 Other pulmonary embolism without acute cor pulmonale: Secondary | ICD-10-CM | POA: Diagnosis not present

## 2022-08-09 NOTE — Patient Instructions (Signed)
I'll check with Dr. Loreta Ave about follow up.  I'll check with hematology and GI etc about returning to full work- that may depend on your blood counts.   If you aren't having two BMs per day, then restart lactulose.    Take care.  Glad to see you.

## 2022-08-09 NOTE — Progress Notes (Unsigned)
  Brief/Interim Summary: Nathan Rowland is a 42 year old male with history of cirrhotic portal hypertension, splenomegaly, portal vein thrombosis on Eliquis, hypertension esophageal varices who underwent TIPS, embolization of varices on 5/22 by IR presented with abdominal pain.  Patient was recently admitted for recurrent GI bleed and acute blood loss anemia and received several units of PRBC.  No report of hematochezia or melena.  Abdominal x-ray on presentation showed ileus.  Abdomen CT with contrast showed incidental finding of partially occlusive pulmonary emboli in the left lower lobe.  CT angiogram showed   pulmonary emboli in segmental and subsegmental sized branches to the left lower lobe,extensive passive atelectasis in the lower lobes of the lungs bilaterally with some airspace consolidation in the left lower lobe as well as areas of alveolar hemorrhage in the setting of acute pulmonary infarct.  General surgery /GI consulted for the finding of ileus.  Also started heparin drip.  Labs showed leukocytosis. Patient now with return of bowel function, NG tube removed.  Ileus has resolved, started on solid diet and he is tolerating.  IV heparin has been changed to Eliquis and his hemoglobin has remained stable in the range of 8.  Medically stable for discharge to home today   Following problems were addressed during the hospitalization:   Abdominal pain/ileus: Presented with abdominal distention.  NG tube was placed , general surgery/GI  consulted. Ileus has resolved.  Patient having regular bowel movement   PE: CT imagings as above.  Started on heparin drip.  Changed to Eliquis    Acute hypoxic respiratory failure: This is secondary to PE and also bilateral atelectasis, possible alveolar hemorrhage in the setting of acute pulmonary infarct.  Weaned to room air.   Noncirrhotic portal hypertension/esophageal varices: Recent history of recurrent GI bleed.  Status post TIPS procedure on 5/22 by IR.  IR  consulted here.  TIPS appears patent without evidence of procedural related complication.  Currently on Protonix.  GI consulted Hemoglobin in the range of 8 , check CBC in a week   Hypokalemia: Supplemented with potassium.   Obesity: BMI 31  ==================== He is going to have iron infusion per hematology.   Not on lactulose and having 2 BMs per day.    He went back to light duty at work.  No pain from TIPS procedure.    When does patient need to see Dr Yvone Neu. Loreta Ave?  When can patient go back to full duty?  Ask all his docs.  Re: blood counts/exertional capacity and anticoagulation.    No CP.  Not SOB.

## 2022-08-11 ENCOUNTER — Encounter: Payer: Self-pay | Admitting: Hematology and Oncology

## 2022-08-11 DIAGNOSIS — J3089 Other allergic rhinitis: Secondary | ICD-10-CM | POA: Diagnosis not present

## 2022-08-11 DIAGNOSIS — J3081 Allergic rhinitis due to animal (cat) (dog) hair and dander: Secondary | ICD-10-CM | POA: Diagnosis not present

## 2022-08-11 DIAGNOSIS — J301 Allergic rhinitis due to pollen: Secondary | ICD-10-CM | POA: Diagnosis not present

## 2022-08-11 NOTE — Assessment & Plan Note (Signed)
Continue anticoagulation for now.

## 2022-08-11 NOTE — Assessment & Plan Note (Addendum)
I am going to ask for input from Dr. Loreta Ave about when this patient needs follow-up.    I would also like input from Dr. Loreta Ave and Dr. Leonides Schanz and Dr. Myrtie Neither about this patient eventually returning to full work.  Fortunately patient has full mentation without any jaundice or abdominal pain.  His abdomen is not distended.  We discussed restarting lactulose if he does not have frequent bowel movements.

## 2022-08-12 ENCOUNTER — Telehealth: Payer: Self-pay

## 2022-08-12 ENCOUNTER — Other Ambulatory Visit: Payer: Self-pay | Admitting: Pharmacy Technician

## 2022-08-12 DIAGNOSIS — J3089 Other allergic rhinitis: Secondary | ICD-10-CM | POA: Diagnosis not present

## 2022-08-12 NOTE — Telephone Encounter (Signed)
Auth Submission: NO AUTH NEEDED Site of care: Site of care: [facility] Payer: [employer] Medication & CPT/J Code(s) submitted: Venofer (Iron Sucrose) J1756 Route of submission (phone, fax, portal):  Phone # Fax # Auth type: Buy/Bill Units/visits requested: 5 Approval from: 08/12/2022 to 12/12/2022

## 2022-08-15 ENCOUNTER — Other Ambulatory Visit: Payer: Self-pay | Admitting: Family Medicine

## 2022-08-15 DIAGNOSIS — D649 Anemia, unspecified: Secondary | ICD-10-CM

## 2022-08-17 ENCOUNTER — Telehealth: Payer: Self-pay | Admitting: *Deleted

## 2022-08-17 DIAGNOSIS — D509 Iron deficiency anemia, unspecified: Secondary | ICD-10-CM

## 2022-08-17 NOTE — Telephone Encounter (Signed)
Nathan Rowland requested to have labs drawn at the 3 week point in his iron infusion. OK per Dr Leonides Schanz. Labs ordered and message to scheduler.

## 2022-08-18 ENCOUNTER — Telehealth: Payer: Self-pay | Admitting: Hematology and Oncology

## 2022-08-19 ENCOUNTER — Ambulatory Visit (INDEPENDENT_AMBULATORY_CARE_PROVIDER_SITE_OTHER): Payer: BC Managed Care – PPO

## 2022-08-19 VITALS — BP 122/73 | HR 86 | Temp 99.0°F | Resp 16 | Ht 72.0 in | Wt 235.2 lb

## 2022-08-19 DIAGNOSIS — D509 Iron deficiency anemia, unspecified: Secondary | ICD-10-CM | POA: Diagnosis not present

## 2022-08-19 DIAGNOSIS — D500 Iron deficiency anemia secondary to blood loss (chronic): Secondary | ICD-10-CM

## 2022-08-19 DIAGNOSIS — J3081 Allergic rhinitis due to animal (cat) (dog) hair and dander: Secondary | ICD-10-CM | POA: Diagnosis not present

## 2022-08-19 DIAGNOSIS — J301 Allergic rhinitis due to pollen: Secondary | ICD-10-CM | POA: Diagnosis not present

## 2022-08-19 DIAGNOSIS — J3089 Other allergic rhinitis: Secondary | ICD-10-CM | POA: Diagnosis not present

## 2022-08-19 MED ORDER — DIPHENHYDRAMINE HCL 25 MG PO CAPS
25.0000 mg | ORAL_CAPSULE | Freq: Once | ORAL | Status: AC
  Administered 2022-08-19: 25 mg via ORAL
  Filled 2022-08-19: qty 1

## 2022-08-19 MED ORDER — ACETAMINOPHEN 325 MG PO TABS
650.0000 mg | ORAL_TABLET | Freq: Once | ORAL | Status: AC
  Administered 2022-08-19: 650 mg via ORAL
  Filled 2022-08-19: qty 2

## 2022-08-19 MED ORDER — SODIUM CHLORIDE 0.9 % IV SOLN
200.0000 mg | Freq: Once | INTRAVENOUS | Status: AC
  Administered 2022-08-19: 200 mg via INTRAVENOUS
  Filled 2022-08-19: qty 10

## 2022-08-19 NOTE — Patient Instructions (Signed)

## 2022-08-19 NOTE — Progress Notes (Signed)
Diagnosis: Acute Anemia  Provider:  Chilton Greathouse MD  Procedure: IV Infusion  IV Type: Peripheral, IV Location: Left wrist  Venofer (Iron Sucrose), Dose: 200 mg  Infusion Start Time: 1526  Infusion Stop Time: 1544  Post Infusion IV Care: Observation period completed and Peripheral IV Discontinued  Discharge: Condition: Good, Destination: Home . AVS Declined  Performed by:  Nat Math, RN

## 2022-08-25 ENCOUNTER — Ambulatory Visit
Admission: RE | Admit: 2022-08-25 | Discharge: 2022-08-25 | Disposition: A | Discharge status: Home / Self Care | Payer: BC Managed Care – PPO | Source: Ambulatory Visit | Attending: Student | Admitting: Student

## 2022-08-25 DIAGNOSIS — I81 Portal vein thrombosis: Secondary | ICD-10-CM
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
## 2022-08-26 ENCOUNTER — Ambulatory Visit (INDEPENDENT_AMBULATORY_CARE_PROVIDER_SITE_OTHER): Payer: BC Managed Care – PPO

## 2022-08-26 ENCOUNTER — Other Ambulatory Visit: Payer: Self-pay | Admitting: Interventional Radiology

## 2022-08-26 VITALS — BP 134/69 | HR 86 | Temp 98.1°F | Resp 16 | Ht 72.0 in | Wt 234.0 lb

## 2022-08-26 DIAGNOSIS — D509 Iron deficiency anemia, unspecified: Secondary | ICD-10-CM

## 2022-08-26 DIAGNOSIS — D500 Iron deficiency anemia secondary to blood loss (chronic): Secondary | ICD-10-CM

## 2022-08-26 DIAGNOSIS — I81 Portal vein thrombosis: Secondary | ICD-10-CM

## 2022-08-26 MED ORDER — ACETAMINOPHEN 325 MG PO TABS: 650.0000 mg | ORAL_TABLET | Freq: Once | ORAL | Status: DC

## 2022-08-26 MED ORDER — SODIUM CHLORIDE 0.9 % IV SOLN
200.0000 mg | Freq: Once | INTRAVENOUS | Status: AC
  Administered 2022-08-26: 200 mg via INTRAVENOUS
  Filled 2022-08-26: qty 10

## 2022-08-26 MED ORDER — DIPHENHYDRAMINE HCL 25 MG PO CAPS: 25.0000 mg | ORAL_CAPSULE | Freq: Once | ORAL | Status: DC

## 2022-08-26 NOTE — Progress Notes (Signed)
Diagnosis: Iron Deficiency Anemia  Provider:  Chilton Greathouse MD  Procedure: IV Infusion  IV Type: Peripheral, IV Location: L Antecubital  Venofer (Iron Sucrose), Dose: 200 mg  Infusion Start Time: 1508  Infusion Stop Time: 1525  Post Infusion IV Care: Observation period completed and Peripheral IV Discontinued  Discharge: Condition: Good, Destination: Home . AVS Provided  Performed by:  Garnette Czech, RN

## 2022-09-03 ENCOUNTER — Inpatient Hospital Stay: Payer: BC Managed Care – PPO | Attending: Hematology and Oncology

## 2022-09-03 ENCOUNTER — Other Ambulatory Visit: Payer: Self-pay

## 2022-09-03 DIAGNOSIS — D500 Iron deficiency anemia secondary to blood loss (chronic): Secondary | ICD-10-CM | POA: Diagnosis not present

## 2022-09-03 DIAGNOSIS — K922 Gastrointestinal hemorrhage, unspecified: Secondary | ICD-10-CM | POA: Insufficient documentation

## 2022-09-03 DIAGNOSIS — D509 Iron deficiency anemia, unspecified: Secondary | ICD-10-CM

## 2022-09-03 LAB — CBC WITH DIFFERENTIAL (CANCER CENTER ONLY)
Abs Immature Granulocytes: 0.03 10*3/uL (ref 0.00–0.07)
Basophils Absolute: 0 10*3/uL (ref 0.0–0.1)
Basophils Relative: 1 %
Eosinophils Absolute: 0 10*3/uL (ref 0.0–0.5)
Eosinophils Relative: 0 %
HCT: 39.8 % (ref 39.0–52.0)
Hemoglobin: 12.4 g/dL — ABNORMAL LOW (ref 13.0–17.0)
Immature Granulocytes: 0 %
Lymphocytes Relative: 19 %
Lymphs Abs: 1.6 10*3/uL (ref 0.7–4.0)
MCH: 25.7 pg — ABNORMAL LOW (ref 26.0–34.0)
MCHC: 31.2 g/dL (ref 30.0–36.0)
MCV: 82.4 fL (ref 80.0–100.0)
Monocytes Absolute: 0.6 10*3/uL (ref 0.1–1.0)
Monocytes Relative: 7 %
Neutro Abs: 6.2 10*3/uL (ref 1.7–7.7)
Neutrophils Relative %: 73 %
Platelet Count: 250 10*3/uL (ref 150–400)
RBC: 4.83 MIL/uL (ref 4.22–5.81)
RDW: 20 % — ABNORMAL HIGH (ref 11.5–15.5)
WBC Count: 8.6 10*3/uL (ref 4.0–10.5)
nRBC: 0 % (ref 0.0–0.2)

## 2022-09-06 ENCOUNTER — Telehealth: Payer: Self-pay | Admitting: *Deleted

## 2022-09-06 ENCOUNTER — Ambulatory Visit: Payer: BC Managed Care – PPO

## 2022-09-06 VITALS — BP 129/72 | HR 93 | Temp 98.2°F | Resp 18 | Ht 72.0 in | Wt 242.0 lb

## 2022-09-06 DIAGNOSIS — D509 Iron deficiency anemia, unspecified: Secondary | ICD-10-CM

## 2022-09-06 DIAGNOSIS — J3081 Allergic rhinitis due to animal (cat) (dog) hair and dander: Secondary | ICD-10-CM | POA: Diagnosis not present

## 2022-09-06 DIAGNOSIS — J3089 Other allergic rhinitis: Secondary | ICD-10-CM | POA: Diagnosis not present

## 2022-09-06 DIAGNOSIS — J301 Allergic rhinitis due to pollen: Secondary | ICD-10-CM | POA: Diagnosis not present

## 2022-09-06 DIAGNOSIS — D500 Iron deficiency anemia secondary to blood loss (chronic): Secondary | ICD-10-CM

## 2022-09-06 DIAGNOSIS — I81 Portal vein thrombosis: Secondary | ICD-10-CM

## 2022-09-06 MED ORDER — DIPHENHYDRAMINE HCL 25 MG PO CAPS: 25.0000 mg | ORAL_CAPSULE | Freq: Once | ORAL | Status: DC

## 2022-09-06 MED ORDER — SODIUM CHLORIDE 0.9 % IV SOLN
200.0000 mg | Freq: Once | INTRAVENOUS | Status: AC
  Administered 2022-09-06: 200 mg via INTRAVENOUS
  Filled 2022-09-06: qty 10

## 2022-09-06 MED ORDER — ACETAMINOPHEN 325 MG PO TABS: 650.0000 mg | ORAL_TABLET | Freq: Once | ORAL | Status: DC

## 2022-09-06 NOTE — Telephone Encounter (Signed)
-----   Message from Jaci Standard, MD sent at 09/05/2022 10:12 AM EDT ----- Please let Mr. Gladwell know his Hgb number is rising nicely, currently up to 12.4.   ----- Message ----- From: Leory Plowman, Lab In [place] Sent: 09/03/2022   2:22 PM EDT To: Jaci Standard, MD

## 2022-09-06 NOTE — Telephone Encounter (Signed)
TCT patient regarding recent lab results. Spoke with him and advised that his Hgb number is rising nicely, currently up to 12.4. Nathan Rowland Pt states he is feeling much better. Advised to finish the rest of his iron infusions and we will recheck his labs on 09/30/22 Pt voiced understanding.

## 2022-09-06 NOTE — Progress Notes (Signed)
Diagnosis: Iron Deficiency Anemia  Provider:  Chilton Greathouse MD  Procedure: IV Infusion  IV Type: Peripheral, IV Location: L Antecubital  Venofer (Iron Sucrose), Dose: 200 mg  Infusion Start Time: 1445  Infusion Stop Time: 1503  Post Infusion IV Care: Patient declined observation and Peripheral IV Discontinued  Discharge: Condition: Stable, Destination: Home . AVS Declined  Performed by:  Wyvonne Lenz, RN

## 2022-09-15 ENCOUNTER — Ambulatory Visit (INDEPENDENT_AMBULATORY_CARE_PROVIDER_SITE_OTHER): Payer: BC Managed Care – PPO

## 2022-09-15 VITALS — BP 120/69 | HR 84 | Temp 97.9°F | Resp 18 | Ht 72.0 in | Wt 237.4 lb

## 2022-09-15 DIAGNOSIS — J3089 Other allergic rhinitis: Secondary | ICD-10-CM | POA: Diagnosis not present

## 2022-09-15 DIAGNOSIS — D500 Iron deficiency anemia secondary to blood loss (chronic): Secondary | ICD-10-CM

## 2022-09-15 DIAGNOSIS — J3081 Allergic rhinitis due to animal (cat) (dog) hair and dander: Secondary | ICD-10-CM | POA: Diagnosis not present

## 2022-09-15 DIAGNOSIS — D509 Iron deficiency anemia, unspecified: Secondary | ICD-10-CM | POA: Diagnosis not present

## 2022-09-15 DIAGNOSIS — J301 Allergic rhinitis due to pollen: Secondary | ICD-10-CM | POA: Diagnosis not present

## 2022-09-15 MED ORDER — DIPHENHYDRAMINE HCL 25 MG PO CAPS: 25.0000 mg | ORAL_CAPSULE | Freq: Once | ORAL | Status: DC

## 2022-09-15 MED ORDER — SODIUM CHLORIDE 0.9 % IV SOLN
200.0000 mg | Freq: Once | INTRAVENOUS | Status: AC
  Administered 2022-09-15: 200 mg via INTRAVENOUS
  Filled 2022-09-15: qty 10

## 2022-09-15 MED ORDER — ACETAMINOPHEN 325 MG PO TABS: 650.0000 mg | ORAL_TABLET | Freq: Once | ORAL | Status: DC

## 2022-09-15 NOTE — Progress Notes (Signed)
Diagnosis: Iron Deficiency Anemia  Provider:  Chilton Greathouse MD  Procedure: IV Infusion  IV Type: Peripheral, IV Location: L Antecubital  Venofer (Iron Sucrose), Dose: 200 mg  Infusion Start Time: 1503  Infusion Stop Time: 1519  Post Infusion IV Care: Patient declined observation and Peripheral IV Discontinued  Discharge: Condition: Good, Destination: Home . AVS Declined  Performed by:  Marlow Baars [place], RN

## 2022-09-19 ENCOUNTER — Telehealth: Payer: Self-pay | Admitting: Family Medicine

## 2022-09-19 NOTE — Telephone Encounter (Signed)
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  This patient's exertional capacity has clearly improved in the meantime and he is feeling well.  He is asking about going back to full duty.  If you are okay with this, then please let me know.  Based on his exertional capacity at this point, I think it makes sense for him to return to full duty status.  Thanks.

## 2022-09-20 NOTE — Telephone Encounter (Signed)
See below.  Please update patient.  Agree with return to full duty at work.  Let me know if he needs paperwork for that.  Thanks.  ========================   Jaci Standard, MD  You47 minutes ago (8:10 AM)    Thank you for reaching out. From a Hematology perspective he should be OK to return to work at full duty.

## 2022-09-20 NOTE — Telephone Encounter (Signed)
Called patient he will need note to release to go back to work. He would like letter sent in my chart so he can print off.

## 2022-09-21 DIAGNOSIS — J3081 Allergic rhinitis due to animal (cat) (dog) hair and dander: Secondary | ICD-10-CM | POA: Diagnosis not present

## 2022-09-21 DIAGNOSIS — J301 Allergic rhinitis due to pollen: Secondary | ICD-10-CM | POA: Diagnosis not present

## 2022-09-21 DIAGNOSIS — J3089 Other allergic rhinitis: Secondary | ICD-10-CM | POA: Diagnosis not present

## 2022-09-21 NOTE — Telephone Encounter (Signed)
Patient notified letter was done.

## 2022-09-21 NOTE — Telephone Encounter (Signed)
Letter done.  Thanks. Please notify pt.

## 2022-09-24 ENCOUNTER — Telehealth: Payer: Self-pay

## 2022-09-24 NOTE — Telephone Encounter (Signed)
Patient reviewed message and he is aware that appt is cancelled.

## 2022-09-24 NOTE — Telephone Encounter (Signed)
-----   Message from Charlie Pitter III sent at 09/23/2022  1:00 PM EDT ----- Regarding: Cancel clinic appointment [facility] (or covering nurse)  This patient has an office visit with me on 10/27/2022.  This was apparently set up and follow-up from a hospitalization.  He has noncirrhotic portal hypertension, and I was coordinating his outpatient and inpatient care with interventional radiology and hematology. He has had a complex interventional radiology procedure to address that issue and is under the care of both IR clinic and hematology for management of his anticoagulation.  He does not need any further GI specific management at this point and does not have underlying liver disease, so I do not think he needs that clinic appointment.  Please send him a portal message to let him know we will cancel that appointment.  HD

## 2022-09-24 NOTE — Telephone Encounter (Signed)
MyChart message sent to patient informing him that we have cancelled August appointment.

## 2022-09-27 ENCOUNTER — Ambulatory Visit: Payer: BC Managed Care – PPO | Admitting: *Deleted

## 2022-09-27 VITALS — BP 99/64 | HR 83 | Temp 97.9°F | Resp 16 | Ht 72.0 in | Wt 246.6 lb

## 2022-09-27 DIAGNOSIS — J301 Allergic rhinitis due to pollen: Secondary | ICD-10-CM | POA: Diagnosis not present

## 2022-09-27 DIAGNOSIS — J3081 Allergic rhinitis due to animal (cat) (dog) hair and dander: Secondary | ICD-10-CM | POA: Diagnosis not present

## 2022-09-27 DIAGNOSIS — J3089 Other allergic rhinitis: Secondary | ICD-10-CM | POA: Diagnosis not present

## 2022-09-27 DIAGNOSIS — D500 Iron deficiency anemia secondary to blood loss (chronic): Secondary | ICD-10-CM

## 2022-09-27 DIAGNOSIS — D509 Iron deficiency anemia, unspecified: Secondary | ICD-10-CM | POA: Diagnosis not present

## 2022-09-27 MED ORDER — DIPHENHYDRAMINE HCL 25 MG PO CAPS: 25.0000 mg | ORAL_CAPSULE | Freq: Once | ORAL | Status: DC

## 2022-09-27 MED ORDER — ACETAMINOPHEN 325 MG PO TABS: 650.0000 mg | ORAL_TABLET | Freq: Once | ORAL | Status: DC

## 2022-09-27 MED ORDER — SODIUM CHLORIDE 0.9 % IV SOLN
200.0000 mg | Freq: Once | INTRAVENOUS | Status: AC
  Administered 2022-09-27: 200 mg via INTRAVENOUS
  Filled 2022-09-27: qty 10

## 2022-09-27 NOTE — Progress Notes (Signed)
Diagnosis: Iron Deficiency Anemia  Provider:  Chilton Greathouse MD  Procedure: IV Infusion  IV Type: Peripheral, IV Location: L Hand  Venofer (Iron Sucrose), Dose: 200 mg  Infusion Start Time: 1440 pm  Infusion Stop Time: 1500 pm  Post Infusion IV Care: Observation period completed and Peripheral IV Discontinued  Discharge: Condition: Good, Destination: Home . AVS Declined  Performed by:  Forrest Moron, RN

## 2022-09-29 ENCOUNTER — Other Ambulatory Visit: Payer: Self-pay | Admitting: Hematology and Oncology

## 2022-09-29 DIAGNOSIS — D509 Iron deficiency anemia, unspecified: Secondary | ICD-10-CM

## 2022-09-30 ENCOUNTER — Inpatient Hospital Stay: Payer: BC Managed Care – PPO | Attending: Hematology and Oncology

## 2022-09-30 ENCOUNTER — Other Ambulatory Visit: Payer: Self-pay

## 2022-09-30 DIAGNOSIS — Z7901 Long term (current) use of anticoagulants: Secondary | ICD-10-CM | POA: Insufficient documentation

## 2022-09-30 DIAGNOSIS — K922 Gastrointestinal hemorrhage, unspecified: Secondary | ICD-10-CM | POA: Insufficient documentation

## 2022-09-30 DIAGNOSIS — I81 Portal vein thrombosis: Secondary | ICD-10-CM | POA: Diagnosis not present

## 2022-09-30 DIAGNOSIS — D500 Iron deficiency anemia secondary to blood loss (chronic): Secondary | ICD-10-CM | POA: Diagnosis not present

## 2022-09-30 DIAGNOSIS — D509 Iron deficiency anemia, unspecified: Secondary | ICD-10-CM

## 2022-09-30 LAB — CMP (CANCER CENTER ONLY)
ALT: 22 U/L (ref 0–44)
AST: 20 U/L (ref 15–41)
Albumin: 4.1 g/dL (ref 3.5–5.0)
Alkaline Phosphatase: 66 U/L (ref 38–126)
Anion gap: 6 (ref 5–15)
BUN: 11 mg/dL (ref 6–20)
CO2: 25 mmol/L (ref 22–32)
Calcium: 9.1 mg/dL (ref 8.9–10.3)
Chloride: 110 mmol/L (ref 98–111)
Creatinine: 0.92 mg/dL (ref 0.61–1.24)
GFR, Estimated: >60 mL/min (ref >60)
Glucose, Bld: 117 mg/dL — ABNORMAL HIGH (ref 70–99)
Potassium: 3.8 mmol/L (ref 3.5–5.1)
Sodium: 141 mmol/L (ref 135–145)
Total Bilirubin: 0.5 mg/dL (ref 0.3–1.2)
Total Protein: 6.7 g/dL (ref 6.5–8.1)

## 2022-09-30 LAB — CBC WITH DIFFERENTIAL (CANCER CENTER ONLY)
Abs Immature Granulocytes: 0.01 10*3/uL (ref 0.00–0.07)
Basophils Absolute: 0 10*3/uL (ref 0.0–0.1)
Basophils Relative: 1 %
Eosinophils Absolute: 0.1 10*3/uL (ref 0.0–0.5)
Eosinophils Relative: 1 %
HCT: 41.5 % (ref 39.0–52.0)
Hemoglobin: 13.8 g/dL (ref 13.0–17.0)
Immature Granulocytes: 0 %
Lymphocytes Relative: 23 %
Lymphs Abs: 1.3 10*3/uL (ref 0.7–4.0)
MCH: 27.1 pg (ref 26.0–34.0)
MCHC: 33.3 g/dL (ref 30.0–36.0)
MCV: 81.4 fL (ref 80.0–100.0)
Monocytes Absolute: 0.6 10*3/uL (ref 0.1–1.0)
Monocytes Relative: 10 %
Neutro Abs: 3.6 10*3/uL (ref 1.7–7.7)
Neutrophils Relative %: 65 %
Platelet Count: 187 10*3/uL (ref 150–400)
RBC: 5.1 MIL/uL (ref 4.22–5.81)
RDW: 19.9 % — ABNORMAL HIGH (ref 11.5–15.5)
WBC Count: 5.5 10*3/uL (ref 4.0–10.5)
nRBC: 0 % (ref 0.0–0.2)

## 2022-09-30 LAB — IRON AND IRON BINDING CAPACITY (CC-WL,HP ONLY)
Iron: 59 ug/dL (ref 45–182)
Saturation Ratios: 20 % (ref 17.9–39.5)
TIBC: 297 ug/dL (ref 250–450)
UIBC: 238 ug/dL (ref 117–376)

## 2022-09-30 LAB — FERRITIN: Ferritin: 191 ng/mL (ref 24–336)

## 2022-09-30 LAB — RETIC PANEL
Immature Retic Fract: 6.8 % (ref 2.3–15.9)
RBC.: 5.18 MIL/uL (ref 4.22–5.81)
Retic Count, Absolute: 37.8 10*3/uL (ref 19.0–186.0)
Retic Ct Pct: 0.7 % (ref 0.4–3.1)
Reticulocyte Hemoglobin: 33.2 pg (ref >27.9)

## 2022-10-05 DIAGNOSIS — J301 Allergic rhinitis due to pollen: Secondary | ICD-10-CM | POA: Diagnosis not present

## 2022-10-05 DIAGNOSIS — J3081 Allergic rhinitis due to animal (cat) (dog) hair and dander: Secondary | ICD-10-CM | POA: Diagnosis not present

## 2022-10-05 DIAGNOSIS — J3089 Other allergic rhinitis: Secondary | ICD-10-CM | POA: Diagnosis not present

## 2022-10-07 ENCOUNTER — Telehealth: Payer: Self-pay | Admitting: *Deleted

## 2022-10-07 NOTE — Telephone Encounter (Signed)
TCT patient regarding recent lab results  Spoke with pt and advised that his blood counts look good. His Hgb is now normal >13 and his iron levels normalized. We will see him back in Sept to reassess. Pt voiced understanding. He is aware of his appt in September 2024

## 2022-10-07 NOTE — Telephone Encounter (Signed)
-----   Message from Ulysees Barns IV sent at 10/07/2022  8:51 AM EDT ----- Please let Mr. Mordan know his blood counts look good. His Hgb is now normal >13 and his iron levels normalized. We will see him back in Sept to reassess. ----- Message ----- From: Leory Plowman, Lab In [place] Sent: 09/30/2022  10:01 AM EDT To: Jaci Standard, MD

## 2022-10-14 DIAGNOSIS — J301 Allergic rhinitis due to pollen: Secondary | ICD-10-CM | POA: Diagnosis not present

## 2022-10-14 DIAGNOSIS — J3081 Allergic rhinitis due to animal (cat) (dog) hair and dander: Secondary | ICD-10-CM | POA: Diagnosis not present

## 2022-10-14 DIAGNOSIS — J3089 Other allergic rhinitis: Secondary | ICD-10-CM | POA: Diagnosis not present

## 2022-10-27 ENCOUNTER — Ambulatory Visit: Payer: BC Managed Care – PPO | Admitting: Gastroenterology

## 2022-10-27 DIAGNOSIS — J3089 Other allergic rhinitis: Secondary | ICD-10-CM | POA: Diagnosis not present

## 2022-10-27 DIAGNOSIS — J301 Allergic rhinitis due to pollen: Secondary | ICD-10-CM | POA: Diagnosis not present

## 2022-10-27 DIAGNOSIS — J3081 Allergic rhinitis due to animal (cat) (dog) hair and dander: Secondary | ICD-10-CM | POA: Diagnosis not present

## 2022-11-04 DIAGNOSIS — J3089 Other allergic rhinitis: Secondary | ICD-10-CM | POA: Diagnosis not present

## 2022-11-04 DIAGNOSIS — J301 Allergic rhinitis due to pollen: Secondary | ICD-10-CM | POA: Diagnosis not present

## 2022-11-04 DIAGNOSIS — J3081 Allergic rhinitis due to animal (cat) (dog) hair and dander: Secondary | ICD-10-CM | POA: Diagnosis not present

## 2022-11-10 DIAGNOSIS — J3081 Allergic rhinitis due to animal (cat) (dog) hair and dander: Secondary | ICD-10-CM | POA: Diagnosis not present

## 2022-11-10 DIAGNOSIS — J301 Allergic rhinitis due to pollen: Secondary | ICD-10-CM | POA: Diagnosis not present

## 2022-11-10 DIAGNOSIS — J3089 Other allergic rhinitis: Secondary | ICD-10-CM | POA: Diagnosis not present

## 2022-11-16 DIAGNOSIS — J301 Allergic rhinitis due to pollen: Secondary | ICD-10-CM | POA: Diagnosis not present

## 2022-11-16 DIAGNOSIS — J3081 Allergic rhinitis due to animal (cat) (dog) hair and dander: Secondary | ICD-10-CM | POA: Diagnosis not present

## 2022-11-16 DIAGNOSIS — J3089 Other allergic rhinitis: Secondary | ICD-10-CM | POA: Diagnosis not present

## 2022-11-24 ENCOUNTER — Other Ambulatory Visit: Payer: Self-pay

## 2022-11-24 ENCOUNTER — Other Ambulatory Visit: Payer: Self-pay | Admitting: Hematology and Oncology

## 2022-11-24 ENCOUNTER — Inpatient Hospital Stay: Payer: BC Managed Care – PPO | Attending: Hematology and Oncology | Admitting: Physician Assistant

## 2022-11-24 ENCOUNTER — Inpatient Hospital Stay: Payer: BC Managed Care – PPO

## 2022-11-24 VITALS — BP 130/65 | HR 79 | Temp 98.1°F | Resp 16 | Wt 242.4 lb

## 2022-11-24 DIAGNOSIS — Z79899 Other long term (current) drug therapy: Secondary | ICD-10-CM | POA: Diagnosis not present

## 2022-11-24 DIAGNOSIS — K922 Gastrointestinal hemorrhage, unspecified: Secondary | ICD-10-CM | POA: Diagnosis not present

## 2022-11-24 DIAGNOSIS — D500 Iron deficiency anemia secondary to blood loss (chronic): Secondary | ICD-10-CM | POA: Diagnosis not present

## 2022-11-24 DIAGNOSIS — D509 Iron deficiency anemia, unspecified: Secondary | ICD-10-CM

## 2022-11-24 DIAGNOSIS — I2699 Other pulmonary embolism without acute cor pulmonale: Secondary | ICD-10-CM | POA: Insufficient documentation

## 2022-11-24 DIAGNOSIS — I81 Portal vein thrombosis: Secondary | ICD-10-CM | POA: Insufficient documentation

## 2022-11-24 DIAGNOSIS — J301 Allergic rhinitis due to pollen: Secondary | ICD-10-CM | POA: Diagnosis not present

## 2022-11-24 DIAGNOSIS — J3081 Allergic rhinitis due to animal (cat) (dog) hair and dander: Secondary | ICD-10-CM | POA: Diagnosis not present

## 2022-11-24 DIAGNOSIS — J3089 Other allergic rhinitis: Secondary | ICD-10-CM | POA: Diagnosis not present

## 2022-11-24 LAB — CMP (CANCER CENTER ONLY)
ALT: 21 U/L (ref 0–44)
AST: 17 U/L (ref 15–41)
Albumin: 4.3 g/dL (ref 3.5–5.0)
Alkaline Phosphatase: 71 U/L (ref 38–126)
Anion gap: 6 (ref 5–15)
BUN: 11 mg/dL (ref 6–20)
CO2: 26 mmol/L (ref 22–32)
Calcium: 9.2 mg/dL (ref 8.9–10.3)
Chloride: 108 mmol/L (ref 98–111)
Creatinine: 0.94 mg/dL (ref 0.61–1.24)
GFR, Estimated: >60 mL/min (ref >60)
Glucose, Bld: 90 mg/dL (ref 70–99)
Potassium: 3.9 mmol/L (ref 3.5–5.1)
Sodium: 140 mmol/L (ref 135–145)
Total Bilirubin: 0.9 mg/dL (ref 0.3–1.2)
Total Protein: 7.2 g/dL (ref 6.5–8.1)

## 2022-11-24 LAB — CBC WITH DIFFERENTIAL (CANCER CENTER ONLY)
Abs Immature Granulocytes: 0.02 10*3/uL (ref 0.00–0.07)
Basophils Absolute: 0 10*3/uL (ref 0.0–0.1)
Basophils Relative: 1 %
Eosinophils Absolute: 0 10*3/uL (ref 0.0–0.5)
Eosinophils Relative: 0 %
HCT: 44.7 % (ref 39.0–52.0)
Hemoglobin: 15.4 g/dL (ref 13.0–17.0)
Immature Granulocytes: 0 %
Lymphocytes Relative: 24 %
Lymphs Abs: 1.8 10*3/uL (ref 0.7–4.0)
MCH: 28.5 pg (ref 26.0–34.0)
MCHC: 34.5 g/dL (ref 30.0–36.0)
MCV: 82.8 fL (ref 80.0–100.0)
Monocytes Absolute: 0.6 10*3/uL (ref 0.1–1.0)
Monocytes Relative: 8 %
Neutro Abs: 5 10*3/uL (ref 1.7–7.7)
Neutrophils Relative %: 67 %
Platelet Count: 191 10*3/uL (ref 150–400)
RBC: 5.4 MIL/uL (ref 4.22–5.81)
RDW: 16.6 % — ABNORMAL HIGH (ref 11.5–15.5)
WBC Count: 7.5 10*3/uL (ref 4.0–10.5)
nRBC: 0 % (ref 0.0–0.2)

## 2022-11-24 LAB — RETIC PANEL
Immature Retic Fract: 5.7 % (ref 2.3–15.9)
RBC.: 5.33 MIL/uL (ref 4.22–5.81)
Retic Count, Absolute: 62.9 10*3/uL (ref 19.0–186.0)
Retic Ct Pct: 1.2 % (ref 0.4–3.1)
Reticulocyte Hemoglobin: 33.5 pg (ref >27.9)

## 2022-11-24 LAB — IRON AND IRON BINDING CAPACITY (CC-WL,HP ONLY)
Iron: 87 ug/dL (ref 45–182)
Saturation Ratios: 25 % (ref 17.9–39.5)
TIBC: 346 ug/dL (ref 250–450)
UIBC: 259 ug/dL (ref 117–376)
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
## 2022-11-25 ENCOUNTER — Other Ambulatory Visit: Payer: Self-pay | Admitting: Family Medicine

## 2022-11-25 ENCOUNTER — Telehealth: Payer: Self-pay | Admitting: Family Medicine

## 2022-11-25 ENCOUNTER — Encounter: Payer: Self-pay | Admitting: Physician Assistant

## 2022-11-25 LAB — FERRITIN: Ferritin: 62 ng/mL (ref 24–336)

## 2022-11-25 MED ORDER — APIXABAN 5 MG PO TABS: 5.0000 mg | ORAL_TABLET | Freq: Two times a day (BID) | ORAL | 3 refills | Status: DC

## 2022-11-25 NOTE — Telephone Encounter (Signed)
Sent. Thanks.  Let me know if he can't get it filled.

## 2022-11-25 NOTE — Telephone Encounter (Signed)
Prescription Request  11/25/2022  LOV: 08/09/2022  What is the name of the medication or equipment? apixaban (ELIQUIS) 5 MG TABS tablet   Have you contacted your pharmacy to request a refill? Yes   Which pharmacy would you like this sent to?   [id number] [place], [place] - [address] AT [facility] [address] [place] [address] Phone: [phone] Fax: [phone]    Patient notified that their request is being sent to the clinical staff for review and that they should receive a response within 2 business days.   Please advise at Mobile [phone] (mobile)  PT is out of this medication, is having a hard time filling this through pharmacy. Pt asked if Para March is able to fill this for him? Another provider usually manages this med.

## 2022-12-03 DIAGNOSIS — J3089 Other allergic rhinitis: Secondary | ICD-10-CM | POA: Diagnosis not present

## 2022-12-03 DIAGNOSIS — J301 Allergic rhinitis due to pollen: Secondary | ICD-10-CM | POA: Diagnosis not present

## 2022-12-03 DIAGNOSIS — J3081 Allergic rhinitis due to animal (cat) (dog) hair and dander: Secondary | ICD-10-CM | POA: Diagnosis not present

## 2022-12-08 DIAGNOSIS — J3089 Other allergic rhinitis: Secondary | ICD-10-CM | POA: Diagnosis not present

## 2022-12-08 DIAGNOSIS — J3081 Allergic rhinitis due to animal (cat) (dog) hair and dander: Secondary | ICD-10-CM | POA: Diagnosis not present

## 2022-12-08 DIAGNOSIS — J301 Allergic rhinitis due to pollen: Secondary | ICD-10-CM | POA: Diagnosis not present

## 2022-12-14 DIAGNOSIS — J301 Allergic rhinitis due to pollen: Secondary | ICD-10-CM | POA: Diagnosis not present

## 2022-12-14 DIAGNOSIS — J3081 Allergic rhinitis due to animal (cat) (dog) hair and dander: Secondary | ICD-10-CM | POA: Diagnosis not present

## 2022-12-14 DIAGNOSIS — J3089 Other allergic rhinitis: Secondary | ICD-10-CM | POA: Diagnosis not present

## 2022-12-31 DIAGNOSIS — J3089 Other allergic rhinitis: Secondary | ICD-10-CM | POA: Diagnosis not present

## 2022-12-31 DIAGNOSIS — J3081 Allergic rhinitis due to animal (cat) (dog) hair and dander: Secondary | ICD-10-CM | POA: Diagnosis not present

## 2022-12-31 DIAGNOSIS — J301 Allergic rhinitis due to pollen: Secondary | ICD-10-CM | POA: Diagnosis not present

## 2023-01-10 DIAGNOSIS — J3081 Allergic rhinitis due to animal (cat) (dog) hair and dander: Secondary | ICD-10-CM | POA: Diagnosis not present

## 2023-01-10 DIAGNOSIS — J301 Allergic rhinitis due to pollen: Secondary | ICD-10-CM | POA: Diagnosis not present

## 2023-01-10 DIAGNOSIS — J3089 Other allergic rhinitis: Secondary | ICD-10-CM | POA: Diagnosis not present

## 2023-02-02 DIAGNOSIS — J3089 Other allergic rhinitis: Secondary | ICD-10-CM | POA: Diagnosis not present

## 2023-02-02 DIAGNOSIS — J3081 Allergic rhinitis due to animal (cat) (dog) hair and dander: Secondary | ICD-10-CM | POA: Diagnosis not present

## 2023-02-02 DIAGNOSIS — J301 Allergic rhinitis due to pollen: Secondary | ICD-10-CM | POA: Diagnosis not present

## 2023-02-08 DIAGNOSIS — J301 Allergic rhinitis due to pollen: Secondary | ICD-10-CM | POA: Diagnosis not present

## 2023-02-08 DIAGNOSIS — J3089 Other allergic rhinitis: Secondary | ICD-10-CM | POA: Diagnosis not present

## 2023-02-08 DIAGNOSIS — J3081 Allergic rhinitis due to animal (cat) (dog) hair and dander: Secondary | ICD-10-CM | POA: Diagnosis not present

## 2023-02-18 DIAGNOSIS — J3089 Other allergic rhinitis: Secondary | ICD-10-CM | POA: Diagnosis not present

## 2023-02-18 DIAGNOSIS — J3081 Allergic rhinitis due to animal (cat) (dog) hair and dander: Secondary | ICD-10-CM | POA: Diagnosis not present

## 2023-02-18 DIAGNOSIS — J301 Allergic rhinitis due to pollen: Secondary | ICD-10-CM | POA: Diagnosis not present

## 2023-02-28 ENCOUNTER — Ambulatory Visit: Payer: Self-pay | Admitting: Family Medicine

## 2023-02-28 NOTE — Telephone Encounter (Signed)
Copied from CRM [id number]. Topic: Clinical - Pink Word Triage >> Feb 28, 2023  2:07 PM Geneva B wrote: Reason for Triage: pt is calling in for lower back pain.   Chief Complaint:  Low back Pain  Symptoms:  Moderate Pain in his lower back that worsens with sitting  or movement. Frequency:   Pain has increased since Halloween is when the pain started  Pertinent Negatives: Patient denies any injury. Disposition: [] ED /[] Urgent Care (no appt availability in office) / [x] Appointment(In office/virtual)/ []  [place] [facility]/ [] Home Care/ [] Refused Recommended Disposition /[] [place] [facility]/ []  Follow-up with PCP Additional Notes:  Patient was moved in to a joint pain appointment. Canceled later appointment . Reason for Disposition  [1] Numbness in an arm or hand (i.e., loss of sensation) AND [2] upper back pain  [1] MODERATE back pain (e.g., interferes with normal activities) AND [2] present > 3 days  Answer Assessment - Initial Assessment Questions 1. ONSET: "When did the pain begin?"          Low back Pain since  the 20 minimal The pain  started to get worse  October  31. 2. LOCATION: "Where does it hurt?" (upper, mid or lower back)      Lower back 3. SEVERITY: "How bad is the pain?"  (e.g., Scale 1-10; mild, moderate, or severe) Patient rates pain as 2-3 . If he tries to bend over or sitting down then the pain  is 6-7 on pain scale.      - MODERATE (4-7): Interferes with normal activities or awakens from sleep.    4. PATTERN: "Is the pain constant?" (e.g., yes, no; constant, intermittent)      If he is laying down or standing up it can go away. If he is sitting at all it is a little pain. If he flex primarily on the left then the pain will increase. 5. RADIATION: "Does the pain shoot into your legs or somewhere else?"       Pain radiates the back of the left leg 6. CAUSE:  "What do you think is causing the back pain?"       Sciatica- Patient thinks it feel very similar except it  very intense and hasn't stopped since  Halloween 7. BACK OVERUSE:  "Any recent lifting of heavy objects, strenuous work or exercise?"     "Oh, Yes" Anytime he tried to pick up or bend over it hurts 8. MEDICINES: "What have you taken so far for the pain?" (e.g., nothing, acetaminophen, NSAIDS)      Advil- takes the edge off but doesn't stop the pain. 9. NEUROLOGIC SYMPTOMS: "Do you have any weakness, numbness, or problems with bowel/bladder control?"      Denies. 10. OTHER SYMPTOMS: "Do you have any other symptoms?" (e.g., fever, abdomen pain, burning with urination, blood in urine)        Denies.  Protocols used: Back Pain-A-AH

## 2023-03-02 NOTE — Progress Notes (Signed)
 Jalin Alicea T. Lamonta Cypress, MD, CAQ Sports Medicine [facility] at [facility] [address] [place], [address]  Phone: [phone]  FAX: [phone]  Nathan Rowland - 43 y.o. male  MRN [id number]  Date of Birth: Jan 29, 1981  Date: 03/03/2023  PCP: Cleatus Arlyss RAMAN, MD  Referral: Cleatus Arlyss RAMAN, MD  Chief Complaint  Patient presents with   Back Pain    Low   Subjective:   Nathan Nathan is a 43 y.o. very pleasant male patient with Body mass index is 33.52 kg/m. who presents with the following:  The patient presents with ongoing low back pain.  He is a pleasant gentleman who has had problems with his back off and on for about 20 years.  Right now he is having pain in the lower back and the posterior buttocks.  He has had intermittent radiculopathy off and on over the years.  Right now he does have some left-sided radiculopathy, but has been worse in other occasions.  Over the last few months, his back pain has continued to escalate.  He is now standing and in some discomfort during his history and exam.  New Years eve was really bad.  Some L radiculopathy to the knee, but not as bad right now.  He does not have any numbness or focal weakness.  Also with R sided radiculopathy of the R UE.  Jury duty on Tuesday.   Review of Systems is noted in the HPI, as appropriate  Objective:   BP 110/76 (BP Location: Left Arm, Patient Position: Sitting, Cuff Size: Large)   Pulse 88   Temp 98.4 F (36.9 C) (Temporal)   Ht 6' (1.829 m)   Wt 247 lb 2 oz (112.1 kg)   SpO2 97%   BMI 33.52 kg/m   GEN: No acute distress; alert,appropriate. PULM: Breathing comfortably in no respiratory distress PSYCH: Normally interactive.    Range of motion at  the waist: Flexion, extension, lateral bending and rotation: He is currently limited to about 65 degrees of forward flexion.  Extension also does cause some pain, however lateral bending and rotational maneuvers are  more preserved.  No echymosis or edema Rises to examination table with mild difficulty Gait: minimally antalgic  Inspection/Deformity: N Paraspinus Tenderness: L3-S1, left greater than right  B Ankle Dorsiflexion (L5,4): 5/5 B Great Toe Dorsiflexion (L5,4): 5/5 Heel Walk (L5): WNL Toe Walk (S1): WNL Rise/Squat (L4): WNL, mild pain  SENSORY B Medial Foot (L4): WNL B Dorsum (L5): WNL B Lateral (S1): WNL Light Touch: WNL Pinprick: WNL  REFLEXES Knee (L4): 2+ Ankle (S1): 2+  B SLR, seated: neg B SLR, supine: back pain B FABER: neg B Reverse FABER: neg B Greater Troch: NT B Log Roll: neg B Sciatic Notch: NT   Laboratory and Imaging Data:  Assessment and Plan:     ICD-10-CM   1. Chronic back pain greater than 3 months duration  M54.9 DG Lumbar Spine Complete   G89.29 Ambulatory referral to Physical Therapy     Reassuring low back x-ray with no acute findings.  Minimal degenerative changes.  Chronic back pain in the setting of 20 years with intermittent exacerbations that have worsened.  Recommended weight loss, increase fitness, and I will also refer the patient to formal physical therapy.  I did give him some home rehab that he can work on now.  I will also give him a pulse of some steroids and some muscle relaxers that he can try  at nighttime.  Medication Management during today's office visit: Meds ordered this encounter  Medications   predniSONE  (DELTASONE ) 20 MG tablet    Sig: 2 tabs po daily for 5 days, then 1 tab po daily for 5 days    Dispense:  15 tablet    Refill:  0   tiZANidine  (ZANAFLEX ) 4 MG tablet    Sig: Take 1 tablet (4 mg total) by mouth at bedtime as needed for muscle spasms.    Dispense:  30 tablet    Refill:  2   Medications Discontinued During This Encounter  Medication Reason   Iron , Ferrous Sulfate , 325 (65 Fe) MG TABS Completed Course   lactulose  (CHRONULAC ) 10 GM/15ML solution No longer needed (for PRN medications)    Orders  placed today for conditions managed today: Orders Placed This Encounter  Procedures   DG Lumbar Spine Complete   Ambulatory referral to Physical Therapy    Disposition: 2 months  Dragon [employer] One speech-to-text software was used for transcription in this dictation.  Possible transcriptional errors can occur using Animal nutritionist.   Signed,  Jacques DASEN. Abbeygail Igoe, MD   Outpatient Encounter Medications as of 03/03/2023  Medication Sig   apixaban  (ELIQUIS ) 5 MG TABS tablet Take 1 tablet (5 mg total) by mouth 2 (two) times daily.   EPINEPHrine  0.3 mg/0.3 mL IJ SOAJ injection Inject 0.3 mg into the muscle once as needed for anaphylaxis.   predniSONE  (DELTASONE ) 20 MG tablet 2 tabs po daily for 5 days, then 1 tab po daily for 5 days   tiZANidine  (ZANAFLEX ) 4 MG tablet Take 1 tablet (4 mg total) by mouth at bedtime as needed for muscle spasms.   [DISCONTINUED] lactulose  (CHRONULAC ) 10 GM/15ML solution Take 22.5 mLs (15 g total) by mouth 2 (two) times daily as needed for mild constipation.   [DISCONTINUED] Iron , Ferrous Sulfate , 325 (65 Fe) MG TABS Take 325 mg by mouth daily. (Patient not taking: Reported on 11/24/2022)   No facility-administered encounter medications on file as of 03/03/2023.

## 2023-03-03 ENCOUNTER — Ambulatory Visit
Admission: RE | Admit: 2023-03-03 | Discharge: 2023-03-03 | Disposition: A | Discharge status: Home / Self Care | Payer: BC Managed Care – PPO | Source: Ambulatory Visit | Attending: Family Medicine | Admitting: Family Medicine

## 2023-03-03 ENCOUNTER — Encounter: Payer: Self-pay | Admitting: Family Medicine

## 2023-03-03 ENCOUNTER — Other Ambulatory Visit: Payer: Self-pay

## 2023-03-03 ENCOUNTER — Ambulatory Visit (INDEPENDENT_AMBULATORY_CARE_PROVIDER_SITE_OTHER): Payer: BC Managed Care – PPO | Admitting: Family Medicine

## 2023-03-03 VITALS — BP 110/76 | HR 88 | Temp 98.4°F | Ht 72.0 in | Wt 247.1 lb

## 2023-03-03 DIAGNOSIS — G8929 Other chronic pain: Secondary | ICD-10-CM | POA: Diagnosis not present

## 2023-03-03 DIAGNOSIS — M545 Low back pain, unspecified: Secondary | ICD-10-CM | POA: Diagnosis not present

## 2023-03-03 DIAGNOSIS — M549 Dorsalgia, unspecified: Secondary | ICD-10-CM | POA: Diagnosis not present

## 2023-03-03 MED ORDER — PREDNISONE 20 MG PO TABS: ORAL_TABLET | ORAL | 0 refills | Status: DC

## 2023-03-03 MED ORDER — TIZANIDINE HCL 4 MG PO TABS: 4.0000 mg | ORAL_TABLET | Freq: Every evening | ORAL | 2 refills | Status: DC | PRN

## 2023-03-08 ENCOUNTER — Ambulatory Visit: Payer: BC Managed Care – PPO | Admitting: Family Medicine

## 2023-03-11 DIAGNOSIS — J3081 Allergic rhinitis due to animal (cat) (dog) hair and dander: Secondary | ICD-10-CM | POA: Diagnosis not present

## 2023-03-11 DIAGNOSIS — J3089 Other allergic rhinitis: Secondary | ICD-10-CM | POA: Diagnosis not present

## 2023-03-11 DIAGNOSIS — J301 Allergic rhinitis due to pollen: Secondary | ICD-10-CM | POA: Diagnosis not present

## 2023-03-14 ENCOUNTER — Encounter: Payer: Self-pay | Admitting: Family Medicine

## 2023-03-14 DIAGNOSIS — G8929 Other chronic pain: Secondary | ICD-10-CM

## 2023-03-14 DIAGNOSIS — M5126 Other intervertebral disc displacement, lumbar region: Secondary | ICD-10-CM

## 2023-03-14 DIAGNOSIS — M4827 Kissing spine, lumbosacral region: Secondary | ICD-10-CM

## 2023-03-15 ENCOUNTER — Encounter: Payer: Self-pay | Admitting: Physical Therapy

## 2023-03-15 ENCOUNTER — Ambulatory Visit (INDEPENDENT_AMBULATORY_CARE_PROVIDER_SITE_OTHER): Payer: BC Managed Care – PPO | Admitting: Physical Therapy

## 2023-03-15 DIAGNOSIS — M6281 Muscle weakness (generalized): Secondary | ICD-10-CM | POA: Diagnosis not present

## 2023-03-15 DIAGNOSIS — M5416 Radiculopathy, lumbar region: Secondary | ICD-10-CM

## 2023-03-15 DIAGNOSIS — R262 Difficulty in walking, not elsewhere classified: Secondary | ICD-10-CM

## 2023-03-15 DIAGNOSIS — M5459 Other low back pain: Secondary | ICD-10-CM

## 2023-03-15 MED ORDER — TRAMADOL HCL 50 MG PO TABS: 50.0000 mg | ORAL_TABLET | Freq: Three times a day (TID) | ORAL | 0 refills | Status: DC | PRN

## 2023-03-15 NOTE — Therapy (Signed)
 OUTPATIENT PHYSICAL THERAPY THORACOLUMBAR EVALUATION   Patient Name: Nathan Rowland MRN: [id number] DOB:11/23/1980, 43 y.o., male Today's Date: 03/15/2023  END OF SESSION:  PT End of Session - 03/15/23 1440     Visit Number 1    Number of Visits 10    Date for PT Re-Evaluation 05/24/23    PT Start Time 1300    PT Stop Time 1340    PT Time Calculation (min) 40 min    Activity Tolerance Patient tolerated treatment well    Behavior During Therapy [facility] for tasks assessed/performed             Past Medical History:  Diagnosis Date   Allergy 11/29/1996   Anemia    Blood transfusion without reported diagnosis    Clotting disorder (HCC)    Finger fracture    Kidney calculus    Left knee pain    dx'd with L retropatellar knee pain   Past Surgical History:  Procedure Laterality Date   COLONOSCOPY WITH PROPOFOL  N/A 07/14/2022   Procedure: COLONOSCOPY WITH PROPOFOL ;  Surgeon: Legrand Victory LITTIE DOUGLAS, MD;  Location: [facility] ENDOSCOPY;  Service: Gastroenterology;  Laterality: N/A;   ENTEROSCOPY N/A 07/12/2022   Procedure: ENTEROSCOPY;  Surgeon: Legrand Victory LITTIE DOUGLAS, MD;  Location: [facility] ENDOSCOPY;  Service: Gastroenterology;  Laterality: N/A;   GIVENS CAPSULE STUDY N/A 07/12/2022   Procedure: GIVENS CAPSULE STUDY;  Surgeon: Legrand Victory LITTIE DOUGLAS, MD;  Location: [facility] ENDOSCOPY;  Service: Gastroenterology;  Laterality: N/A;   IR EMBO ART  VEN HEMORR LYMPH EXTRAV  INC GUIDE ROADMAPPING  07/21/2022   IR RADIOLOGIST EVAL & MGMT  06/02/2022   IR TIPS  07/21/2022   IR US  GUIDE VASC ACCESS LEFT  07/21/2022   IR US  GUIDE VASC ACCESS RIGHT  07/21/2022   IR US  GUIDE VASC ACCESS RIGHT  07/21/2022   RADIOLOGY WITH ANESTHESIA N/A 07/21/2022   Procedure: TIPS;  Surgeon: Jennefer Ester PARAS, MD;  Location: [facility] OR;  Service: Radiology;  Laterality: N/A;   SUBMUCOSAL TATTOO INJECTION  07/12/2022   Procedure: SUBMUCOSAL TATTOO INJECTION;  Surgeon: Legrand Victory LITTIE DOUGLAS, MD;  Location: [facility] ENDOSCOPY;  Service:  Gastroenterology;;   Patient Active Problem List   Diagnosis Date Noted   History of GI bleed 07/28/2022   Ileus (HCC) 07/28/2022   Pulmonary embolus (HCC) 07/28/2022   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   Chronic anticoagulation 07/28/2022   Abdominal pain 07/27/2022   Current use of long term anticoagulation 07/13/2022   Portal hypertension (HCC) 07/13/2022   Acute upper GI bleed 07/12/2022   Blood loss anemia 07/12/2022   Esophageal varices (HCC) 07/12/2022   Melena 07/12/2022   Acute hypoxic respiratory failure (HCC) 07/12/2022   Iron  deficiency anemia 07/05/2022   Back strain 05/26/2022   Kidney calculus 08/04/2021   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 Pt stating around Halloween 2024 his pain started to get worse.   SUBJECTIVE:  SUBJECTIVE STATEMENT: Pt arriving reporting chronic low back pain. Pt reporting pain in his low back and gluteal area. Pt with history of radiculopathy. Pt stating over the last few days his pain is radiating down his left LE into his calf. Pt stating standing relieves his pain. Pt stating transitions are rough.   PERTINENT HISTORY:  Pt on blood  thinner See history in chart above  PAIN:  NPRS scale: 1/10, worse pain over the last week 7-8/10 Pain location: low back, Left LE Pain description: sharp, achy/dull Aggravating factors: transitions Relieving factors: standing  PRECAUTIONS: None  WEIGHT BEARING RESTRICTIONS: No  FALLS:  Has patient fallen in last 6 months? No  LIVING ENVIRONMENT: Lives with: lives with their family and lives with their spouse Lives in: House/apartment Stairs: yes Has following equipment at home: None  OCCUPATION: Nurse, Mental Health  PLOF: Independent  PATIENT GOALS: Be able to perform sit to stand and toilet without pain  Next MD Visit: no follow up scheduled at present time   OBJECTIVE:   DIAGNOSTIC FINDINGS:  03/03/23 FINDINGS: There is no evidence of lumbar spine fracture. Alignment is normal. Intervertebral disc spaces are maintained.   IMPRESSION: Negative.  PATIENT SURVEYS:  03/15/23: FOTO eval:  46%   SCREENING FOR RED FLAGS: Bowel or bladder incontinence: No Cauda equina syndrome: No  COGNITION: Overall cognitive status: WFL normal      SENSATION: WFL    POSTURE:  No Significant postural limitations, rounded shoulders, and forward head  PALPATION: TTP: left lumbar paraspinals and left QL  LUMBAR ROM:   Directional Preference Assessment: Centralization: with extension    AROM 03/15/23  Flexion 65  Extension 15 pain noted  Right lateral flexion 32 pain noted  Left lateral flexion 30 pain noted  Right rotation WFL  Left rotation WFL   (Blank rows = not tested)  LOWER EXTREMITY ROM:      Right 03/15/23 Left 03/15/23  Hip flexion 105 100  Hip extension    Hip abduction    Hip adduction    Hip internal rotation    Hip external rotation    Knee flexion    Knee extension    Ankle dorsiflexion    Ankle plantarflexion    Ankle inversion    Ankle eversion     (Blank rows = not tested)  LOWER EXTREMITY MMT:    MMT Right 03/15/23 Left 03/15/23   Hip flexion 4 3  Hip extension 4 3  Hip abduction    Hip adduction    Hip internal rotation    Hip external rotation    Knee flexion  4  Knee extension  3  Ankle dorsiflexion    Ankle plantarflexion    Ankle inversion    Ankle eversion     (Blank rows = not tested)  LUMBAR SPECIAL TESTS:  03/15/23: positive SLR and positive slump test on the left  FUNCTIONAL TESTS:  Sit to stand from 18 inch chair c UE support: 5.6 seconds  GAIT: Clinic distances with antalgic gait pattern  TODAY'S TREATMENT:                                                                                                         DATE:  03/15/23:   Therex:    HEP instruction/performance c cues for techniques, handout provided.  Trial set performed of each for comprehension and symptom assessment.  See below for exercise list.   PATIENT EDUCATION:  Education details: HEP, POC Person educated: Patient Education method: Explanation, Demonstration, Verbal cues, and Handouts Education comprehension: verbalized understanding, returned demonstration, and verbal cues required  HOME EXERCISE PROGRAM: Access Code: [facility] URL: [email or web address] Date: 03/15/2023 Prepared by: Delon Lunger  Exercises - Supine Bridge  - 2 x daily - 7 x weekly - 2 sets - 10 reps - 5 seonds hold - Supine Lower Trunk Rotation  - 2 x daily - 7 x weekly - 3 reps - 20 seconds hold - Supine Piriformis Stretch with Foot on Ground  - 2 x daily - 7 x weekly - 3 reps - 20 seconds hold - Standing Lumbar Extension at Wall - Forearms  - 2 x daily - 7 x weekly - 5 reps - 10 seconds hold  ASSESSMENT:  CLINICAL IMPRESSION: Patient is a 43 y.o. who comes to clinic with complaints of low back pain which is radiating down  pt's left LE into his calf. Pt presenting with difficulty with toileting and transfers as well as affected sleep. 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 Pt presenting with mobility, strength and movement coordination deficits that impair their ability to perform usual daily and recreational functional activities without increase difficulty/symptoms at this time.  Patient to benefit from skilled PT services to address impairments and limitations to improve to previous level of function without restriction secondary to condition.   OBJECTIVE IMPAIRMENTS: decreased activity tolerance, decreased mobility, difficulty walking, decreased ROM, decreased strength, impaired flexibility, and pain.   ACTIVITY LIMITATIONS: bending, sitting, standing, squatting, sleeping, transfers, and toileting  PARTICIPATION LIMITATIONS: community activity, occupation, and yard work  PERSONAL FACTORS:  see pertinent history above  are also affecting patient's functional outcome.   REHAB POTENTIAL: Good  CLINICAL DECISION MAKING: Evolving/moderate complexity  EVALUATION COMPLEXITY: Low   GOALS: Goals reviewed with patient? Yes  SHORT TERM GOALS: (target date for Short term goals are 3 weeks 04/05/2023)  1. Patient will demonstrate independent use of home exercise program to maintain progress from in clinic treatments.  Goal status: New  LONG TERM GOALS: (target dates for all long term goals are 10 weeks  05/24/2023 )   1. Patient will demonstrate/report pain at worst less than or equal to 2/10 to facilitate minimal limitation in daily activity secondary to pain symptoms.  Goal status: New   2. Patient will demonstrate independent use of home exercise program to facilitate ability to maintain/progress functional gains from skilled physical therapy services.  Goal status: New   3. Patient will demonstrate FOTO outcome >  or  = 63 % to indicate reduced disability due to condition.  Goal status: New   4. Patient will demonstrate lumbar extension 100 % WFL s symptoms to facilitate upright standing, walking posture at PLOF s limitation.  Goal status: New   5.  Pt will be able to perform sit to stand with no pain reported from 18 inch chair with no UE support.   Goal status: New   6.  Pt will be able to lift 20# from floor to counter height using recommended lifting techniques with back pain </= 2/10.  Goal status: New     PLAN:  PT FREQUENCY: 1-x/week  PT DURATION: 10 weeks  PLANNED INTERVENTIONS: Can include 02853- PT Re-evaluation, 97110-Therapeutic exercises, 97530- Therapeutic activity, [id number]- Neuromuscular re-education, 97535- Self Care, 97140- Manual therapy, [phone]- Gait training, [phone]- Orthotic Fit/training, [phone]- Canalith repositioning, [id number]- Aquatic Therapy, 97014- Electrical stimulation (unattended), [id number]- Electrical stimulation (manual), [id number]- Vasopneumatic device, [id number]- Ultrasound, [id number]- Traction (mechanical), [id number]- Ionotophoresis 4mg /ml Dexamethasone , Patient/Family education, Balance training, Stair training, Taping, Dry Needling, Joint mobilization, Joint manipulation, Spinal manipulation, Spinal mobilization, Scar mobilization, Vestibular training, Visual/preceptual remediation/compensation, DME instructions, Cryotherapy, and Moist heat.  All performed as medically necessary.  All included unless contraindicated  PLAN FOR NEXT SESSION: Review HEP knowledge/results, core strengthening, manual as needed Try Traction, extension based exercises       Delon JONELLE Lunger, PT, MPT 03/15/2023, 2:42 PM
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
## 2023-03-22 ENCOUNTER — Ambulatory Visit (INDEPENDENT_AMBULATORY_CARE_PROVIDER_SITE_OTHER): Payer: BC Managed Care – PPO | Admitting: Physical Therapy

## 2023-03-22 ENCOUNTER — Encounter: Payer: Self-pay | Admitting: Physical Therapy

## 2023-03-22 DIAGNOSIS — M6281 Muscle weakness (generalized): Secondary | ICD-10-CM

## 2023-03-22 DIAGNOSIS — M5416 Radiculopathy, lumbar region: Secondary | ICD-10-CM

## 2023-03-22 DIAGNOSIS — J301 Allergic rhinitis due to pollen: Secondary | ICD-10-CM | POA: Diagnosis not present

## 2023-03-22 DIAGNOSIS — J3081 Allergic rhinitis due to animal (cat) (dog) hair and dander: Secondary | ICD-10-CM | POA: Diagnosis not present

## 2023-03-22 DIAGNOSIS — M5459 Other low back pain: Secondary | ICD-10-CM | POA: Diagnosis not present

## 2023-03-22 DIAGNOSIS — R262 Difficulty in walking, not elsewhere classified: Secondary | ICD-10-CM | POA: Diagnosis not present

## 2023-03-22 DIAGNOSIS — J3089 Other allergic rhinitis: Secondary | ICD-10-CM | POA: Diagnosis not present

## 2023-03-22 NOTE — Therapy (Addendum)
OUTPATIENT PHYSICAL THERAPY THORACOLUMBAR EVALUATION   Patient Name: Nathan Rowland MRN: [id number] DOB:07/09/1980, 43 y.o., male Today's Date: 03/22/2023  END OF SESSION:  PT End of Session - 03/22/23 1324     Visit Number 2    Number of Visits 10    Date for PT Re-Evaluation 05/24/23    PT Start Time 1258    PT Stop Time 1326    PT Time Calculation (min) 28 min    Activity Tolerance Patient tolerated treatment well;Patient limited by pain    Behavior During Therapy [facility] for tasks assessed/performed              Past Medical History:  Diagnosis Date   Allergy 11/29/1996   Anemia    Blood transfusion without reported diagnosis    Clotting disorder (HCC)    Finger fracture    Kidney calculus    Left knee pain    dx'd with L retropatellar knee pain   Past Surgical History:  Procedure Laterality Date   COLONOSCOPY WITH PROPOFOL N/A 07/14/2022   Procedure: COLONOSCOPY WITH PROPOFOL;  Surgeon: Sherrilyn Rist, MD;  Location: [facility] ENDOSCOPY;  Service: Gastroenterology;  Laterality: N/A;   ENTEROSCOPY N/A 07/12/2022   Procedure: ENTEROSCOPY;  Surgeon: Sherrilyn Rist, MD;  Location: [facility] ENDOSCOPY;  Service: Gastroenterology;  Laterality: N/A;   GIVENS CAPSULE STUDY N/A 07/12/2022   Procedure: GIVENS CAPSULE STUDY;  Surgeon: Sherrilyn Rist, MD;  Location: [facility] ENDOSCOPY;  Service: Gastroenterology;  Laterality: N/A;   IR EMBO ART  VEN HEMORR LYMPH EXTRAV  INC GUIDE ROADMAPPING  07/21/2022   IR RADIOLOGIST EVAL & MGMT  06/02/2022   IR TIPS  07/21/2022   IR US GUIDE VASC ACCESS LEFT  07/21/2022   IR US GUIDE VASC ACCESS RIGHT  07/21/2022   IR US GUIDE VASC ACCESS RIGHT  07/21/2022   RADIOLOGY WITH ANESTHESIA N/A 07/21/2022   Procedure: TIPS;  Surgeon: Bennie Dallas, MD;  Location: [facility] OR;  Service: Radiology;  Laterality: N/A;   SUBMUCOSAL TATTOO INJECTION  07/12/2022   Procedure: SUBMUCOSAL TATTOO INJECTION;  Surgeon: Sherrilyn Rist, MD;  Location: [facility]  ENDOSCOPY;  Service: Gastroenterology;;   Patient Active Problem List   Diagnosis Date Noted   History of GI bleed 07/28/2022   Ileus (HCC) 07/28/2022   Pulmonary embolus (HCC) 07/28/2022   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   Chronic anticoagulation 07/28/2022   Abdominal pain 07/27/2022   Current use of long term anticoagulation 07/13/2022   Portal hypertension (HCC) 07/13/2022   Acute upper GI bleed 07/12/2022   Blood loss anemia 07/12/2022   Esophageal varices (HCC) 07/12/2022   Melena 07/12/2022   Acute hypoxic respiratory failure (HCC) 07/12/2022   Iron deficiency anemia 07/05/2022   Back strain 05/26/2022   Kidney calculus 08/04/2021   Portal vein thrombosis 03/18/2021   Concussion 03/04/2021   Administrative encounter 05/31/2012   ALLERGIC RHINITIS 05/24/2008   EUSTACHIAN TUBE DYSFUNCTION, BILATERAL 11/15/2007    PCP: Joaquim Nam, MD   REFERRING PROVIDER: Hannah Beat, MD   REFERRING DIAG:    M54.9,G89.29 (ICD-10-CM) - Chronic back pain greater than 3 months duration    Rationale for Evaluation and Treatment: Rehabilitation  THERAPY DIAG:  Other low back pain  Muscle weakness (generalized)  Radiculopathy, lumbar region  Difficulty in walking, not elsewhere classified  ONSET DATE: Back pain has been ongoing for about 20 years. Pt stating around Halloween 2024 his pain started to get  worse.   SUBJECTIVE:                                                                                                                                                                                           SUBJECTIVE STATEMENT: Pt arriving today reporting worsening pain in his low back and running into his left LE.   PERTINENT HISTORY:  Pt on blood thinner See history in chart above  PAIN:  NPRS scale 0/10 pain at rest, getting out of the truck 4/10, worse pain 8/10 Pain location: low back, Left LE Pain description: sharp,  achy/dull Aggravating factors: transitions Relieving factors: standing  PRECAUTIONS: None  WEIGHT BEARING RESTRICTIONS: No  FALLS:  Has patient fallen in last 6 months? No  LIVING ENVIRONMENT: Lives with: lives with their family and lives with their spouse Lives in: House/apartment Stairs: yes Has following equipment at home: None  OCCUPATION: Nurse, mental health  PLOF: Independent  PATIENT GOALS: Be able to perform sit to stand and toilet without pain  Next MD Visit: no follow up scheduled at present time   OBJECTIVE:   DIAGNOSTIC FINDINGS:  03/03/23 FINDINGS: There is no evidence of lumbar spine fracture. Alignment is normal. Intervertebral disc spaces are maintained.   IMPRESSION: Negative.  PATIENT SURVEYS:  03/15/23: FOTO eval:  46%   SCREENING FOR RED FLAGS: Bowel or bladder incontinence: No Cauda equina syndrome: No  COGNITION: Overall cognitive status: WFL normal      SENSATION: WFL    POSTURE:  No Significant postural limitations, rounded shoulders, and forward head  PALPATION: TTP: left lumbar paraspinals and left QL  LUMBAR ROM:   Directional Preference Assessment: Centralization: with extension    AROM 03/15/23  Flexion 65  Extension 15 pain noted  Right lateral flexion 32 pain noted  Left lateral flexion 30 pain noted  Right rotation WFL  Left rotation WFL   (Blank rows = not tested)  LOWER EXTREMITY ROM:      Right 03/15/23 Left 03/15/23  Hip flexion 105 100  Hip extension    Hip abduction    Hip adduction    Hip internal rotation    Hip external rotation    Knee flexion    Knee extension    Ankle dorsiflexion    Ankle plantarflexion    Ankle inversion    Ankle eversion     (Blank rows = not tested)  LOWER EXTREMITY MMT:    MMT Right 03/15/23 Left 03/15/23  Hip flexion 4 3  Hip extension 4 3  Hip abduction    Hip adduction    Hip internal rotation    Hip  external rotation    Knee flexion  4  Knee extension   3  Ankle dorsiflexion    Ankle plantarflexion    Ankle inversion    Ankle eversion     (Blank rows = not tested)  LUMBAR SPECIAL TESTS:  03/15/23: positive SLR and positive slump test on the left  FUNCTIONAL TESTS:  Sit to stand from 18 inch chair c UE support: 5.6 seconds  GAIT: Clinic distances with antalgic gait pattern                                                                                                                                                                                                                   TODAY'S TREATMENT:                                                                                                          DATE:  03/15/23:   TherEx:  Standing trunk extension x 10 holding 10 sec Reviewed all HEP verbally and added glute stretch pushing knee away to pt's home program Nustep attempted, but pt unable to tolerate due to increased pain.  Lumbar Traction X 20 minutes, 100#-80# pull    DATE:  03/15/23:   Therex:    HEP instruction/performance c cues for techniques, handout provided.  Trial set performed of each for comprehension and symptom assessment.  See below for exercise list.   PATIENT EDUCATION:  Education details: HEP, POC Person educated: Patient Education method: Explanation, Demonstration, Verbal cues, and Handouts Education comprehension: verbalized understanding, returned demonstration, and verbal cues required  HOME EXERCISE PROGRAM: Access Code: [facility] URL: [email or web address] Date: 03/22/2023 Prepared by: Narda Amber  Exercises - Supine Bridge  - 2 x daily - 7 x weekly - 2 sets - 10 reps - 5 seonds hold - Supine Lower Trunk Rotation  - 2 x daily - 7 x weekly - 3 reps - 20 seconds hold - Supine Piriformis Stretch with Foot on Ground  - 2 x daily - 7 x weekly - 3 reps - 20 seconds hold - Standing  Lumbar Extension at Wall - Forearms  - 2 x daily - 7 x weekly - 5 reps - 10 seconds hold - Supine  Figure 4 Piriformis Stretch  - 2 x daily - 7 x weekly - 3 reps - 20 seconds hold  ASSESSMENT:  CLINICAL IMPRESSION: Pt arriving today reporting worsening pain since his last visit. Pt stating his HEP has been going well. Pt stating he has messaged his MD and there is discussion of MRI if pain doesn't decrease. Pt wishing to try mechanical traction this visit. Pt reporting increased movements following traction. Recommending continued skilled PT to monitor symptoms and progress as pt tolerates.     OBJECTIVE IMPAIRMENTS: decreased activity tolerance, decreased mobility, difficulty walking, decreased ROM, decreased strength, impaired flexibility, and pain.   ACTIVITY LIMITATIONS: bending, sitting, standing, squatting, sleeping, transfers, and toileting  PARTICIPATION LIMITATIONS: community activity, occupation, and yard work  PERSONAL FACTORS:  see pertinent history above  are also affecting patient's functional outcome.   REHAB POTENTIAL: Good  CLINICAL DECISION MAKING: Evolving/moderate complexity  EVALUATION COMPLEXITY: Low   GOALS: Goals reviewed with patient? Yes  SHORT TERM GOALS: (target date for Short term goals are 3 weeks 04/05/2023)  1. Patient will demonstrate independent use of home exercise program to maintain progress from in clinic treatments.  Goal status: New  LONG TERM GOALS: (target dates for all long term goals are 10 weeks  05/24/2023 )   1. Patient will demonstrate/report pain at worst less than or equal to 2/10 to facilitate minimal limitation in daily activity secondary to pain symptoms.  Goal status: on-going 03/22/23   2. Patient will demonstrate independent use of home exercise program to facilitate ability to maintain/progress functional gains from skilled physical therapy services.  Goal status: New   3. Patient will demonstrate FOTO outcome > or = 63 % to indicate reduced disability due to condition.  Goal status: New   4. Patient will  demonstrate lumbar extension 100 % WFL s symptoms to facilitate upright standing, walking posture at PLOF s limitation.  Goal status: New   5.  Pt will be able to perform sit to stand with no pain reported from 18 inch chair with no UE support.   Goal status: New   6.  Pt will be able to lift 20# from floor to counter height using recommended lifting techniques with back pain </= 2/10.  Goal status: New     PLAN:  PT FREQUENCY: 1-x/week  PT DURATION: 10 weeks  PLANNED INTERVENTIONS: Can include 60454- PT Re-evaluation, 97110-Therapeutic exercises, 97530- Therapeutic activity, [id number]- Neuromuscular re-education, 97535- Self Care, 97140- Manual therapy, [phone]- Gait training, [phone]- Orthotic Fit/training, [phone]- Canalith repositioning, [id number]- Aquatic Therapy, 97014- Electrical stimulation (unattended), [id number]- Electrical stimulation (manual), [id number]- Vasopneumatic device, [id number]- Ultrasound, [id number]- Traction (mechanical), [id number]- Ionotophoresis 4mg /ml Dexamethasone, Patient/Family education, Balance training, Stair training, Taping, Dry Needling, Joint mobilization, Joint manipulation, Spinal manipulation, Spinal mobilization, Scar mobilization, Vestibular training, Visual/preceptual remediation/compensation, DME instructions, Cryotherapy, and Moist heat.  All performed as medically necessary.  All included unless contraindicated  PLAN FOR NEXT SESSION: core strengthening, manual as needed extension based exercises  Assess response to traction, increase pull if pt reporting good response     Sharmon Leyden, PT, MPT 03/22/2023, 1:26 PM

## 2023-03-28 ENCOUNTER — Encounter: Payer: Self-pay | Admitting: Physical Therapy

## 2023-03-28 ENCOUNTER — Ambulatory Visit: Payer: BC Managed Care – PPO | Admitting: Physical Therapy

## 2023-03-28 DIAGNOSIS — R262 Difficulty in walking, not elsewhere classified: Secondary | ICD-10-CM

## 2023-03-28 DIAGNOSIS — M6281 Muscle weakness (generalized): Secondary | ICD-10-CM

## 2023-03-28 DIAGNOSIS — M5416 Radiculopathy, lumbar region: Secondary | ICD-10-CM

## 2023-03-28 DIAGNOSIS — M5459 Other low back pain: Secondary | ICD-10-CM

## 2023-03-28 NOTE — Therapy (Signed)
OUTPATIENT PHYSICAL THERAPY THORACOLUMBAR   Patient Name: Nathan Rowland MRN: [id number] DOB:Nov 18, 1980, 43 y.o., male Today's Date: 03/28/2023  END OF SESSION:  PT End of Session - 03/28/23 1510     Visit Number 3    Number of Visits 10    Date for PT Re-Evaluation 05/24/23    PT Start Time 1515    PT Stop Time 1545    PT Time Calculation (min) 30 min    Activity Tolerance Patient tolerated treatment well;Patient limited by pain    Behavior During Therapy [facility] for tasks assessed/performed               Past Medical History:  Diagnosis Date   Allergy 11/29/1996   Anemia    Blood transfusion without reported diagnosis    Clotting disorder (HCC)    Finger fracture    Kidney calculus    Left knee pain    dx'd with L retropatellar knee pain   Past Surgical History:  Procedure Laterality Date   COLONOSCOPY WITH PROPOFOL N/A 07/14/2022   Procedure: COLONOSCOPY WITH PROPOFOL;  Surgeon: Sherrilyn Rist, MD;  Location: [facility] ENDOSCOPY;  Service: Gastroenterology;  Laterality: N/A;   ENTEROSCOPY N/A 07/12/2022   Procedure: ENTEROSCOPY;  Surgeon: Sherrilyn Rist, MD;  Location: [facility] ENDOSCOPY;  Service: Gastroenterology;  Laterality: N/A;   GIVENS CAPSULE STUDY N/A 07/12/2022   Procedure: GIVENS CAPSULE STUDY;  Surgeon: Sherrilyn Rist, MD;  Location: [facility] ENDOSCOPY;  Service: Gastroenterology;  Laterality: N/A;   IR EMBO ART  VEN HEMORR LYMPH EXTRAV  INC GUIDE ROADMAPPING  07/21/2022   IR RADIOLOGIST EVAL & MGMT  06/02/2022   IR TIPS  07/21/2022   IR US GUIDE VASC ACCESS LEFT  07/21/2022   IR US GUIDE VASC ACCESS RIGHT  07/21/2022   IR US GUIDE VASC ACCESS RIGHT  07/21/2022   RADIOLOGY WITH ANESTHESIA N/A 07/21/2022   Procedure: TIPS;  Surgeon: Bennie Dallas, MD;  Location: [facility] OR;  Service: Radiology;  Laterality: N/A;   SUBMUCOSAL TATTOO INJECTION  07/12/2022   Procedure: SUBMUCOSAL TATTOO INJECTION;  Surgeon: Sherrilyn Rist, MD;  Location: [facility] ENDOSCOPY;   Service: Gastroenterology;;   Patient Active Problem List   Diagnosis Date Noted   History of GI bleed 07/28/2022   Ileus (HCC) 07/28/2022   Pulmonary embolus (HCC) 07/28/2022   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   Chronic anticoagulation 07/28/2022   Abdominal pain 07/27/2022   Current use of long term anticoagulation 07/13/2022   Portal hypertension (HCC) 07/13/2022   Acute upper GI bleed 07/12/2022   Blood loss anemia 07/12/2022   Esophageal varices (HCC) 07/12/2022   Melena 07/12/2022   Acute hypoxic respiratory failure (HCC) 07/12/2022   Iron deficiency anemia 07/05/2022   Back strain 05/26/2022   Kidney calculus 08/04/2021   Portal vein thrombosis 03/18/2021   Concussion 03/04/2021   Administrative encounter 05/31/2012   ALLERGIC RHINITIS 05/24/2008   EUSTACHIAN TUBE DYSFUNCTION, BILATERAL 11/15/2007    PCP: Joaquim Nam, MD   REFERRING PROVIDER: Hannah Beat, MD   REFERRING DIAG:    M54.9,G89.29 (ICD-10-CM) - Chronic back pain greater than 3 months duration    Rationale for Evaluation and Treatment: Rehabilitation  THERAPY DIAG:  Other low back pain  Muscle weakness (generalized)  Radiculopathy, lumbar region  Difficulty in walking, not elsewhere classified  ONSET DATE: Back pain has been ongoing for about 20 years. Pt stating around Halloween 2024 his pain started to get  worse.   SUBJECTIVE:                                                                                                                                                                                           SUBJECTIVE STATEMENT: Pt reporting pain radiating down left LE into his foot/ankle. Pt reporting 7-8/10 pain with transferring in/out of his truck. Pt reporting traction relief lasted about 1 hour after our last session.   PERTINENT HISTORY:  Pt on blood thinner See history in chart above  PAIN:  NPRS scale 2/10 pain at rest, getting out  of the truck 7/10, worse pain 8-9/10 Pain location: low back, Left LE Pain description: sharp, achy/dull Aggravating factors: transitions Relieving factors: standing  PRECAUTIONS: None  WEIGHT BEARING RESTRICTIONS: No  FALLS:  Has patient fallen in last 6 months? No  LIVING ENVIRONMENT: Lives with: lives with their family and lives with their spouse Lives in: House/apartment Stairs: yes Has following equipment at home: None  OCCUPATION: Nurse, mental health  PLOF: Independent  PATIENT GOALS: Be able to perform sit to stand and toilet without pain  Next MD Visit: no follow up scheduled at present time   OBJECTIVE:   DIAGNOSTIC FINDINGS:  03/03/23 FINDINGS: There is no evidence of lumbar spine fracture. Alignment is normal. Intervertebral disc spaces are maintained.   IMPRESSION: Negative.  PATIENT SURVEYS:  03/15/23: FOTO eval:  46%   SCREENING FOR RED FLAGS: Bowel or bladder incontinence: No Cauda equina syndrome: No  COGNITION: Overall cognitive status: WFL normal      SENSATION: WFL    POSTURE:  No Significant postural limitations, rounded shoulders, and forward head  PALPATION: TTP: left lumbar paraspinals and left QL  LUMBAR ROM:   Directional Preference Assessment: Centralization: with extension    AROM 03/15/23  Flexion 65  Extension 15 pain noted  Right lateral flexion 32 pain noted  Left lateral flexion 30 pain noted  Right rotation WFL  Left rotation WFL   (Blank rows = not tested)  LOWER EXTREMITY ROM:      Right 03/15/23 Left 03/15/23  Hip flexion 105 100  Hip extension    Hip abduction    Hip adduction    Hip internal rotation    Hip external rotation    Knee flexion    Knee extension    Ankle dorsiflexion    Ankle plantarflexion    Ankle inversion    Ankle eversion     (Blank rows = not tested)  LOWER EXTREMITY MMT:    MMT Right 03/15/23 Left 03/15/23  Hip flexion 4 3  Hip extension 4 3  Hip  abduction    Hip  adduction    Hip internal rotation    Hip external rotation    Knee flexion  4  Knee extension  3  Ankle dorsiflexion    Ankle plantarflexion    Ankle inversion    Ankle eversion     (Blank rows = not tested)  LUMBAR SPECIAL TESTS:  03/15/23: positive SLR and positive slump test on the left  FUNCTIONAL TESTS:  Sit to stand from 18 inch chair c UE support: 5.6 seconds  GAIT: Clinic distances with antalgic gait pattern                                                                                                                                                                                                                   TODAY'S TREATMENT:                                                                                                          DATE:  03/28/23:   Lumbar Traction X 25 minutes, 120#-100# pull   DATE:  03/22/23:   TherEx:  Standing trunk extension x 10 holding 10 sec Reviewed all HEP verbally and added glute stretch pushing knee away to pt's home program Nustep attempted, but pt unable to tolerate due to increased pain.  Lumbar Traction X 20 minutes, 100#-80# pull    DATE:  03/15/23:   Therex:    HEP instruction/performance c cues for techniques, handout provided.  Trial set performed of each for comprehension and symptom assessment.  See below for exercise list.   PATIENT EDUCATION:  Education details: HEP, POC Person educated: Patient Education method: Explanation, Demonstration, Verbal cues, and Handouts Education comprehension: verbalized understanding, returned demonstration, and verbal cues required  HOME EXERCISE PROGRAM: Access Code: [facility] URL: [email or web address] Date: 03/22/2023 Prepared by: Narda Amber  Exercises - Supine Bridge  - 2 x daily - 7 x weekly - 2 sets - 10 reps - 5 seonds hold - Supine Lower Trunk Rotation  - 2 x daily - 7 x weekly - 3 reps -  20 seconds hold - Supine Piriformis Stretch with Foot on  Ground  - 2 x daily - 7 x weekly - 3 reps - 20 seconds hold - Standing Lumbar Extension at Wall - Forearms  - 2 x daily - 7 x weekly - 5 reps - 10 seconds hold - Supine Figure 4 Piriformis Stretch  - 2 x daily - 7 x weekly - 3 reps - 20 seconds hold  ASSESSMENT:  CLINICAL IMPRESSION: Pt arriving to therapy reporting 2/10 back pain radiating down left ankle. Pt reporting traction relief lasted about 1 hour following last treatment. Mechanical traction tried again with increased pull this session. We will continue with skilled PT to pt's tolerance. I discussed with pt about recommending follow up with Dr. Dallas Schimke for further evaluation if pain continues.     OBJECTIVE IMPAIRMENTS: decreased activity tolerance, decreased mobility, difficulty walking, decreased ROM, decreased strength, impaired flexibility, and pain.   ACTIVITY LIMITATIONS: bending, sitting, standing, squatting, sleeping, transfers, and toileting  PARTICIPATION LIMITATIONS: community activity, occupation, and yard work  PERSONAL FACTORS:  see pertinent history above  are also affecting patient's functional outcome.   REHAB POTENTIAL: Good  CLINICAL DECISION MAKING: Evolving/moderate complexity  EVALUATION COMPLEXITY: Low   GOALS: Goals reviewed with patient? Yes  SHORT TERM GOALS: (target date for Short term goals are 3 weeks 04/05/2023)   1. Patient will demonstrate independent use of home exercise program to maintain progress from in clinic treatments.  Goal status: New  LONG TERM GOALS: (target dates for all long term goals are 10 weeks  05/24/2023 )   1. Patient will demonstrate/report pain at worst less than or equal to 2/10 to facilitate minimal limitation in daily activity secondary to pain symptoms.  Goal status: on-going 03/22/23   2. Patient will demonstrate independent use of home exercise program to facilitate ability to maintain/progress functional gains from skilled physical therapy services.  Goal  status: New   3. Patient will demonstrate FOTO outcome > or = 63 % to indicate reduced disability due to condition.  Goal status: New   4. Patient will demonstrate lumbar extension 100 % WFL s symptoms to facilitate upright standing, walking posture at PLOF s limitation.  Goal status: New   5.  Pt will be able to perform sit to stand with no pain reported from 18 inch chair with no UE support.   Goal status: New   6.  Pt will be able to lift 20# from floor to counter height using recommended lifting techniques with back pain </= 2/10.  Goal status: New     PLAN:  PT FREQUENCY: 1-x/week  PT DURATION: 10 weeks  PLANNED INTERVENTIONS: Can include 16109- PT Re-evaluation, 97110-Therapeutic exercises, 97530- Therapeutic activity, [id number]- Neuromuscular re-education, 97535- Self Care, 97140- Manual therapy, [phone]- Gait training, [phone]- Orthotic Fit/training, [id number]- Canalith repositioning, [id number]- Aquatic Therapy, 97014- Electrical stimulation (unattended), [id number]- Electrical stimulation (manual), [id number]- Vasopneumatic device, [id number]- Ultrasound, [id number]- Traction (mechanical), [id number]- Ionotophoresis 4mg /ml Dexamethasone, Patient/Family education, Balance training, Stair training, Taping, Dry Needling, Joint mobilization, Joint manipulation, Spinal manipulation, Spinal mobilization, Scar mobilization, Vestibular training, Visual/preceptual remediation/compensation, DME instructions, Cryotherapy, and Moist heat.  All performed as medically necessary.  All included unless contraindicated  PLAN FOR NEXT SESSION: core strengthening, manual as needed extension based exercises  Assess response to traction     Sharmon Leyden, PT, MPT 03/28/2023, 3:49 PM

## 2023-04-01 NOTE — Addendum Note (Signed)
Addended by: Hannah Beat on: 04/01/2023 09:43 AM   Modules accepted: Orders

## 2023-04-04 IMAGING — CT CT ABD-PELV W/ CM
2 of 4 series · 14 of 46 positions shown, 16 images · IV contrast (APPLIED)
Comparison: 02/20/2021
COMPARISON: 02/20/2021

Addendum:
CLINICAL DATA: Bowel obstruction suspected, abdominal pain

EXAM:
CT ABDOMEN AND PELVIS WITH CONTRAST
TECHNIQUE: Multidetector CT imaging of the abdomen and pelvis was performed
using the standard protocol following bolus administration of
intravenous contrast.

[Series 3: abd/ pelvis 5.0 i30f 2 · axial · 0.94mm/px · z∈[+829,+1314]mm · 11 of 112 slices shown, 13 images]
[im 10/112  soft-tissue]
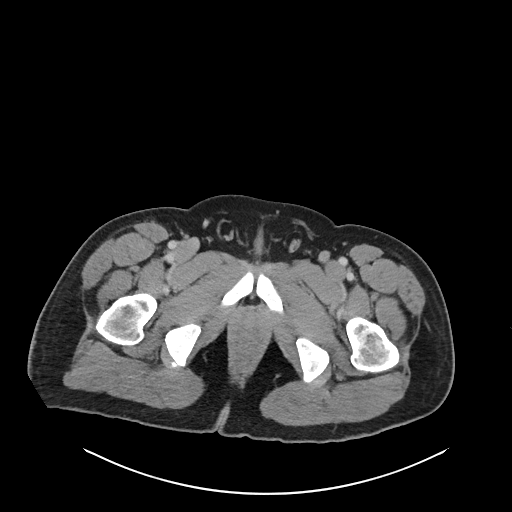
[im 10/112  bone]
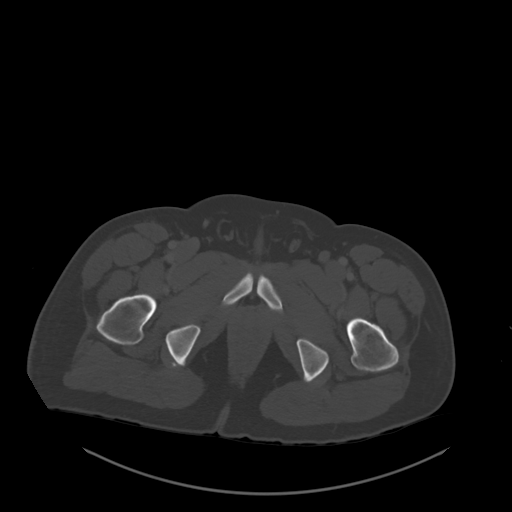
[im 20/112  soft-tissue]
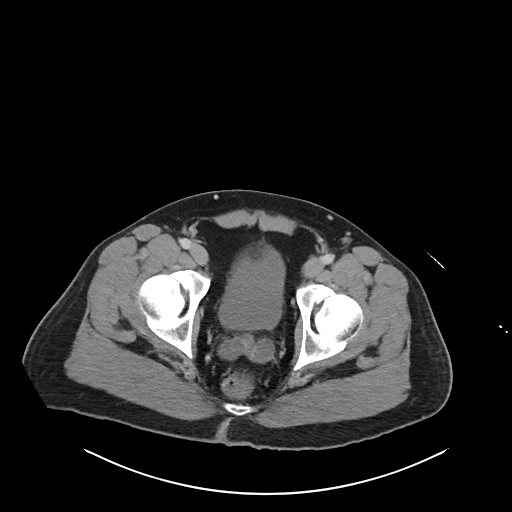
[im 29/112  soft-tissue]
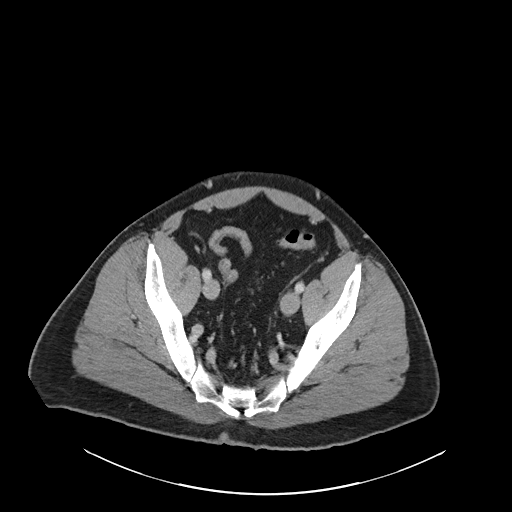
[im 39/112  soft-tissue]
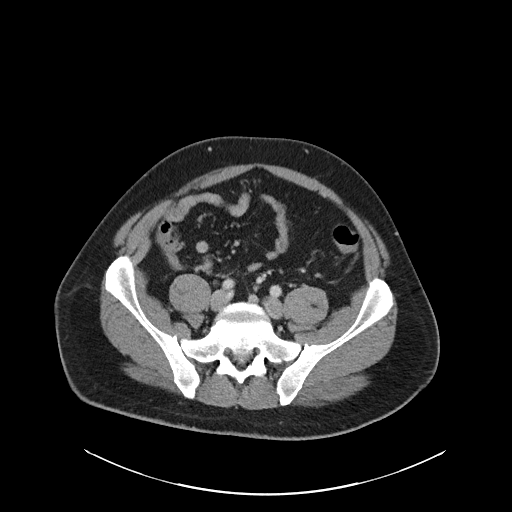
[im 49/112  soft-tissue]
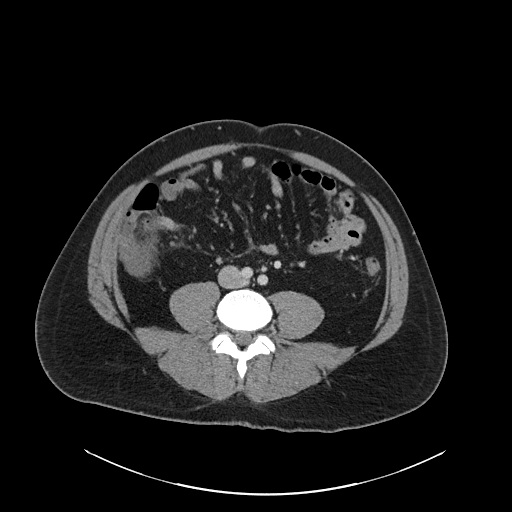
[im 58/112  soft-tissue]
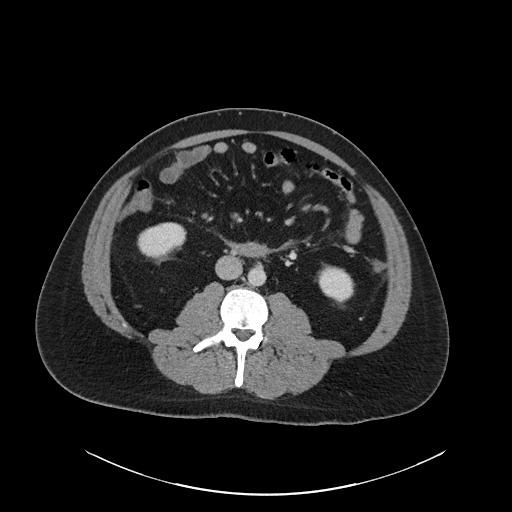
[im 68/112  soft-tissue]
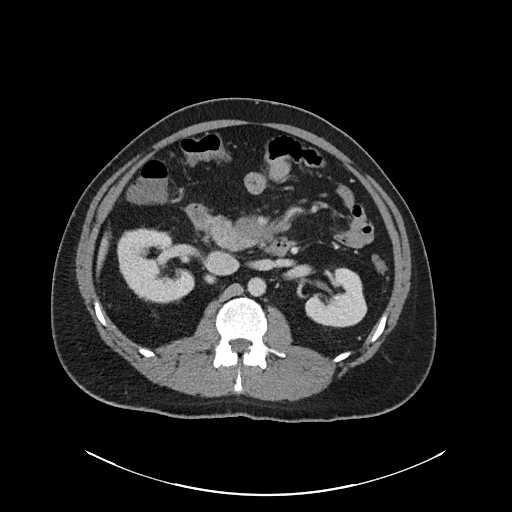
[im 78/112  soft-tissue]
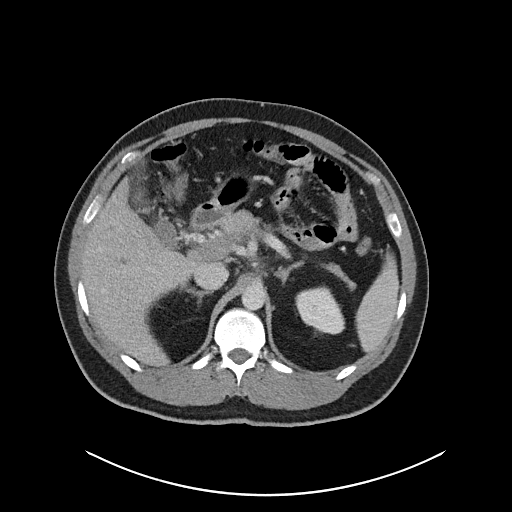
[im 87/112  soft-tissue]
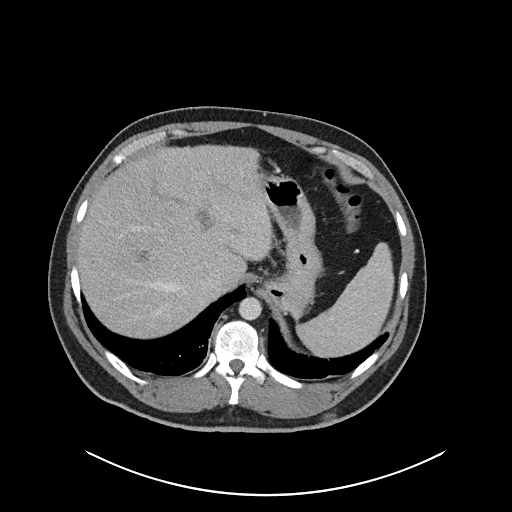
[im 87/112  bone]
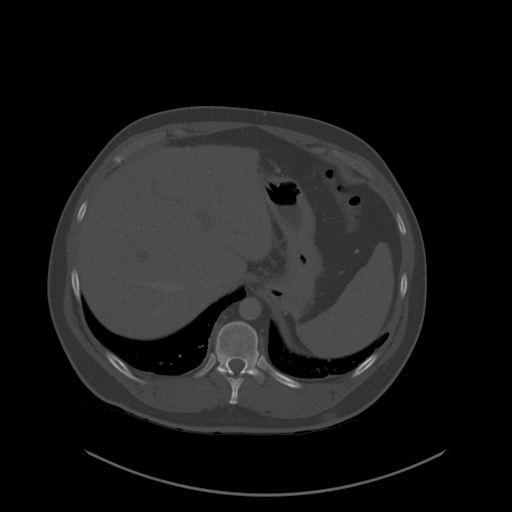
[im 97/112  soft-tissue]
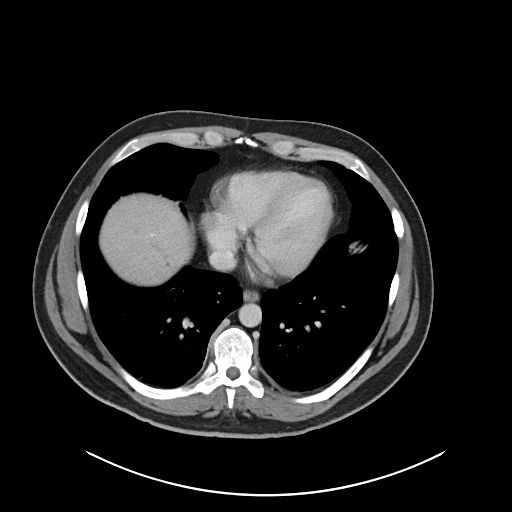
[im 107/112  soft-tissue]
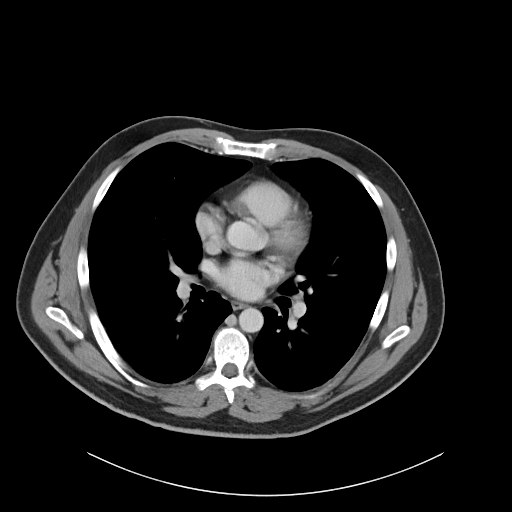

[Series 6: coronal soft tissue · coronal · 0.88mm/px · 3 of 117 slices shown]
[im 39/117  soft-tissue]
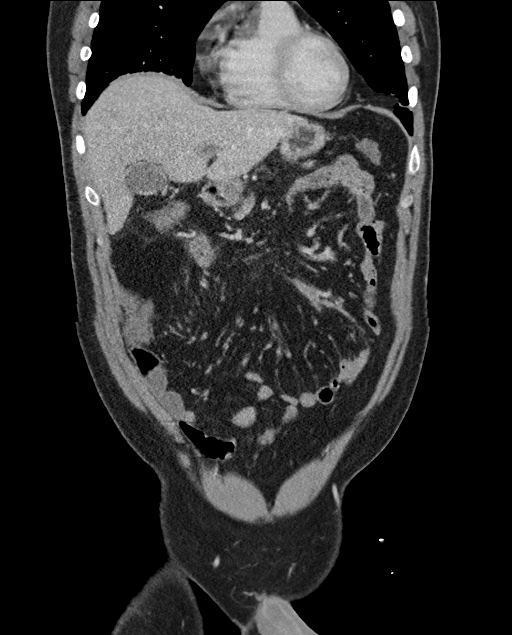
[im 52/117  soft-tissue]
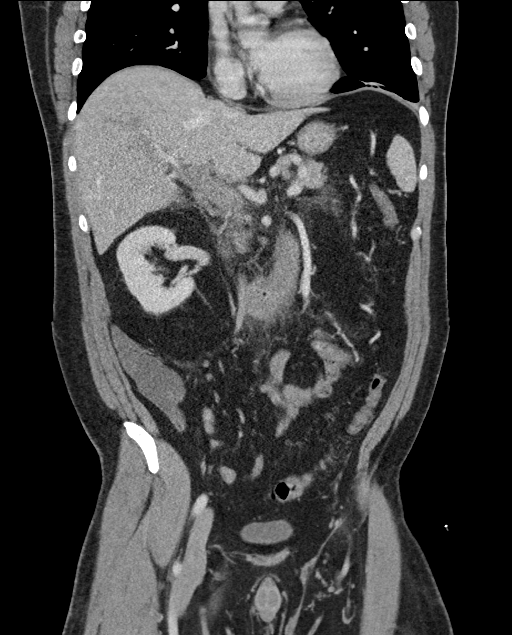
[im 65/117  soft-tissue]
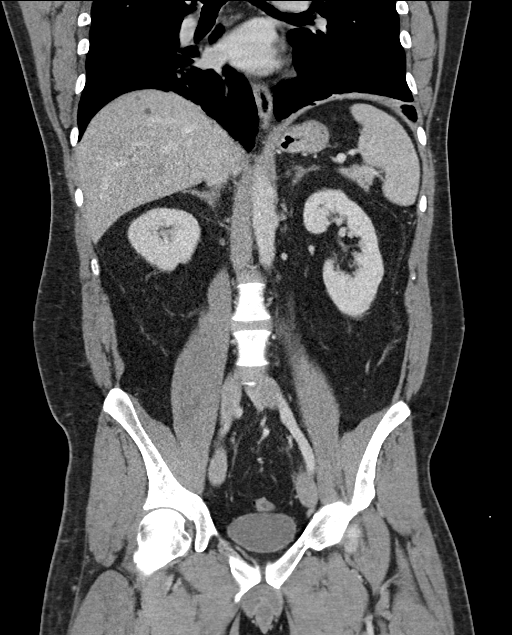

[14 of 46 positions shown; findings below may reference images not displayed]

RADIATION DOSE REDUCTION: This exam was performed according to the
departmental dose-optimization program which includes automated
exposure control, adjustment of the mA and/or kV according to
patient size and/or use of iterative reconstruction technique.

CONTRAST:  100mL OMNIPAQUE IOHEXOL 300 MG/ML  SOLN
FINDINGS: Lower chest: No acute abnormality.

Hepatobiliary: Normal liver contours. Multiple low-density
subcentimeter lesions, most likely hepatic cysts but too small to
characterize, unchanged from the prior exam. No gallbladder wall
thickening, pericholecystic fluid, or significant cholelithiasis. No
intra or extrahepatic biliary ductal dilatation. The hepatic and
portal veins are patent.

Pancreas: Stranding about the pancreatic head and adjacent duodenum
(series 6, image 49). No pancreatic ductal dilatation.

Spleen: Normal in size without focal abnormality.

Adrenals/Urinary Tract: The adrenal glands are unremarkable. The
kidneys enhance symmetrically with no hydronephrosis. The bladder is
unremarkable for degree of distension.

Stomach/Bowel: Stomach is within normal limits. Appendix appears
normal. Stranding about the duodenum (series 6, image 50), which is
not distended or thickened. No other evidence of bowel wall
thickening, distention, or inflammatory changes. Diverticulosis
without evidence of diverticulitis.

Vascular/Lymphatic: No significant vascular findings are present. No
enlarged abdominal or pelvic lymph nodes.

Reproductive: Prostate is unremarkable.

Other: No abdominal wall hernia or abnormality. No abdominopelvic
ascites.

Musculoskeletal: No acute or significant osseous findings.
IMPRESSION: 1. Stranding about the pancreatic head and adjacent duodenum,
concerning for mild pancreatitis. Correlate with lipase.
2. No evidence of bowel obstruction.

ADDENDUM:
Absence of contrast in the portal vein, which appears mildly
enlarged with surrounding stranding, with contrast noted in the SMV,
splenic vein, and hepatic veins. This is concerning for portal vein
thrombus.

This could be further evaluated with ultrasound, delayed or
multiphase liver CT, or MRI with contrast.

These results were called by telephone at the time of interpretation
on 03/18/2021 at [DATE] to provider NAILOW CASISNO , who verbally
acknowledged these results.

*** End of Addendum ***
RADIATION DOSE REDUCTION: This exam was performed according to the
departmental dose-optimization program which includes automated
exposure control, adjustment of the mA and/or kV according to
patient size and/or use of iterative reconstruction technique.

CONTRAST:  100mL OMNIPAQUE IOHEXOL 300 MG/ML  SOLN
FINDINGS: Lower chest: No acute abnormality.

Hepatobiliary: Normal liver contours. Multiple low-density
subcentimeter lesions, most likely hepatic cysts but too small to
characterize, unchanged from the prior exam. No gallbladder wall
thickening, pericholecystic fluid, or significant cholelithiasis. No
intra or extrahepatic biliary ductal dilatation. The hepatic and
portal veins are patent.

Pancreas: Stranding about the pancreatic head and adjacent duodenum
(series 6, image 49). No pancreatic ductal dilatation.

Spleen: Normal in size without focal abnormality.

Adrenals/Urinary Tract: The adrenal glands are unremarkable. The
kidneys enhance symmetrically with no hydronephrosis. The bladder is
unremarkable for degree of distension.

Stomach/Bowel: Stomach is within normal limits. Appendix appears
normal. Stranding about the duodenum (series 6, image 50), which is
not distended or thickened. No other evidence of bowel wall
thickening, distention, or inflammatory changes. Diverticulosis
without evidence of diverticulitis.

Vascular/Lymphatic: No significant vascular findings are present. No
enlarged abdominal or pelvic lymph nodes.

Reproductive: Prostate is unremarkable.

Other: No abdominal wall hernia or abnormality. No abdominopelvic
ascites.

Musculoskeletal: No acute or significant osseous findings.
IMPRESSION: 1. Stranding about the pancreatic head and adjacent duodenum,
concerning for mild pancreatitis. Correlate with lipase.
2. No evidence of bowel obstruction.

## 2023-04-04 IMAGING — US US HEPATIC LIVER DOPPLER
2 series · 13 of 25 positions shown · non-contrast
Comparison: 03/18/2021

CLINICAL DATA: Portal vein thrombosis on earlier CT, abdominal pain

EXAM:
DUPLEX ULTRASOUND OF LIVER
TECHNIQUE: Color and duplex Doppler ultrasound was performed to evaluate the
hepatic in-flow and out-flow vessels.

[Series 1: us liver doppler · 12 of 82 slices shown]
[im 1/82]
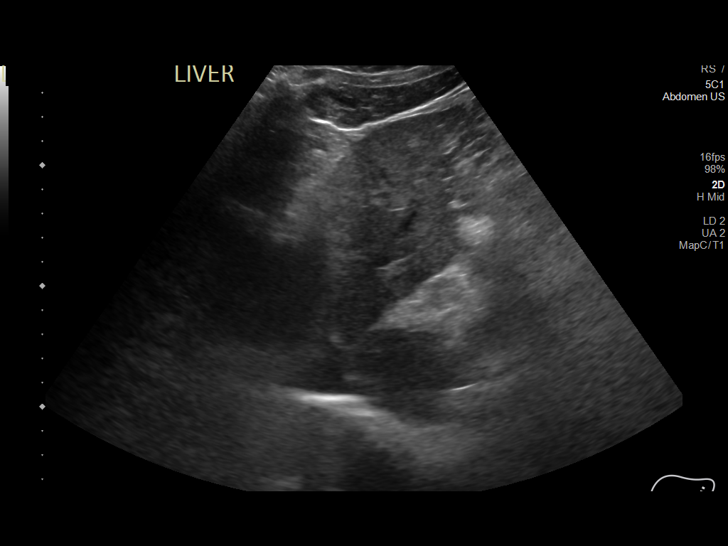
[im 8/82]
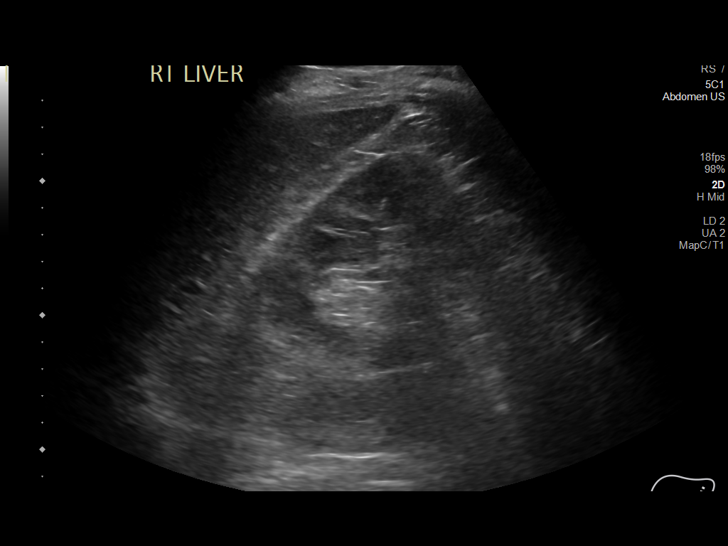
[im 15/82]
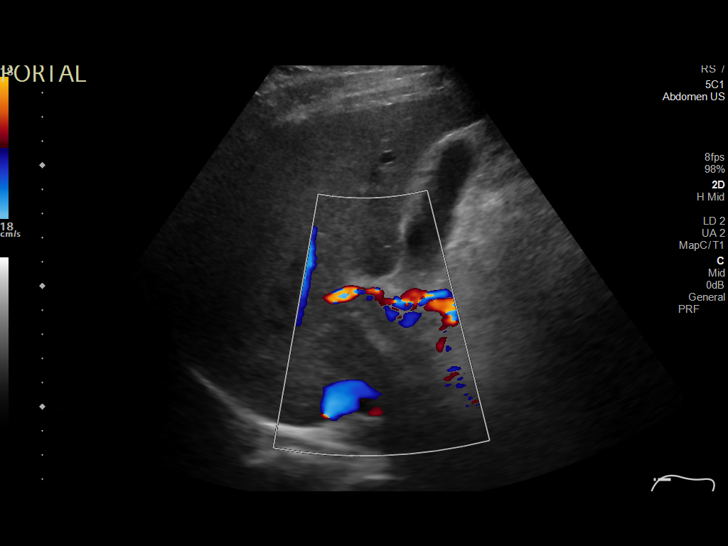
[im 22/82]
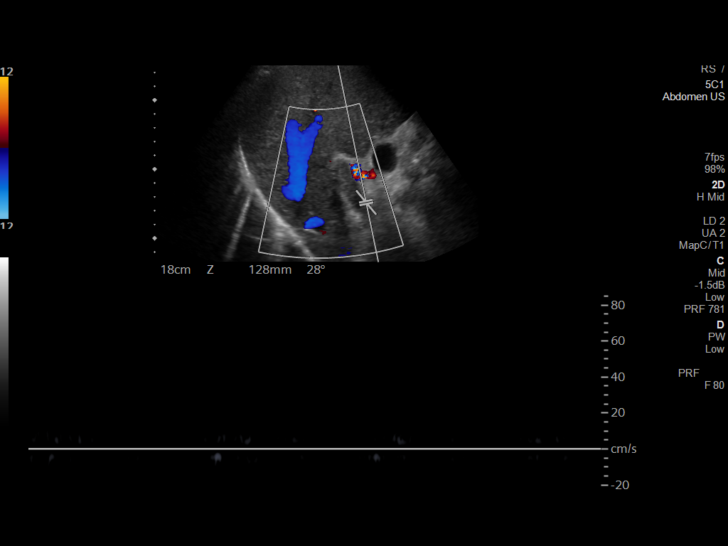
[im 29/82]
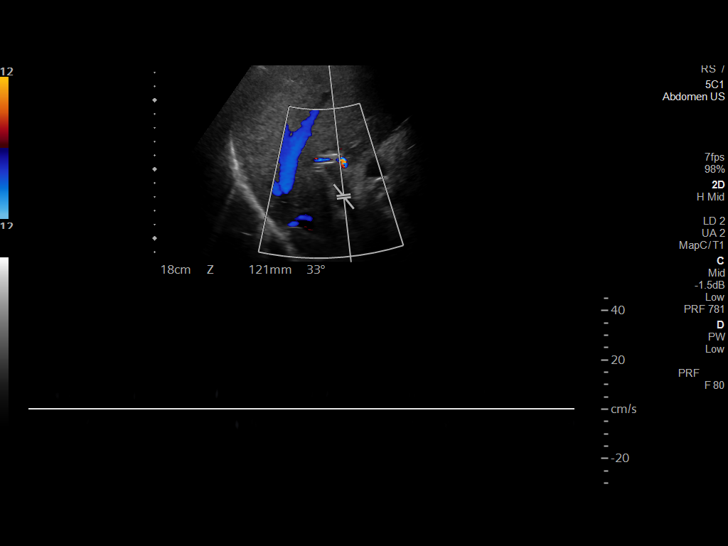
[im 36/82]
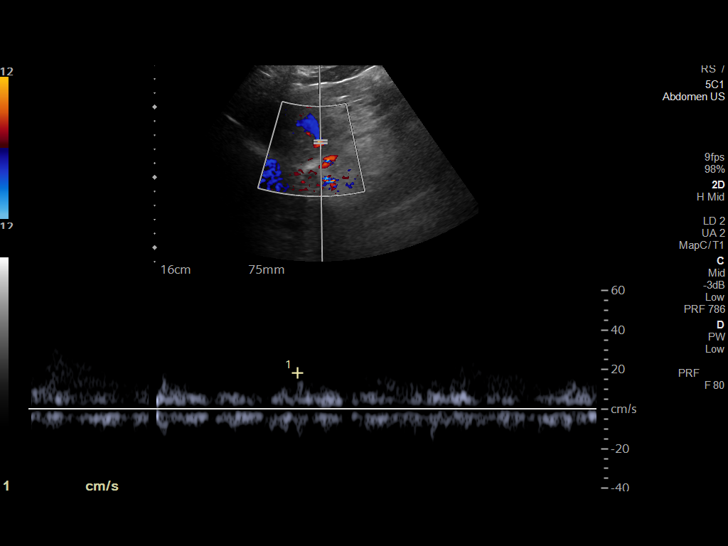
[im 43/82]
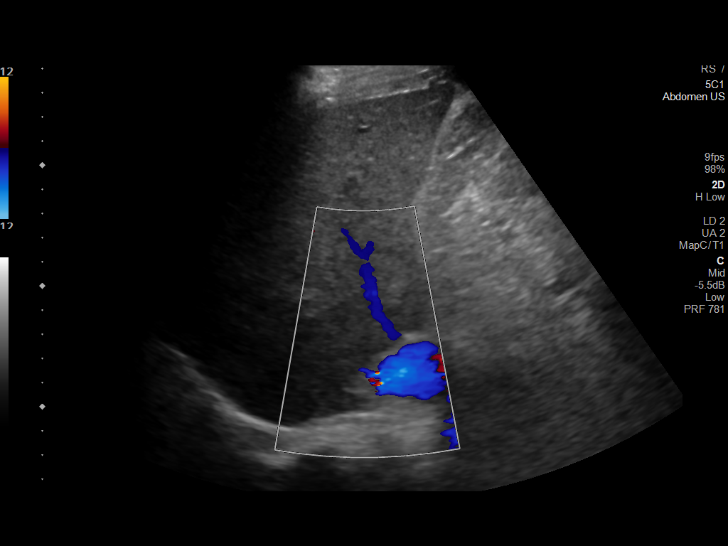
[im 50/82]
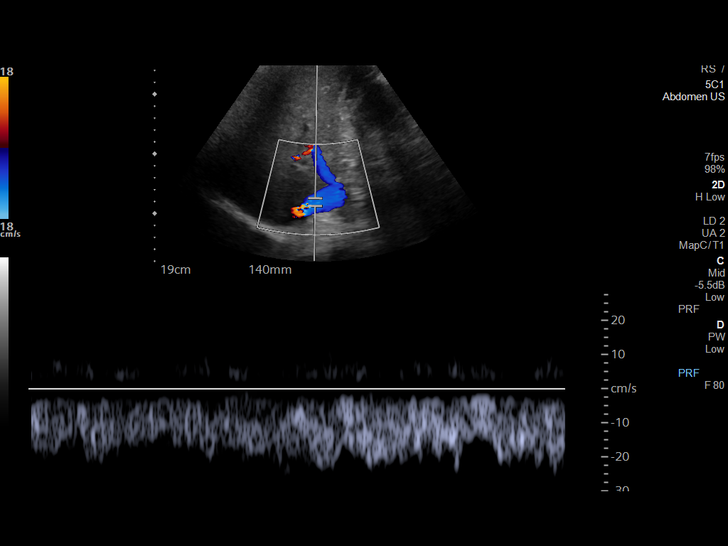
[im 57/82]
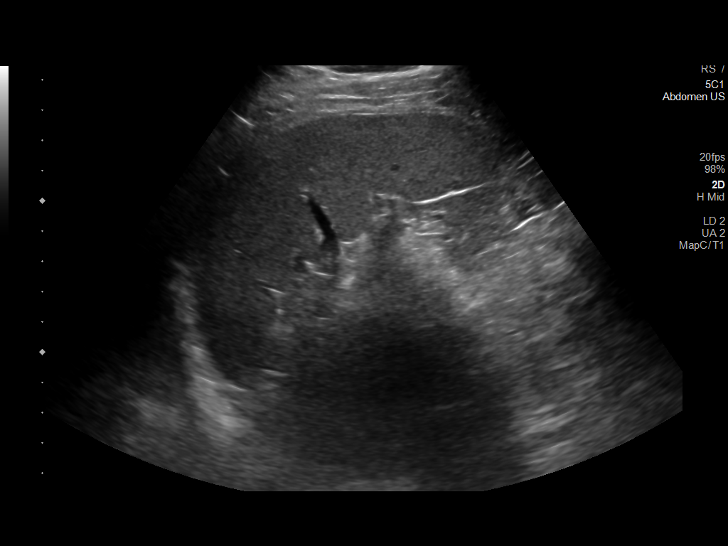
[im 64/82]
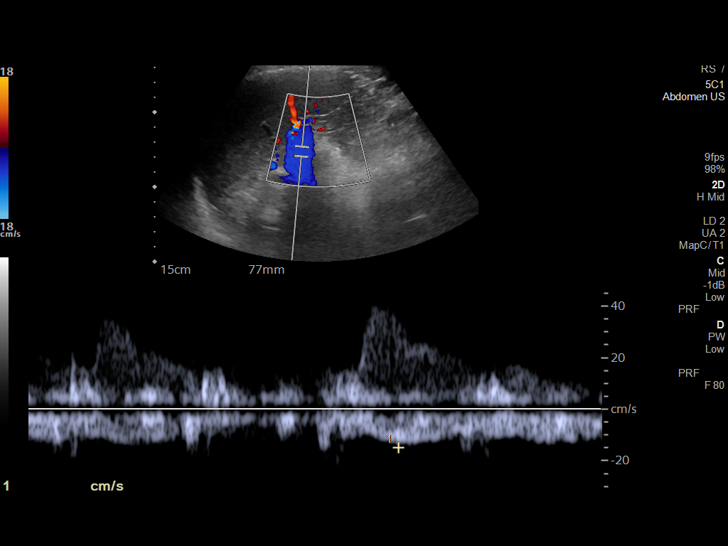
[im 71/82]
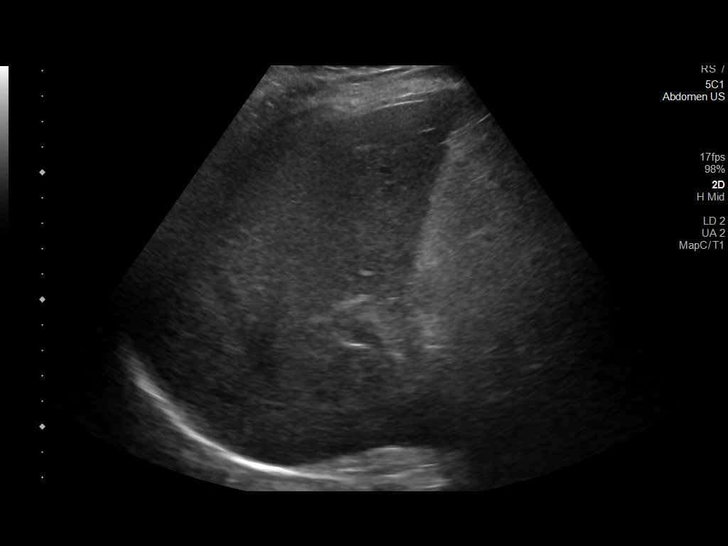
[im 78/82]
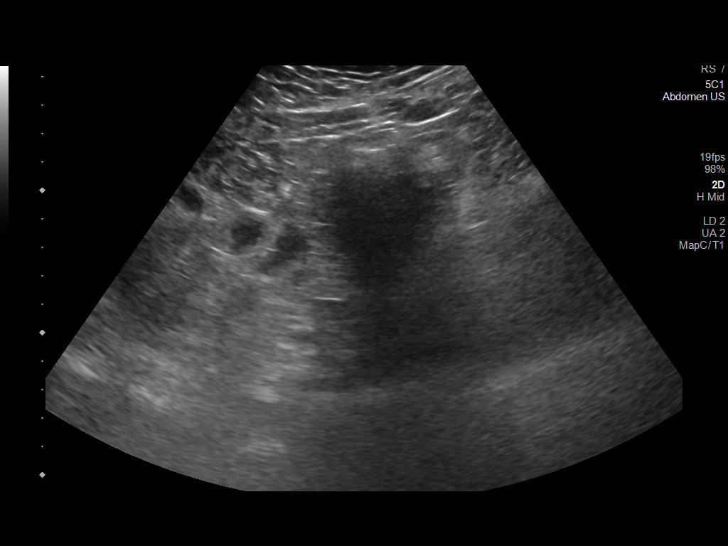

[Series 1001: abdomen us · 1 of 3 slices shown]
[im 1/3]
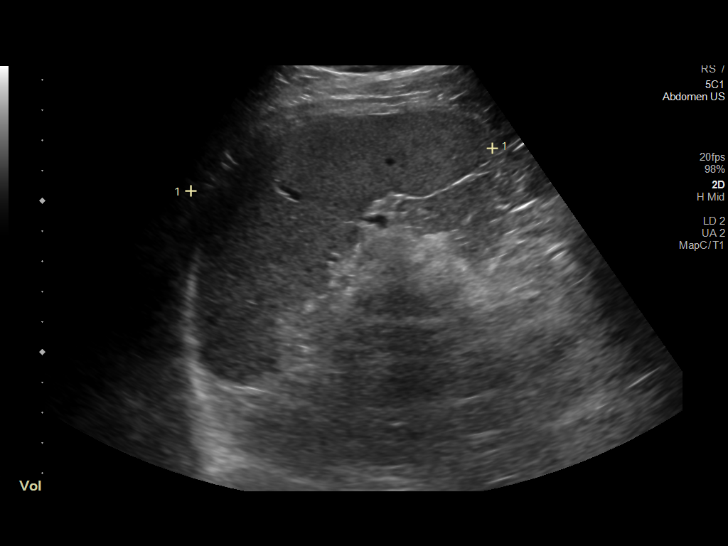

[13 of 25 positions shown; findings below may reference images not displayed]

FINDINGS: Liver: Heterogeneous increased liver echotexture without focal
abnormality. Normal hepatic contour without nodularity.

No focal lesion, mass or intrahepatic biliary ductal dilatation.

Main Portal Vein size: 1.2 cm

Portal Vein Velocities

Main Prox:  Occluded cm/sec

Main Mid: Occluded cm/sec

Main Dist:  Occluded cm/sec
Right: Occluded cm/sec
Left: 15 cm/sec, vessel is diminutive with turbulent flow,
consistent with partial thrombosis.

Hepatic Vein Velocities

Right:  19 cm/sec

Middle:  24 cm/sec

Left: Not visualized.

IVC: Present and patent with normal respiratory phasicity.

Hepatic Artery Velocity:  103 cm/sec

Splenic Vein Velocity:  15 cm/sec

Spleen: 10.1 cm x 13.4 cm x 5.1 cm with a total volume of 357 cm^3
(411 cm^3 is upper limit normal)

Portal Vein Occlusion/Thrombus: Yes

Splenic Vein Occlusion/Thrombus: No

Ascites: None

Varices: None
IMPRESSION: 1. Portal vein thrombosis, likely acute based on portal vein
enlargement and associated fat stranding seen on CT.
2. Heterogeneous increased liver echotexture consistent with
intrinsic liver disease.

## 2023-04-05 ENCOUNTER — Telehealth: Payer: Self-pay | Admitting: Family Medicine

## 2023-04-05 ENCOUNTER — Encounter: Payer: Self-pay | Admitting: Physical Therapy

## 2023-04-05 ENCOUNTER — Ambulatory Visit: Payer: BC Managed Care – PPO | Admitting: Physical Therapy

## 2023-04-05 DIAGNOSIS — M6281 Muscle weakness (generalized): Secondary | ICD-10-CM | POA: Diagnosis not present

## 2023-04-05 DIAGNOSIS — R262 Difficulty in walking, not elsewhere classified: Secondary | ICD-10-CM

## 2023-04-05 DIAGNOSIS — M5459 Other low back pain: Secondary | ICD-10-CM

## 2023-04-05 DIAGNOSIS — M5416 Radiculopathy, lumbar region: Secondary | ICD-10-CM

## 2023-04-05 NOTE — Therapy (Addendum)
 OUTPATIENT PHYSICAL THERAPY THORACOLUMBAR  DISCHARGE  Patient Name: Nathan Rowland MRN: [id number] DOB:1980-09-03, 43 y.o., male Today's Date: 04/05/2023  END OF SESSION:  PT End of Session - 04/05/23 1559     Visit Number 4    Number of Visits 10    PT Start Time 1518    PT Stop Time 1557    PT Time Calculation (min) 39 min    Activity Tolerance Patient tolerated treatment well;Patient limited by pain    Behavior During Therapy [facility] for tasks assessed/performed                Past Medical History:  Diagnosis Date   Allergy 11/29/1996   Anemia    Blood transfusion without reported diagnosis    Clotting disorder (HCC)    Finger fracture    Kidney calculus    Left knee pain    dx'd with L retropatellar knee pain   Past Surgical History:  Procedure Laterality Date   COLONOSCOPY WITH PROPOFOL  N/A 07/14/2022   Procedure: COLONOSCOPY WITH PROPOFOL ;  Surgeon: Legrand Victory LITTIE DOUGLAS, MD;  Location: [facility] ENDOSCOPY;  Service: Gastroenterology;  Laterality: N/A;   ENTEROSCOPY N/A 07/12/2022   Procedure: ENTEROSCOPY;  Surgeon: Legrand Victory LITTIE DOUGLAS, MD;  Location: [facility] ENDOSCOPY;  Service: Gastroenterology;  Laterality: N/A;   GIVENS CAPSULE STUDY N/A 07/12/2022   Procedure: GIVENS CAPSULE STUDY;  Surgeon: Legrand Victory LITTIE DOUGLAS, MD;  Location: [facility] ENDOSCOPY;  Service: Gastroenterology;  Laterality: N/A;   IR EMBO ART  VEN HEMORR LYMPH EXTRAV  INC GUIDE ROADMAPPING  07/21/2022   IR RADIOLOGIST EVAL & MGMT  06/02/2022   IR TIPS  07/21/2022   IR US  GUIDE VASC ACCESS LEFT  07/21/2022   IR US  GUIDE VASC ACCESS RIGHT  07/21/2022   IR US  GUIDE VASC ACCESS RIGHT  07/21/2022   RADIOLOGY WITH ANESTHESIA N/A 07/21/2022   Procedure: TIPS;  Surgeon: Jennefer Ester PARAS, MD;  Location: [facility] OR;  Service: Radiology;  Laterality: N/A;   SUBMUCOSAL TATTOO INJECTION  07/12/2022   Procedure: SUBMUCOSAL TATTOO INJECTION;  Surgeon: Legrand Victory LITTIE DOUGLAS, MD;  Location: [facility] ENDOSCOPY;  Service: Gastroenterology;;    Patient Active Problem List   Diagnosis Date Noted   History of GI bleed 07/28/2022   Ileus (HCC) 07/28/2022   Pulmonary embolus (HCC) 07/28/2022   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   Chronic anticoagulation 07/28/2022   Abdominal pain 07/27/2022   Current use of long term anticoagulation 07/13/2022   Portal hypertension (HCC) 07/13/2022   Acute upper GI bleed 07/12/2022   Blood loss anemia 07/12/2022   Esophageal varices (HCC) 07/12/2022   Melena 07/12/2022   Acute hypoxic respiratory failure (HCC) 07/12/2022   Iron  deficiency anemia 07/05/2022   Back strain 05/26/2022   Kidney calculus 08/04/2021   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 Pt stating around Halloween 2024 his pain started to get worse.   SUBJECTIVE:  SUBJECTIVE STATEMENT: Pt stating traction lasted until he got in his truck following last visit. Pt stated he hasn't been able to perform his HEP like he wanted to over the last several days. Pt stating he is unable to sleep and he feels best when lying flat on the floor.   PERTINENT HISTORY:  Pt on blood thinner See history in chart above  PAIN:  NPRS scale 3-4/10  pain at rest, getting out of the truck 7/10, worse pain 8-9/10 Pain location: low back, Left LE Pain description: sharp, achy/dull Aggravating factors: transitions Relieving factors: standing  PRECAUTIONS: None  WEIGHT BEARING RESTRICTIONS: No  FALLS:  Has patient fallen in last 6 months? No  LIVING ENVIRONMENT: Lives with: lives with their family and lives with their spouse Lives in: House/apartment Stairs: yes Has following equipment at home: None  OCCUPATION: Nurse, mental health  PLOF: Independent  PATIENT GOALS: Be able to perform sit to stand and toilet without pain  Next MD Visit: no follow up scheduled at present time   OBJECTIVE:   DIAGNOSTIC FINDINGS:  03/03/23 FINDINGS: There is no evidence of lumbar spine fracture. Alignment is normal. Intervertebral disc spaces are maintained.   IMPRESSION: Negative.  PATIENT SURVEYS:  03/15/23: FOTO eval:  46%   SCREENING FOR RED FLAGS: Bowel or bladder incontinence: No Cauda equina syndrome: No  COGNITION: Overall cognitive status: WFL normal      SENSATION: WFL    POSTURE:  No Significant postural limitations, rounded shoulders, and forward head  PALPATION: TTP: left lumbar paraspinals and left QL  LUMBAR ROM:   Directional Preference Assessment: Centralization: with extension    AROM 03/15/23  Flexion 65  Extension 15 pain noted  Right lateral flexion 32 pain noted  Left lateral flexion 30 pain noted  Right rotation WFL  Left rotation WFL   (Blank rows = not tested)  LOWER EXTREMITY ROM:      Right 03/15/23 Left 03/15/23  Hip flexion 105 100  Hip extension    Hip abduction    Hip adduction    Hip internal rotation    Hip external rotation    Knee flexion    Knee extension    Ankle dorsiflexion    Ankle plantarflexion    Ankle inversion    Ankle eversion     (Blank rows = not tested)  LOWER EXTREMITY MMT:    MMT Right 03/15/23 Left 03/15/23  Hip flexion 4 3  Hip extension 4 3   Hip abduction    Hip adduction    Hip internal rotation    Hip external rotation    Knee flexion  4  Knee extension  3  Ankle dorsiflexion    Ankle plantarflexion    Ankle inversion    Ankle eversion     (Blank rows = not tested)  LUMBAR SPECIAL TESTS:  03/15/23: positive SLR and positive slump test on the left  FUNCTIONAL TESTS:  Sit to stand from 18 inch chair c UE support: 5.6 seconds  GAIT: Clinic distances with antalgic gait pattern  TODAY'S TREATMENT:                                                                                                          DATE:  04/05/23:   TherEx:  Supine: abdominal sets with supine marching Supine: SLR 2 x 10 bil Trunk rotation figure 4 x 3 bil LEs holding 30 sec Hip adduction with ball between knees x 10 holding 10 sec Clam shells: 2 x 10 green TB  holding 5 sec Bridges: 2 x 10 c ball between knees Manual:  Left LE distraction beginning with ER and then progressing to IR x 6 holding 20-30 seconds   DATE:  03/28/23:   Lumbar Traction X 25 minutes, 120#-100# pull   DATE:  03/22/23:   TherEx:  Standing trunk extension x 10 holding 10 sec Reviewed all HEP verbally and added glute stretch pushing knee away to pt's home program Nustep attempted, but pt unable to tolerate due to increased pain.  Lumbar Traction X 20 minutes, 100#-80# pull    PATIENT EDUCATION:  Education details: HEP, POC Person educated: Patient Education method: Explanation, Demonstration, Verbal cues, and Handouts Education comprehension: verbalized understanding, returned demonstration, and verbal cues required  HOME EXERCISE PROGRAM: Access Code: [facility] URL: [email or web address] Date: 03/22/2023 Prepared by: Delon Lunger  Exercises - Supine Bridge  - 2 x daily - 7 x weekly - 2 sets - 10 reps - 5 seonds hold - Supine Lower Trunk Rotation  - 2 x daily - 7 x weekly - 3 reps - 20 seconds hold - Supine Piriformis Stretch with Foot on Ground  - 2 x daily - 7 x weekly - 3 reps - 20 seconds hold - Standing Lumbar Extension at Wall - Forearms  - 2 x daily - 7 x weekly - 5 reps - 10 seconds hold - Supine Figure 4 Piriformis Stretch  - 2 x daily - 7 x weekly - 3 reps - 20 seconds hold  ASSESSMENT:  CLINICAL IMPRESSION: Pt arriving to therapy reporting 3-4/10 in his low back. Pt also reporting he is scheduled for MRI tomorrow morning. Pt with good response to left LE distraction with IR of the hip. Continue skilled PT as pt tolerates.     OBJECTIVE IMPAIRMENTS: decreased activity tolerance, decreased mobility, difficulty walking, decreased ROM, decreased strength, impaired flexibility, and pain.   ACTIVITY LIMITATIONS: bending, sitting, standing, squatting, sleeping, transfers, and toileting  PARTICIPATION LIMITATIONS: community activity, occupation, and yard work  PERSONAL FACTORS: see pertinent history above are also affecting patient's functional outcome.   REHAB POTENTIAL: Good  CLINICAL DECISION MAKING: Evolving/moderate complexity  EVALUATION COMPLEXITY: Low   GOALS: Goals reviewed with patient? Yes  SHORT TERM GOALS: (target date for Short term goals are 3 weeks 04/05/2023)   1. Patient will demonstrate independent use of home exercise program to maintain progress from in clinic treatments.  Goal status: New  LONG TERM GOALS: (target dates for all long term goals are 10 weeks  05/24/2023 )   1. Patient will demonstrate/report pain at worst less than or  equal to 2/10 to facilitate minimal limitation in daily activity secondary to pain symptoms.  Goal status: on-going 03/22/23   2. Patient will demonstrate independent use of home exercise program to facilitate ability to maintain/progress  functional gains from skilled physical therapy services.  Goal status: New   3. Patient will demonstrate FOTO outcome > or = 63 % to indicate reduced disability due to condition.  Goal status: New   4. Patient will demonstrate lumbar extension 100 % WFL s symptoms to facilitate upright standing, walking posture at PLOF s limitation.  Goal status: New   5.  Pt will be able to perform sit to stand with no pain reported from 18 inch chair with no UE support.   Goal status: New   6.  Pt will be able to lift 20# from floor to counter height using recommended lifting techniques with back pain </= 2/10.  Goal status: New     PLAN:  PT FREQUENCY: 1-x/week  PT DURATION: 10 weeks  PLANNED INTERVENTIONS: Can include 02853- PT Re-evaluation, 97110-Therapeutic exercises, 97530- Therapeutic activity, [id number]- Neuromuscular re-education, 97535- Self Care, 97140- Manual therapy, [phone]- Gait training, [phone]- Orthotic Fit/training, [phone]- Canalith repositioning, [id number]- Aquatic Therapy, 97014- Electrical stimulation (unattended), [id number]- Electrical stimulation (manual), [id number]- Vasopneumatic device, [id number]- Ultrasound, [id number]- Traction (mechanical), [id number]- Ionotophoresis 4mg /ml Dexamethasone , Patient/Family education, Balance training, Stair training, Taping, Dry Needling, Joint mobilization, Joint manipulation, Spinal manipulation, Spinal mobilization, Scar mobilization, Vestibular training, Visual/preceptual remediation/compensation, DME instructions, Cryotherapy, and Moist heat.  All performed as medically necessary.  All included unless contraindicated  PLAN FOR NEXT SESSION: core strengthening, manual as needed extension based exercises  MRI scheduled on 04/05/22 at 6:30 am   PHYSICAL THERAPY DISCHARGE SUMMARY  Visits from Start of Care: 4  Current functional level related to goals / functional outcomes: See above   Remaining deficits: See above   Education / Equipment: HEP   Patient agrees  to discharge. Patient goals were not met. Patient is being discharged due to not returning since the last visit.   Delon JONELLE Lunger, PT, MPT 04/05/2023, 4:01 PM

## 2023-04-05 NOTE — Telephone Encounter (Signed)
 Copied from CRM [id number]. Topic: General - Other >> Apr 04, 2023  4:05 PM Macario HERO wrote: Reason for CRM: Bobbette from [facility] called regarding patient's appointment on February 6th, 2025 at 6:30 AM - Mr Lumbar Spine Wo Contrast-Gi. They need authorization from Dr. Jacques Copland - Must be completed by 04/05/2023 at noon- if not will have to reschedule patient appointment. Fax: [phone]

## 2023-04-06 ENCOUNTER — Ambulatory Visit
Admission: RE | Admit: 2023-04-06 | Discharge: 2023-04-06 | Disposition: A | Discharge status: Home / Self Care | Payer: BC Managed Care – PPO | Source: Ambulatory Visit | Attending: Family Medicine | Admitting: Family Medicine

## 2023-04-06 DIAGNOSIS — M5126 Other intervertebral disc displacement, lumbar region: Secondary | ICD-10-CM | POA: Diagnosis not present

## 2023-04-06 DIAGNOSIS — G8929 Other chronic pain: Secondary | ICD-10-CM

## 2023-04-07 ENCOUNTER — Encounter: Payer: Self-pay | Admitting: Physical Therapy

## 2023-04-07 MED ORDER — HYDROCODONE-ACETAMINOPHEN 5-325 MG PO TABS: 1.0000 | ORAL_TABLET | Freq: Four times a day (QID) | ORAL | 0 refills | Status: DC | PRN

## 2023-04-07 NOTE — Addendum Note (Signed)
 Addended by: Scherrie Curt on: 04/07/2023 02:02 PM   Modules accepted: Orders

## 2023-04-07 NOTE — Addendum Note (Signed)
 Addended by: Scherrie Curt on: 04/07/2023 01:57 PM   Modules accepted: Orders

## 2023-04-08 NOTE — H&P (View-Only) (Signed)
 Referring Physician:  Hannah Beat, MD [address] [place],  [place] [address]  Primary Physician:  Joaquim Nam, MD  History of Present Illness: 04/12/2023 [name] is here today with a chief complaint of left leg pain and weakness.  He has been having symptoms for more than 6 months with worsening over the past 6 weeks.  He is having numbness in his left leg.  He has weakness.  He has trouble with sitting and flexing.  His pain is as bad as 9 out of 10.  He cannot sleep well.  He has very minimal relief with changing positions and lying on the floor.  He tried physical therapy but his symptoms have worsened.  He cannot tolerate further physical therapy.  He has tried multiple medications without improvement including NSAIDs for more than 6 weeks.  Bowel/Bladder Dysfunction: none  Conservative measures:  Physical therapy: has participated in PT at [facility], made the pain worse, not been discharged Multimodal medical therapy including regular antiinflammatories:  Prednisone, Tizanidine, hydrocodone Injections: no epidural steroid injections  Past Surgery: none  Nathan Rowland has no symptoms of cervical myelopathy  The symptoms are causing a significant impact on the patient's life.   I have utilized the care everywhere function in epic to review the outside records available from external health systems.  Review of Systems:  A 10 point review of systems is negative, except for the pertinent positives and negatives detailed in the HPI.  Past Medical History: Past Medical History:  Diagnosis Date   Allergy 11/29/1996   Anemia    Blood transfusion without reported diagnosis    Clotting disorder (HCC)    Finger fracture    Kidney calculus    Left knee pain    dx'd with L retropatellar knee pain    Past Surgical History: Past Surgical History:  Procedure Laterality Date   COLONOSCOPY WITH PROPOFOL N/A 07/14/2022   Procedure: COLONOSCOPY  WITH PROPOFOL;  Surgeon: Sherrilyn Rist, MD;  Location: [facility] ENDOSCOPY;  Service: Gastroenterology;  Laterality: N/A;   ENTEROSCOPY N/A 07/12/2022   Procedure: ENTEROSCOPY;  Surgeon: Sherrilyn Rist, MD;  Location: [facility] ENDOSCOPY;  Service: Gastroenterology;  Laterality: N/A;   GIVENS CAPSULE STUDY N/A 07/12/2022   Procedure: GIVENS CAPSULE STUDY;  Surgeon: Sherrilyn Rist, MD;  Location: [facility] ENDOSCOPY;  Service: Gastroenterology;  Laterality: N/A;   IR EMBO ART  VEN HEMORR LYMPH EXTRAV  INC GUIDE ROADMAPPING  07/21/2022   IR RADIOLOGIST EVAL & MGMT  06/02/2022   IR TIPS  07/21/2022   IR US GUIDE VASC ACCESS LEFT  07/21/2022   IR US GUIDE VASC ACCESS RIGHT  07/21/2022   IR US GUIDE VASC ACCESS RIGHT  07/21/2022   RADIOLOGY WITH ANESTHESIA N/A 07/21/2022   Procedure: TIPS;  Surgeon: Bennie Dallas, MD;  Location: [facility] OR;  Service: Radiology;  Laterality: N/A;   SUBMUCOSAL TATTOO INJECTION  07/12/2022   Procedure: SUBMUCOSAL TATTOO INJECTION;  Surgeon: Sherrilyn Rist, MD;  Location: [facility] ENDOSCOPY;  Service: Gastroenterology;;    Allergies: Allergies as of 04/12/2023 - Review Complete 04/12/2023  Allergen Reaction Noted   Pollen extract  03/27/2021    Medications:  Current Outpatient Medications:    apixaban (ELIQUIS) 5 MG TABS tablet, Take 1 tablet (5 mg total) by mouth 2 (two) times daily., Disp: 180 tablet, Rfl: 3   EPINEPHrine 0.3 mg/0.3 mL IJ SOAJ injection, Inject 0.3 mg into the muscle  once as needed for anaphylaxis., Disp: , Rfl:    HYDROcodone-acetaminophen (NORCO/VICODIN) 5-325 MG tablet, Take 1 tablet by mouth every 6 (six) hours as needed for moderate pain (pain score 4-6) or severe pain (pain score 7-10)., Disp: 20 tablet, Rfl: 0   tiZANidine (ZANAFLEX) 4 MG tablet, Take 1 tablet (4 mg total) by mouth at bedtime as needed for muscle spasms., Disp: 30 tablet, Rfl: 2  Social History: Social History   Tobacco Use   Smoking status: Never    Passive exposure: Past    Smokeless tobacco: Never  Substance Use Topics   Alcohol use: Yes    Comment: occ   Drug use: No    Family Medical History: Family History  Problem Relation Age of Onset   Diabetes Other        3 great aunts, adult onset   Cancer Other        Uterine   Cancer Mother    Varicose Veins Mother    Heart disease Maternal Grandmother        MI   Stroke Maternal Grandmother    Cancer Maternal Grandfather        Lung   Cancer Paternal Grandmother        Skin   Emphysema Paternal Grandfather    Cancer Paternal Grandfather    Cancer Paternal Uncle    Cancer Paternal Aunt    Hypertension Neg Hx    Depression Neg Hx    Alcohol abuse Neg Hx    Drug abuse Neg Hx    Prostate cancer Neg Hx    Colon cancer Neg Hx     Physical Examination: Vitals:   04/12/23 0826  BP: 128/82    General: Patient is in no apparent distress. Attention to examination is appropriate.  Neck:   Supple.  Full range of motion.  Respiratory: Patient is breathing without any difficulty.   NEUROLOGICAL:     Awake, alert, oriented to person, place, and time.  Speech is clear and fluent.   Cranial Nerves: Pupils equal round and reactive to light.  Facial tone is symmetric.  Facial sensation is symmetric. Shoulder shrug is symmetric. Tongue protrusion is midline.  There is no pronator drift.  Strength: Side Biceps Triceps Deltoid Interossei Grip Wrist Ext. Wrist Flex.  R 5 5 5 5 5 5 5   L 5 5 5 5 5 5 5    Side Iliopsoas Quads Hamstring PF DF EHL  R 5 5 5 5 5 5   L 5 5 5 3 5 4    Reflexes are 1+ and symmetric at the biceps, triceps, brachioradialis, patella and achilles.   Hoffman's is absent.   Bilateral upper and lower extremity sensation is intact to light touch.    No evidence of dysmetria noted.  Gait is antalgic.  Straight leg raise is positive at 30 degrees on the left and negative on the right.     Medical Decision Making  Imaging: MRI L spine 04/06/2023 IMPRESSION: 1. L5-S1 left  subarticular protrusion narrows the left lateral recess and contacts the descending left S1 nerve roots. 2. No spinal canal stenosis or neural foraminal narrowing. 3. Mild edema between the spinous processes at L3-L4, L4-L5, and L5-S1, as can be seen with Baastrup's disease.     Electronically Signed   By: Wiliam Ke M.D.   On: 04/07/2023 13:18  I have personally reviewed the images and agree with the above interpretation.  Assessment and Plan: [name] is a pleasant 43  y.o. 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  He has objective weakness.  He has had symptoms for more than 6 months.  He has tried physical therapy which is worsening his current condition.  Given his worsening symptoms as well as objective weakness, no further conservative management is indicated.  Have recommended left-sided L5-S1 microdiscectomy.  I discussed the planned procedure at length with the patient, including the risks, benefits, alternatives, and indications. The risks discussed include but are not limited to bleeding, infection, need for reoperation, spinal fluid leak, stroke, vision loss, anesthetic complication, coma, paralysis, and even death. I also described in detail that improvement was not guaranteed.  The patient expressed understanding of these risks, and asked that we proceed with surgery. I described the surgery in layman's terms, and gave ample opportunity for questions, which were answered to the best of my ability.  I spent a total of 30 minutes in this patient's care today. This time was spent reviewing pertinent records including imaging studies, obtaining and confirming history, performing a directed evaluation, formulating and discussing my recommendations, and documenting the visit within the medical record.     Thank you for involving me in the care of this patient.      Kalel Harty K. Myer Haff MD, [facility] Neurosurgery

## 2023-04-08 NOTE — Progress Notes (Signed)
Referring Physician:  Hannah Beat, MD [address] [place],  [place] [address]  Primary Physician:  Joaquim Nam, MD  History of Present Illness: 04/12/2023 [name] is here today with a chief complaint of left leg pain and weakness.  He has been having symptoms for more than 6 months with worsening over the past 6 weeks.  He is having numbness in his left leg.  He has weakness.  He has trouble with sitting and flexing.  His pain is as bad as 9 out of 10.  He cannot sleep well.  He has very minimal relief with changing positions and lying on the floor.  He tried physical therapy but his symptoms have worsened.  He cannot tolerate further physical therapy.  He has tried multiple medications without improvement including NSAIDs for more than 6 weeks.  Bowel/Bladder Dysfunction: none  Conservative measures:  Physical therapy: has participated in PT at [facility], made the pain worse, not been discharged Multimodal medical therapy including regular antiinflammatories:  Prednisone, Tizanidine, hydrocodone Injections: no epidural steroid injections  Past Surgery: none  Nathan Rowland has no symptoms of cervical myelopathy  The symptoms are causing a significant impact on the patient's life.   I have utilized the care everywhere function in epic to review the outside records available from external health systems.  Review of Systems:  A 10 point review of systems is negative, except for the pertinent positives and negatives detailed in the HPI.  Past Medical History: Past Medical History:  Diagnosis Date   Allergy 11/29/1996   Anemia    Blood transfusion without reported diagnosis    Clotting disorder (HCC)    Finger fracture    Kidney calculus    Left knee pain    dx'd with L retropatellar knee pain    Past Surgical History: Past Surgical History:  Procedure Laterality Date   COLONOSCOPY WITH PROPOFOL N/A 07/14/2022   Procedure: COLONOSCOPY  WITH PROPOFOL;  Surgeon: Sherrilyn Rist, MD;  Location: [facility] ENDOSCOPY;  Service: Gastroenterology;  Laterality: N/A;   ENTEROSCOPY N/A 07/12/2022   Procedure: ENTEROSCOPY;  Surgeon: Sherrilyn Rist, MD;  Location: [facility] ENDOSCOPY;  Service: Gastroenterology;  Laterality: N/A;   GIVENS CAPSULE STUDY N/A 07/12/2022   Procedure: GIVENS CAPSULE STUDY;  Surgeon: Sherrilyn Rist, MD;  Location: [facility] ENDOSCOPY;  Service: Gastroenterology;  Laterality: N/A;   IR EMBO ART  VEN HEMORR LYMPH EXTRAV  INC GUIDE ROADMAPPING  07/21/2022   IR RADIOLOGIST EVAL & MGMT  06/02/2022   IR TIPS  07/21/2022   IR US GUIDE VASC ACCESS LEFT  07/21/2022   IR US GUIDE VASC ACCESS RIGHT  07/21/2022   IR US GUIDE VASC ACCESS RIGHT  07/21/2022   RADIOLOGY WITH ANESTHESIA N/A 07/21/2022   Procedure: TIPS;  Surgeon: Bennie Dallas, MD;  Location: [facility] OR;  Service: Radiology;  Laterality: N/A;   SUBMUCOSAL TATTOO INJECTION  07/12/2022   Procedure: SUBMUCOSAL TATTOO INJECTION;  Surgeon: Sherrilyn Rist, MD;  Location: [facility] ENDOSCOPY;  Service: Gastroenterology;;    Allergies: Allergies as of 04/12/2023 - Review Complete 04/12/2023  Allergen Reaction Noted   Pollen extract  03/27/2021    Medications:  Current Outpatient Medications:    apixaban (ELIQUIS) 5 MG TABS tablet, Take 1 tablet (5 mg total) by mouth 2 (two) times daily., Disp: 180 tablet, Rfl: 3   EPINEPHrine 0.3 mg/0.3 mL IJ SOAJ injection, Inject 0.3 mg into the muscle  once as needed for anaphylaxis., Disp: , Rfl:    HYDROcodone-acetaminophen (NORCO/VICODIN) 5-325 MG tablet, Take 1 tablet by mouth every 6 (six) hours as needed for moderate pain (pain score 4-6) or severe pain (pain score 7-10)., Disp: 20 tablet, Rfl: 0   tiZANidine (ZANAFLEX) 4 MG tablet, Take 1 tablet (4 mg total) by mouth at bedtime as needed for muscle spasms., Disp: 30 tablet, Rfl: 2  Social History: Social History   Tobacco Use   Smoking status: Never    Passive exposure: Past    Smokeless tobacco: Never  Substance Use Topics   Alcohol use: Yes    Comment: occ   Drug use: No    Family Medical History: Family History  Problem Relation Age of Onset   Diabetes Other        3 great aunts, adult onset   Cancer Other        Uterine   Cancer Mother    Varicose Veins Mother    Heart disease Maternal Grandmother        MI   Stroke Maternal Grandmother    Cancer Maternal Grandfather        Lung   Cancer Paternal Grandmother        Skin   Emphysema Paternal Grandfather    Cancer Paternal Grandfather    Cancer Paternal Uncle    Cancer Paternal Aunt    Hypertension Neg Hx    Depression Neg Hx    Alcohol abuse Neg Hx    Drug abuse Neg Hx    Prostate cancer Neg Hx    Colon cancer Neg Hx     Physical Examination: Vitals:   04/12/23 0826  BP: 128/82    General: Patient is in no apparent distress. Attention to examination is appropriate.  Neck:   Supple.  Full range of motion.  Respiratory: Patient is breathing without any difficulty.   NEUROLOGICAL:     Awake, alert, oriented to person, place, and time.  Speech is clear and fluent.   Cranial Nerves: Pupils equal round and reactive to light.  Facial tone is symmetric.  Facial sensation is symmetric. Shoulder shrug is symmetric. Tongue protrusion is midline.  There is no pronator drift.  Strength: Side Biceps Triceps Deltoid Interossei Grip Wrist Ext. Wrist Flex.  R 5 5 5 5 5 5 5   L 5 5 5 5 5 5 5    Side Iliopsoas Quads Hamstring PF DF EHL  R 5 5 5 5 5 5   L 5 5 5 3 5 4    Reflexes are 1+ and symmetric at the biceps, triceps, brachioradialis, patella and achilles.   Hoffman's is absent.   Bilateral upper and lower extremity sensation is intact to light touch.    No evidence of dysmetria noted.  Gait is antalgic.  Straight leg raise is positive at 30 degrees on the left and negative on the right.     Medical Decision Making  Imaging: MRI L spine 04/06/2023 IMPRESSION: 1. L5-S1 left  subarticular protrusion narrows the left lateral recess and contacts the descending left S1 nerve roots. 2. No spinal canal stenosis or neural foraminal narrowing. 3. Mild edema between the spinous processes at L3-L4, L4-L5, and L5-S1, as can be seen with Baastrup's disease.     Electronically Signed   By: Wiliam Ke M.D.   On: 04/07/2023 13:18  I have personally reviewed the images and agree with the above interpretation.  Assessment and Plan: Nathan Rowland is a pleasant 43  y.o. 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  He has objective weakness.  He has had symptoms for more than 6 months.  He has tried physical therapy which is worsening his current condition.  Given his worsening symptoms as well as objective weakness, no further conservative management is indicated.  Have recommended left-sided L5-S1 microdiscectomy.  I discussed the planned procedure at length with the patient, including the risks, benefits, alternatives, and indications. The risks discussed include but are not limited to bleeding, infection, need for reoperation, spinal fluid leak, stroke, vision loss, anesthetic complication, coma, paralysis, and even death. I also described in detail that improvement was not guaranteed.  The patient expressed understanding of these risks, and asked that we proceed with surgery. I described the surgery in layman's terms, and gave ample opportunity for questions, which were answered to the best of my ability.  I spent a total of 30 minutes in this patient's care today. This time was spent reviewing pertinent records including imaging studies, obtaining and confirming history, performing a directed evaluation, formulating and discussing my recommendations, and documenting the visit within the medical record.     Thank you for involving me in the care of this patient.      Kalel Harty K. Myer Haff MD, [facility] Neurosurgery

## 2023-04-11 ENCOUNTER — Encounter: Payer: BC Managed Care – PPO | Admitting: Physical Therapy

## 2023-04-12 ENCOUNTER — Encounter: Payer: Self-pay | Admitting: Urgent Care

## 2023-04-12 ENCOUNTER — Ambulatory Visit: Payer: BC Managed Care – PPO | Admitting: Neurosurgery

## 2023-04-12 ENCOUNTER — Other Ambulatory Visit: Payer: Self-pay

## 2023-04-12 ENCOUNTER — Encounter
Admission: RE | Admit: 2023-04-12 | Discharge: 2023-04-12 | Disposition: A | Discharge status: Home / Self Care | Payer: BC Managed Care – PPO | Source: Ambulatory Visit | Attending: Neurosurgery | Admitting: Neurosurgery

## 2023-04-12 ENCOUNTER — Telehealth: Payer: Self-pay | Admitting: Family Medicine

## 2023-04-12 VITALS — BP 128/82 | Ht 72.0 in | Wt 246.0 lb

## 2023-04-12 DIAGNOSIS — Z01812 Encounter for preprocedural laboratory examination: Secondary | ICD-10-CM | POA: Insufficient documentation

## 2023-04-12 DIAGNOSIS — M5416 Radiculopathy, lumbar region: Secondary | ICD-10-CM

## 2023-04-12 DIAGNOSIS — Z01810 Encounter for preprocedural cardiovascular examination: Secondary | ICD-10-CM | POA: Diagnosis not present

## 2023-04-12 DIAGNOSIS — Z01818 Encounter for other preprocedural examination: Secondary | ICD-10-CM

## 2023-04-12 DIAGNOSIS — Z7901 Long term (current) use of anticoagulants: Secondary | ICD-10-CM

## 2023-04-12 DIAGNOSIS — R29898 Other symptoms and signs involving the musculoskeletal system: Secondary | ICD-10-CM

## 2023-04-12 DIAGNOSIS — M5117 Intervertebral disc disorders with radiculopathy, lumbosacral region: Secondary | ICD-10-CM

## 2023-04-12 LAB — URINALYSIS, COMPLETE (UACMP) WITH MICROSCOPIC
Bilirubin Urine: NEGATIVE
Glucose, UA: NEGATIVE mg/dL
Ketones, ur: NEGATIVE mg/dL
Leukocytes,Ua: NEGATIVE
Nitrite: NEGATIVE
Protein, ur: NEGATIVE mg/dL
Specific Gravity, Urine: 1.03 (ref 1.005–1.030)
Squamous Epithelial / HPF: 0 /[HPF] (ref 0–5)
pH: 5 (ref 5.0–8.0)

## 2023-04-12 LAB — CBC
HCT: 43.1 % (ref 39.0–52.0)
Hemoglobin: 14.9 g/dL (ref 13.0–17.0)
MCH: 30.2 pg (ref 26.0–34.0)
MCHC: 34.6 g/dL (ref 30.0–36.0)
MCV: 87.2 fL (ref 80.0–100.0)
Platelets: 169 10*3/uL (ref 150–400)
RBC: 4.94 MIL/uL (ref 4.22–5.81)
RDW: 13.1 % (ref 11.5–15.5)
WBC: 5.7 10*3/uL (ref 4.0–10.5)
nRBC: 0 % (ref 0.0–0.2)

## 2023-04-12 LAB — TYPE AND SCREEN
ABO/RH(D): A POS
Antibody Screen: NEGATIVE

## 2023-04-12 LAB — BASIC METABOLIC PANEL
Anion gap: 9 (ref 5–15)
BUN: 11 mg/dL (ref 6–20)
CO2: 24 mmol/L (ref 22–32)
Calcium: 8.5 mg/dL — ABNORMAL LOW (ref 8.9–10.3)
Chloride: 104 mmol/L (ref 98–111)
Creatinine, Ser: 0.97 mg/dL (ref 0.61–1.24)
GFR, Estimated: >60 mL/min (ref >60)
Glucose, Bld: 88 mg/dL (ref 70–99)
Potassium: 3.7 mmol/L (ref 3.5–5.1)
Sodium: 137 mmol/L (ref 135–145)

## 2023-04-12 LAB — SURGICAL PCR SCREEN
MRSA, PCR: NEGATIVE
Staphylococcus aureus: NEGATIVE

## 2023-04-12 NOTE — Addendum Note (Signed)
Addended by: Sharlot Gowda on: 04/12/2023 09:27 AM   Modules accepted: Orders

## 2023-04-12 NOTE — Patient Instructions (Signed)
Please see below for information in regards to your upcoming surgery:   Planned surgery: Left L5-S1 microdiscectomy   Surgery date: 04/18/23 at [facility] (Medical Mall: [address] [place], [place] [address]) - you will find out your arrival time the business day before your surgery.   Pre-op appointment at [facility] Pre-admit Testing: we will call you with a date/time for this. If you are scheduled for an in person appointment, Pre-admit Testing is located on the first floor of the [facility] building, [address], [address]. Please bring all prescriptions in the original prescription bottles to your appointment. During this appointment, they will advise you which medications you can take the morning of surgery, and which medications you will need to hold for surgery. Labs (such as blood work, EKG) may be done at your pre-op appointment. You are not required to fast for these labs. Should you need to change your pre-op appointment, please call Pre-admit testing at [phone].    Blood thinners:   Eliquis:   stop Eliquis 3 days prior, resume Eliquis 14 days after     Surgical clearance: we will send a clearance form to Dr Para March. They may wish to see you in their office prior to signing the clearance form. If so, they may call you to schedule an appointment.   Common restrictions after surgery: No bending, lifting, or twisting ("BLT"). Avoid lifting objects heavier than 10 pounds for the first 6 weeks after surgery. Where possible, avoid household activities that involve lifting, bending, reaching, pushing, or pulling such as laundry, vacuuming, grocery shopping, and childcare. Try to arrange for help from friends and family for these activities while you heal. Do not drive while taking prescription pain medication. Weeks 6 through 12 after surgery: avoid lifting more than 25 pounds.    How to contact us:  If you have any questions/concerns  before or after surgery, you can reach [place] at [phone], or you can send a mychart message. We can be reached by phone or mychart 8am-4pm, Monday-Friday.  *Please note: Calls after 4pm are forwarded to a third party answering service. Mychart messages are not routinely monitored during evenings, weekends, and holidays. Please call our office to contact the answering service for urgent concerns during non-business hours.    If you have FMLA/disability paperwork, please drop it off or fax it to [phone], attention Patty.   Appointments/FMLA & disability paperwork: Joycelyn Rua, & Flonnie Hailstone Registered Nurse/Surgery scheduler: Royston Cowper Medical Assistants: Nash Mantis Physician Assistants: Joan Flores, PA-C, Manning Charity, PA-C & Drake Leach, PA-C Surgeons: Venetia Night, MD & Ernestine Mcmurray, MD

## 2023-04-12 NOTE — Telephone Encounter (Signed)
Patient called in to follow up on fax for surgical clearance. Informed patient that fax was received. Patient is scheduled for surgery next week. Form in [facility] [address].

## 2023-04-13 NOTE — Telephone Encounter (Signed)
Surgery; scheduled for 04/18/23 LEFT L5-S1 MICRODISCECTOMY    Diagnosis for Eliquis treatment is hx portal vein thrombosis and PE.  Actual wt: 111.6 kg Ideal wt: 78 kg (actual wt > 144% of ideal wt) Adjusted wt: 91 kg CrCl: 130.3 mL/min, using adjusted wt for calculation  Lovenox dosing;   Daily; 150 mg (1.5 mg/kg)  BID; 100 mg (1 mg/kg)  Lovenox bridge for 3 day pre-op Eliquis hold and 14 day post-op Eliquis hold below.  2/14: Take last dose of Eliquis 2/15: NO Eliquis; inject Lovenox 2/16: NO Eliquis; NO Lovenox  2/17: SURGERY; NO ELIQUIS, NO LOVENOX  2/18: NO Eliquis, inject Lovenox 2/19: NO Eliquis, inject Lovenox 2/20: NO Eliquis, inject Lovenox 2/21: NO Eliquis, inject Lovenox 2/22: NO Eliquis, inject Lovenox 2/23: NO Eliquis, inject Lovenox 2/24: NO Eliquis, inject Lovenox 2/25: NO Eliquis, inject Lovenox 2/26: NO Eliquis, inject Lovenox 2/27: NO Eliquis, inject Lovenox 2/28: NO Eliquis, inject Lovenox 3/1: NO Eliquis, inject Lovenox 3/2: NO Eliquis, inject Lovenox 3/3: Restart Eliquis in the morning, stop Lovenox injections  Will discuss bridge with PCP tomorrow in clinic.

## 2023-04-13 NOTE — Telephone Encounter (Signed)
I am working on the hardcopy form. He is going to need lovenox bridging.    Neurosurgery is requesting holding eliquis 3 days prior and 14 days after surgery.   Surgery is scheduled for 04/17/13.    I need anticoagulation clinic help setting up his bridge.  I routed this in the meantime.

## 2023-04-14 MED ORDER — ENOXAPARIN SODIUM 150 MG/ML IJ SOSY
150.0000 mg | PREFILLED_SYRINGE | INTRAMUSCULAR | 0 refills | Status: DC
Start: 2023-04-14 — End: 2023-05-03

## 2023-04-14 NOTE — Telephone Encounter (Signed)
Per PCP, proceed with Lovenox bridge with once daily dosing, 1.5 mg/kg, 150 mg once daily.  Paperwork completed and pending PCP signature.  Contacted pt and advised of need for Lovenox bridge. Educated concerning need for bridge and also how to administer injections. Inquired if pt would like to come to the office for education. 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 He declined an in office apt. Advised instructions will be sent via mychart as well as direct number to coumadin clinic nurse if any questions. Verified pharmacy pt requested for script. Advised to f/u with pharmacy by tomorrow morning to assure they will have the script ready. Advised if any problems to contact the coumadin clinic. Pt verbalized understanding.  Sent in script.   Sent mychart msg with instructions.

## 2023-04-14 NOTE — Telephone Encounter (Signed)
Agree. Thanks

## 2023-04-14 NOTE — Addendum Note (Signed)
Addended by: Sherrie George A on: 04/14/2023 11:39 AM   Modules accepted: Orders

## 2023-04-14 NOTE — Telephone Encounter (Signed)
Paperwork signed by PCP and faxed to requested number. Also forwarding note to neurosurgeon.

## 2023-04-15 ENCOUNTER — Telehealth: Payer: Self-pay

## 2023-04-15 ENCOUNTER — Encounter
Admission: RE | Admit: 2023-04-15 | Discharge: 2023-04-15 | Disposition: A | Discharge status: Home / Self Care | Payer: BC Managed Care – PPO | Source: Ambulatory Visit | Attending: Neurosurgery | Admitting: Neurosurgery

## 2023-04-15 ENCOUNTER — Other Ambulatory Visit: Payer: Self-pay

## 2023-04-15 HISTORY — DX: Iron deficiency anemia, unspecified: D50.9

## 2023-04-15 HISTORY — DX: Long term (current) use of anticoagulants: Z79.01

## 2023-04-15 NOTE — Telephone Encounter (Signed)
Copied from CRM [id number]. Topic: General - Other >> Apr 15, 2023  9:39 AM Truddie Crumble wrote: Reason for CRM: patient called stating he is supposed to take enoxaparin on tomorrow for surgery but the pharmacy would not have the medication until Monday.

## 2023-04-15 NOTE — Telephone Encounter (Signed)
Spoke with the [facility] on  [address] and they have the medication in stock and will have it ready in about a hour. Patient was notified

## 2023-04-15 NOTE — Telephone Encounter (Signed)
I received the following message from [place], [place] with PAT: "your order states hold eliquis 3 days...surgery 2/17; we would tell him his last dose is 2/13...Marland KitchenMarland Kitchenepic note from Sherrie George RN states "2/14: Take last dose of Eliquis" which that would be a 2 day hold.Marland KitchenMarland KitchenMarland KitchenI haven't called the patient yet to verify what day he has actually stopped the eliquis, just wanted to let you know "   I informed her that we always hold eliquis 3 days prior to surgery. Angelique Blonder called the patient and he reported to her that he already took his eliquis this morning.   Per discussion with Dr Myer Haff, I called the patient and notified him that we will keep him on the OR schedule for Monday, but not to take anymore eliquis between now and surgery. He verbalized understanding.

## 2023-04-15 NOTE — Patient Instructions (Addendum)
Your procedure is scheduled on: Monday, February 17 Report to the Registration Desk on the 1st floor of the [facility] To find out your arrival time, please call [phone] between 1PM - 3PM on: Friday, February 14 If your arrival time is 6:00 am, do not arrive before that time as the [facility] entrance doors do not open until 6:00 am.  REMEMBER: Instructions that are not followed completely may result in serious medical risk, up to and including death; or upon the discretion of your surgeon and anesthesiologist your surgery may need to be rescheduled.  Do not eat food after midnight the night before surgery.  No gum chewing or hard candies.  You may however, drink CLEAR liquids up to 2 hours before you are scheduled to arrive for your surgery. Do not drink anything within 2 hours of your scheduled arrival time.  Clear liquids include: - water  - apple juice without pulp - gatorade (not RED colors) - black coffee or tea (Do NOT add milk or creamers to the coffee or tea) Do NOT drink anything that is not on this list.  One week prior to surgery: Stop ANY OVER THE COUNTER supplements until after surgery.  You may however, continue to take Tylenol if needed for pain up until the day of surgery.  apixaban (ELIQUIS) - stop 3 days before surgery. You stated that your last dose was February 14 am. Following instructions that you were given for Lovenox bridging.   Continue taking all of your other prescription medications up until the day of surgery.  ON THE DAY OF SURGERY ONLY TAKE THESE MEDICATIONS WITH SIPS OF WATER:  Hydrocodone if needed for pain  No Alcohol for 24 hours before or after surgery.  No Smoking including e-cigarettes for 24 hours before surgery.  No chewable tobacco products for at least 6 hours before surgery.  No nicotine patches on the day of surgery.  Do not use any "recreational" drugs for at least a week (preferably 2 weeks) before your surgery.   Please be advised that the combination of cocaine and anesthesia may have negative outcomes, up to and including death. If you test positive for cocaine, your surgery will be cancelled.  On the morning of surgery brush your teeth with toothpaste and water, you may rinse your mouth with mouthwash if you wish. Do not swallow any toothpaste or mouthwash.  Use CHG Soap as directed on instruction sheet.  Do not wear jewelry, make-up, hairpins, clips or nail polish.  For welded (permanent) jewelry: bracelets, anklets, waist bands, etc.  Please have this removed prior to surgery.  If it is not removed, there is a chance that hospital personnel will need to cut it off on the day of surgery.  Do not wear lotions, powders, or perfumes.   Do not shave body hair from the neck down 48 hours before surgery.  Contact lenses, hearing aids and dentures may not be worn into surgery.  Do not bring valuables to the hospital. [facility] is not responsible for any missing/lost belongings or valuables.   Notify your doctor if there is any change in your medical condition (cold, fever, infection).  Wear comfortable clothing (specific to your surgery type) to the hospital.  After surgery, you can help prevent lung complications by doing breathing exercises.  Take deep breaths and cough every 1-2 hours. Your doctor may order a device called an Incentive Spirometer to help you take deep breaths.  If you are being discharged  the day of surgery, you will not be allowed to drive home. You will need a responsible individual to drive you home and stay with you for 24 hours after surgery.   If you are taking public transportation, you will need to have a responsible individual with you.  Please call the Pre-admissions Testing Dept. at [phone] if you have any questions about these instructions.  Surgery Visitation Policy:  Patients having surgery or a procedure may have two visitors.  Children under the  age of 73 must have an adult with them who is not the patient.  Temporary Visitor Restrictions Due to increasing cases of flu, RSV and COVID-19: Children ages 34 and under will not be able to visit patients in [facility] hospitals under most circumstances.      Preparing for Surgery with CHLORHEXIDINE GLUCONATE (CHG) Soap  Chlorhexidine Gluconate (CHG) Soap  o An antiseptic cleaner that kills germs and bonds with the skin to continue killing germs even after washing  o Used for showering the night before surgery and morning of surgery  Before surgery, you can play an important role by reducing the number of germs on your skin.  CHG (Chlorhexidine gluconate) soap is an antiseptic cleanser which kills germs and bonds with the skin to continue killing germs even after washing.  Please do not use if you have an allergy to CHG or antibacterial soaps. If your skin becomes reddened/irritated stop using the CHG.  1. Shower the NIGHT BEFORE SURGERY and the MORNING OF SURGERY with CHG soap.  2. If you choose to wash your hair, wash your hair first as usual with your normal shampoo.  3. After shampooing, rinse your hair and body thoroughly to remove the shampoo.  4. Use CHG as you would any other liquid soap. You can apply CHG directly to the skin and wash gently with a scrungie or a clean washcloth.  5. Apply the CHG soap to your body only from the neck down. Do not use on open wounds or open sores. Avoid contact with your eyes, ears, mouth, and genitals (private parts). Wash face and genitals (private parts) with your normal soap.  6. Wash thoroughly, paying special attention to the area where your surgery will be performed.  7. Thoroughly rinse your body with warm water.  8. Do not shower/wash with your normal soap after using and rinsing off the CHG soap.  9. Pat yourself dry with a clean towel.  10. Wear clean pajamas to bed the night before surgery.  12. Place clean sheets on  your bed the night of your first shower and do not sleep with pets.  13. Shower again with the CHG soap on the day of surgery prior to arriving at the hospital.  14. Do not apply any deodorants/lotions/powders.  15. Please wear clean clothes to the hospital.

## 2023-04-18 ENCOUNTER — Ambulatory Visit
Admission: RE | Admit: 2023-04-18 | Discharge: 2023-04-18 | Disposition: A | Discharge status: Home / Self Care | Payer: BC Managed Care – PPO | Source: Ambulatory Visit | Attending: Neurosurgery | Admitting: Neurosurgery

## 2023-04-18 ENCOUNTER — Ambulatory Visit: Payer: BC Managed Care – PPO

## 2023-04-18 ENCOUNTER — Other Ambulatory Visit: Payer: Self-pay

## 2023-04-18 ENCOUNTER — Ambulatory Visit: Payer: BC Managed Care – PPO | Admitting: Urgent Care

## 2023-04-18 ENCOUNTER — Encounter: Payer: Self-pay | Admitting: Neurosurgery

## 2023-04-18 ENCOUNTER — Encounter
Admission: RE | Disposition: A | Discharge status: Home / Self Care | Payer: Self-pay | Source: Ambulatory Visit | Attending: Neurosurgery

## 2023-04-18 DIAGNOSIS — R161 Splenomegaly, not elsewhere classified: Secondary | ICD-10-CM | POA: Diagnosis not present

## 2023-04-18 DIAGNOSIS — I8500 Esophageal varices without bleeding: Secondary | ICD-10-CM | POA: Insufficient documentation

## 2023-04-18 DIAGNOSIS — Z01812 Encounter for preprocedural laboratory examination: Secondary | ICD-10-CM

## 2023-04-18 DIAGNOSIS — Z01818 Encounter for other preprocedural examination: Secondary | ICD-10-CM

## 2023-04-18 DIAGNOSIS — M5117 Intervertebral disc disorders with radiculopathy, lumbosacral region: Secondary | ICD-10-CM | POA: Diagnosis not present

## 2023-04-18 DIAGNOSIS — D509 Iron deficiency anemia, unspecified: Secondary | ICD-10-CM

## 2023-04-18 DIAGNOSIS — Z7901 Long term (current) use of anticoagulants: Secondary | ICD-10-CM

## 2023-04-18 DIAGNOSIS — K766 Portal hypertension: Secondary | ICD-10-CM

## 2023-04-18 DIAGNOSIS — D500 Iron deficiency anemia secondary to blood loss (chronic): Secondary | ICD-10-CM

## 2023-04-18 DIAGNOSIS — Z01810 Encounter for preprocedural cardiovascular examination: Secondary | ICD-10-CM

## 2023-04-18 DIAGNOSIS — M5416 Radiculopathy, lumbar region: Secondary | ICD-10-CM

## 2023-04-18 DIAGNOSIS — Z8719 Personal history of other diseases of the digestive system: Secondary | ICD-10-CM

## 2023-04-18 DIAGNOSIS — R29898 Other symptoms and signs involving the musculoskeletal system: Secondary | ICD-10-CM | POA: Diagnosis not present

## 2023-04-18 DIAGNOSIS — K921 Melena: Secondary | ICD-10-CM

## 2023-04-18 HISTORY — PX: LUMBAR LAMINECTOMY/DECOMPRESSION MICRODISCECTOMY: SHX5026

## 2023-04-18 SURGERY — LUMBAR LAMINECTOMY/DECOMPRESSION MICRODISCECTOMY 1 LEVEL
Anesthesia: General | Laterality: Left

## 2023-04-18 MED ORDER — HYDROMORPHONE HCL 1 MG/ML IJ SOLN
INTRAMUSCULAR | Status: DC | PRN
  Administered 2023-04-18: .5 mg via INTRAVENOUS

## 2023-04-18 MED ORDER — REMIFENTANIL HCL 1 MG IV SOLR
INTRAVENOUS | Status: AC
  Filled 2023-04-18: qty 1000

## 2023-04-18 MED ORDER — CEFAZOLIN IN SODIUM CHLORIDE 2-0.9 GM/100ML-% IV SOLN
2.0000 g | Freq: Once | INTRAVENOUS | Status: AC
  Administered 2023-04-18: 2 g via INTRAVENOUS
  Filled 2023-04-18: qty 100

## 2023-04-18 MED ORDER — PROPOFOL 10 MG/ML IV BOLUS
INTRAVENOUS | Status: AC
  Filled 2023-04-18: qty 20

## 2023-04-18 MED ORDER — CEFAZOLIN SODIUM-DEXTROSE 2-4 GM/100ML-% IV SOLN
INTRAVENOUS | Status: AC
  Filled 2023-04-18: qty 100

## 2023-04-18 MED ORDER — EPHEDRINE SULFATE-NACL 50-0.9 MG/10ML-% IV SOSY
PREFILLED_SYRINGE | INTRAVENOUS | Status: DC | PRN
  Administered 2023-04-18 (×2): 5 mg via INTRAVENOUS

## 2023-04-18 MED ORDER — SODIUM CHLORIDE (PF) 0.9 % IJ SOLN
INTRAMUSCULAR | Status: DC | PRN
  Administered 2023-04-18: 60 mL

## 2023-04-18 MED ORDER — 0.9 % SODIUM CHLORIDE (POUR BTL) OPTIME
TOPICAL | Status: DC | PRN
  Administered 2023-04-18: 500 mL

## 2023-04-18 MED ORDER — KETAMINE HCL 10 MG/ML IJ SOLN
INTRAMUSCULAR | Status: DC | PRN
Start: 2023-04-18 — End: 2023-04-18
  Administered 2023-04-18: 10 mg via INTRAVENOUS

## 2023-04-18 MED ORDER — BUPIVACAINE-EPINEPHRINE (PF) 0.5% -1:200000 IJ SOLN
INTRAMUSCULAR | Status: DC | PRN
  Administered 2023-04-18: 8 mL via PERINEURAL

## 2023-04-18 MED ORDER — HYDROMORPHONE HCL 1 MG/ML IJ SOLN
INTRAMUSCULAR | Status: AC
  Filled 2023-04-18: qty 1

## 2023-04-18 MED ORDER — FENTANYL CITRATE (PF) 100 MCG/2ML IJ SOLN
INTRAMUSCULAR | Status: DC | PRN
  Administered 2023-04-18: 50 ug via INTRAVENOUS

## 2023-04-18 MED ORDER — REMIFENTANIL HCL 1 MG IV SOLR
INTRAVENOUS | Status: DC | PRN
  Administered 2023-04-18: .15 ug/kg/min via INTRAVENOUS

## 2023-04-18 MED ORDER — CHLORHEXIDINE GLUCONATE 0.12 % MT SOLN
15.0000 mL | Freq: Once | OROMUCOSAL | Status: AC
  Administered 2023-04-18: 15 mL via OROMUCOSAL

## 2023-04-18 MED ORDER — METHYLPREDNISOLONE ACETATE 40 MG/ML IJ SUSP
INTRAMUSCULAR | Status: AC
  Filled 2023-04-18: qty 1

## 2023-04-18 MED ORDER — CHLORHEXIDINE GLUCONATE 0.12 % MT SOLN
OROMUCOSAL | Status: AC
  Filled 2023-04-18: qty 15

## 2023-04-18 MED ORDER — METHOCARBAMOL 500 MG PO TABS: 500.0000 mg | ORAL_TABLET | Freq: Four times a day (QID) | ORAL | 0 refills | Status: AC | PRN

## 2023-04-18 MED ORDER — METHYLPREDNISOLONE ACETATE 40 MG/ML IJ SUSP
INTRAMUSCULAR | Status: DC | PRN
  Administered 2023-04-18: 40 mg

## 2023-04-18 MED ORDER — REMIFENTANIL HCL 1 MG IV SOLR
INTRAVENOUS | Status: AC
Start: 2023-04-18 — End: ?
  Filled 2023-04-18: qty 1000

## 2023-04-18 MED ORDER — FENTANYL CITRATE (PF) 100 MCG/2ML IJ SOLN
25.0000 ug | INTRAMUSCULAR | Status: DC | PRN
  Administered 2023-04-18: 25 ug via INTRAVENOUS

## 2023-04-18 MED ORDER — DEXAMETHASONE SODIUM PHOSPHATE 10 MG/ML IJ SOLN
INTRAMUSCULAR | Status: DC | PRN
  Administered 2023-04-18: 10 mg via INTRAVENOUS

## 2023-04-18 MED ORDER — OXYCODONE HCL 5 MG PO TABS
5.0000 mg | ORAL_TABLET | Freq: Once | ORAL | Status: AC
  Administered 2023-04-18: 5 mg via ORAL

## 2023-04-18 MED ORDER — ORAL CARE MOUTH RINSE: 15.0000 mL | Freq: Once | OROMUCOSAL | Status: AC

## 2023-04-18 MED ORDER — ONDANSETRON HCL 4 MG/2ML IJ SOLN
INTRAMUSCULAR | Status: DC | PRN
  Administered 2023-04-18 (×2): 4 mg via INTRAVENOUS

## 2023-04-18 MED ORDER — BUPIVACAINE-EPINEPHRINE (PF) 0.5% -1:200000 IJ SOLN
INTRAMUSCULAR | Status: AC
  Filled 2023-04-18: qty 20

## 2023-04-18 MED ORDER — SODIUM CHLORIDE (PF) 0.9 % IJ SOLN
INTRAMUSCULAR | Status: AC
Start: 2023-04-18 — End: ?
  Filled 2023-04-18: qty 20

## 2023-04-18 MED ORDER — DROPERIDOL 2.5 MG/ML IJ SOLN: 0.6250 mg | Freq: Once | INTRAMUSCULAR | Status: DC | PRN

## 2023-04-18 MED ORDER — SEVOFLURANE IN SOLN
RESPIRATORY_TRACT | Status: AC
  Filled 2023-04-18: qty 250

## 2023-04-18 MED ORDER — LACTATED RINGERS IV SOLN: INTRAVENOUS | Status: DC

## 2023-04-18 MED ORDER — SURGIFLO WITH THROMBIN (HEMOSTATIC MATRIX KIT) OPTIME
TOPICAL | Status: DC | PRN
  Administered 2023-04-18: 1 via TOPICAL

## 2023-04-18 MED ORDER — KETAMINE HCL 50 MG/5ML IJ SOSY
PREFILLED_SYRINGE | INTRAMUSCULAR | Status: AC
  Filled 2023-04-18: qty 5

## 2023-04-18 MED ORDER — MIDAZOLAM HCL 2 MG/2ML IJ SOLN
INTRAMUSCULAR | Status: DC | PRN
  Administered 2023-04-18: 2 mg via INTRAVENOUS

## 2023-04-18 MED ORDER — PROPOFOL 1000 MG/100ML IV EMUL
INTRAVENOUS | Status: AC
Start: 2023-04-18 — End: ?
  Filled 2023-04-18: qty 200

## 2023-04-18 MED ORDER — OXYCODONE HCL 5 MG PO TABS: 5.0000 mg | ORAL_TABLET | ORAL | 0 refills | Status: DC | PRN

## 2023-04-18 MED ORDER — FENTANYL CITRATE (PF) 100 MCG/2ML IJ SOLN
INTRAMUSCULAR | Status: AC
Start: 2023-04-18 — End: ?
  Filled 2023-04-18: qty 2

## 2023-04-18 MED ORDER — ACETAMINOPHEN 10 MG/ML IV SOLN
INTRAVENOUS | Status: AC
  Filled 2023-04-18: qty 100

## 2023-04-18 MED ORDER — PROPOFOL 10 MG/ML IV BOLUS
INTRAVENOUS | Status: DC | PRN
  Administered 2023-04-18: 100 mg via INTRAVENOUS
  Administered 2023-04-18: 50 mg via INTRAVENOUS
  Administered 2023-04-18: 200 mg via INTRAVENOUS

## 2023-04-18 MED ORDER — FENTANYL CITRATE (PF) 100 MCG/2ML IJ SOLN
INTRAMUSCULAR | Status: AC
  Filled 2023-04-18: qty 2

## 2023-04-18 MED ORDER — PHENYLEPHRINE 80 MCG/ML (10ML) SYRINGE FOR IV PUSH (FOR BLOOD PRESSURE SUPPORT)
PREFILLED_SYRINGE | INTRAVENOUS | Status: DC | PRN
  Administered 2023-04-18: 160 ug via INTRAVENOUS
  Administered 2023-04-18: 240 ug via INTRAVENOUS
  Administered 2023-04-18: 80 ug via INTRAVENOUS

## 2023-04-18 MED ORDER — PHENYLEPHRINE HCL-NACL 20-0.9 MG/250ML-% IV SOLN
INTRAVENOUS | Status: DC | PRN
Start: 2023-04-18 — End: 2023-04-18
  Administered 2023-04-18: 25 ug/min via INTRAVENOUS

## 2023-04-18 MED ORDER — ACETAMINOPHEN 10 MG/ML IV SOLN
INTRAVENOUS | Status: DC | PRN
  Administered 2023-04-18: 1000 mg via INTRAVENOUS

## 2023-04-18 MED ORDER — MIDAZOLAM HCL 2 MG/2ML IJ SOLN
INTRAMUSCULAR | Status: AC
  Filled 2023-04-18: qty 2

## 2023-04-18 MED ORDER — SUCCINYLCHOLINE CHLORIDE 200 MG/10ML IV SOSY
PREFILLED_SYRINGE | INTRAVENOUS | Status: DC | PRN
  Administered 2023-04-18: 180 mg via INTRAVENOUS

## 2023-04-18 MED ORDER — BUPIVACAINE HCL (PF) 0.5 % IJ SOLN
INTRAMUSCULAR | Status: AC
  Filled 2023-04-18: qty 30

## 2023-04-18 MED ORDER — PHENYLEPHRINE HCL-NACL 20-0.9 MG/250ML-% IV SOLN
INTRAVENOUS | Status: AC
  Filled 2023-04-18: qty 250

## 2023-04-18 MED ORDER — BUPIVACAINE LIPOSOME 1.3 % IJ SUSP
INTRAMUSCULAR | Status: AC
  Filled 2023-04-18: qty 20

## 2023-04-18 MED ORDER — LIDOCAINE HCL (CARDIAC) PF 100 MG/5ML IV SOSY
PREFILLED_SYRINGE | INTRAVENOUS | Status: DC | PRN
  Administered 2023-04-18: 100 mg via INTRAVENOUS

## 2023-04-18 MED ORDER — OXYCODONE HCL 5 MG PO TABS
ORAL_TABLET | ORAL | Status: AC
  Filled 2023-04-18: qty 1

## 2023-04-18 MED ORDER — GLYCOPYRROLATE 0.2 MG/ML IJ SOLN
INTRAMUSCULAR | Status: DC | PRN
  Administered 2023-04-18: .2 mg via INTRAVENOUS

## 2023-04-18 MED ORDER — SENNA 8.6 MG PO TABS: 1.0000 | ORAL_TABLET | Freq: Two times a day (BID) | ORAL | 0 refills | Status: DC | PRN

## 2023-04-18 SURGICAL SUPPLY — 36 items
BASIN KIT SINGLE STR (MISCELLANEOUS) ×2 IMPLANT
BUR NEURO DRILL SOFT 3.0X3.8M (BURR) ×2 IMPLANT
DERMABOND ADVANCED .7 DNX12 (GAUZE/BANDAGES/DRESSINGS) ×2 IMPLANT
DRAPE C ARM PK CFD 31 SPINE (DRAPES) ×2 IMPLANT
DRAPE LAPAROTOMY 100X77 ABD (DRAPES) ×2 IMPLANT
DRAPE MICROSCOPE SPINE 48X150 (DRAPES) ×2 IMPLANT
DRSG OPSITE POSTOP 3X4 (GAUZE/BANDAGES/DRESSINGS) ×2 IMPLANT
ELECT EZSTD 165MM 6.5IN (MISCELLANEOUS) ×1 IMPLANT
ELECT REM PT RETURN 9FT ADLT (ELECTROSURGICAL) ×1 IMPLANT
ELECTRODE EZSTD 165MM 6.5IN (MISCELLANEOUS) ×2 IMPLANT
ELECTRODE REM PT RTRN 9FT ADLT (ELECTROSURGICAL) ×2 IMPLANT
GLOVE BIOGEL PI IND STRL 6.5 (GLOVE) ×2 IMPLANT
GLOVE SURG SYN 6.5 ES PF (GLOVE) ×1 IMPLANT
GLOVE SURG SYN 6.5 PF PI (GLOVE) ×2 IMPLANT
GLOVE SURG SYN 8.5 E (GLOVE) ×3 IMPLANT
GLOVE SURG SYN 8.5 PF PI (GLOVE) ×6 IMPLANT
GOWN SRG LRG LVL 4 IMPRV REINF (GOWNS) ×2 IMPLANT
GOWN SRG XL LVL 3 NONREINFORCE (GOWNS) ×2 IMPLANT
KIT SPINAL PRONEVIEW (KITS) ×2 IMPLANT
MANIFOLD NEPTUNE II (INSTRUMENTS) ×2 IMPLANT
MARKER SKIN DUAL TIP RULER LAB (MISCELLANEOUS) ×2 IMPLANT
NDL SAFETY ECLIPSE 18X1.5 (NEEDLE) ×2 IMPLANT
NS IRRIG 500ML POUR BTL (IV SOLUTION) ×2 IMPLANT
PACK LAMINECTOMY ARMC (PACKS) ×2 IMPLANT
PAD ARMBOARD 7.5X6 YLW CONV (MISCELLANEOUS) ×2 IMPLANT
PATTIES SURGICAL .5 X.5 (GAUZE/BANDAGES/DRESSINGS) IMPLANT
PATTIES SURGICAL 1X1 (DISPOSABLE) IMPLANT
PENCIL SMOKE EVACUATOR (MISCELLANEOUS) IMPLANT
SURGIFLO W/THROMBIN 8M KIT (HEMOSTASIS) ×2 IMPLANT
SUT STRATA 3-0 15 PS-2 (SUTURE) ×2 IMPLANT
SUT VIC AB 0 CT1 27XCR 8 STRN (SUTURE) ×2 IMPLANT
SUT VIC AB 2-0 CT1 18 (SUTURE) ×2 IMPLANT
SYR 30ML LL (SYRINGE) ×4 IMPLANT
SYR 3ML LL SCALE MARK (SYRINGE) ×2 IMPLANT
TRAP FLUID SMOKE EVACUATOR (MISCELLANEOUS) ×2 IMPLANT
WATER STERILE IRR 500ML POUR (IV SOLUTION) IMPLANT

## 2023-04-18 NOTE — Discharge Summary (Signed)
 Discharge Summary  Patient ID: Nathan Rowland MRN: [id number] DOB/AGE: 07-09-1980 43 y.o.  Admit date: 04/18/2023 Discharge date: 04/18/2023  Admission Diagnoses: M54.16 lumbar radiculopathy, R29.898 left leg weakness  Discharge Diagnoses:  Active Problems:   * No active hospital problems. *   Discharged Condition: good  Hospital Course:  Nathan Rowland is a 43 y.o presenting with radiculopathy and LLE weakness s/p left L5-S1 microdiscectomy. His intraoperative course was uncomplicated. He was monitored in PACU and discharged home after ambulating, urinating, and tolerating PO intake. He was given prescriptions for Oxycodone, Robaxin, and Senna to take as needed. He was given instructions regarding his Lovenox bridge pre-op. He is to take 40mg  daily for 7 days prior to proceeding increased dosage.   Consults: None  Significant Diagnostic Studies: none  Treatments: surgery: as above. Please see separately dictated operative report for further details   Discharge Exam: Blood pressure 137/83, pulse 83, temperature 98 F (36.7 C), resp. rate 18, height 6' (1.829 m), weight 111.1 kg, SpO2 97%. CN grossly intact MAEW Incision with clean post-op dressing in place   Disposition: Discharge disposition: 01-Home or Self Care        Allergies as of 04/18/2023       Reactions   Pollen Extract     Allergy injection        Medication List     STOP taking these medications    apixaban 5 MG Tabs tablet Commonly known as: ELIQUIS   HYDROcodone-acetaminophen 5-325 MG tablet Commonly known as: NORCO/VICODIN       TAKE these medications    enoxaparin 150 MG/ML injection Commonly known as: LOVENOX Inject 1 mL (150 mg total) into the skin daily. TAKE AS DIRECTED BY ANTICOAGULATION CLINIC   EPINEPHrine 0.3 mg/0.3 mL Soaj injection Commonly known as: EPI-PEN Inject 0.3 mg into the muscle once as needed for anaphylaxis.   methocarbamol 500 MG tablet Commonly known  as: ROBAXIN Take 1 tablet (500 mg total) by mouth every 6 (six) hours as needed for muscle spasms.   oxyCODONE 5 MG immediate release tablet Commonly known as: Roxicodone Take 1-2 tablets (5-10 mg total) by mouth every 4 (four) hours as needed for severe pain (pain score 7-10).   senna 8.6 MG Tabs tablet Commonly known as: SENOKOT Take 1 tablet (8.6 mg total) by mouth 2 (two) times daily as needed for mild constipation.        Follow-up Information     Susanne Borders, PA Follow up on 05/03/2023.   Specialty: Neurosurgery Contact information: [address] [place] [address] [phone]                 Signed: Susanne Borders 04/18/2023, 10:14 AM

## 2023-04-18 NOTE — Anesthesia Preprocedure Evaluation (Signed)
 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   Endo/Other  negative endocrine ROS    Renal/GU negative Renal ROS     Musculoskeletal   Abdominal  (+) + obese  Peds  Hematology  (+) Blood dyscrasia (iron deficiency), anemia   Anesthesia Other Findings Last Eliquis: 07/11/2022  Hgb 7.1 after 4 units pRBCs. 1 unit pRBC being started in preop.  Reproductive/Obstetrics                             Anesthesia Physical Anesthesia Plan  ASA: 3  Anesthesia Plan: General   Post-op Pain Management:    Induction: Intravenous  PONV Risk Score and Plan: 2 and Treatment may vary due to age or medical condition, Ondansetron, Dexamethasone and Midazolam  Airway Management Planned: Oral ETT  Additional Equipment:   Intra-op Plan:   Post-operative Plan: Extubation in OR  Informed Consent: I have reviewed the patients History and Physical, chart, labs and discussed the procedure including the risks, benefits and alternatives for the proposed anesthesia with the patient or authorized representative who has indicated his/her understanding and acceptance.     Dental  advisory given  Plan Discussed with: CRNA and Anesthesiologist  Anesthesia Plan Comments:        Anesthesia Quick Evaluation

## 2023-04-18 NOTE — Transfer of Care (Signed)
 Immediate Anesthesia Transfer of Care Note  Patient: Nathan Rowland  Procedure(s) Performed: LEFT L5-S1 MICRODISCECTOMY (Left)  Patient Location: PACU  Anesthesia Type:General  Level of Consciousness: awake and oriented  Airway & Oxygen Therapy: Patient Spontanous Breathing and Patient connected to face mask oxygen  Post-op Assessment: Report given to RN and Post -op Vital signs reviewed and stable  Post vital signs: Reviewed and stable  Last Vitals:  Vitals Value Taken Time  BP 133/91 04/18/23 1019  Temp    Pulse 79 04/18/23 1023  Resp 12 04/18/23 1023  SpO2 97 % 04/18/23 1023  Vitals shown include unfiled device data.  Last Pain:  Vitals:   04/18/23 0619  PainSc: 7          Complications: No notable events documented.

## 2023-04-18 NOTE — Anesthesia Procedure Notes (Addendum)
 Procedure Name: Intubation Date/Time: 04/18/2023 8:29 AM  Performed by: Susy Manor, RNPre-anesthesia Checklist: Patient identified, Emergency Drugs available, Suction available and Patient being monitored Patient Re-evaluated:Patient Re-evaluated prior to induction Oxygen Delivery Method: Circle system utilized Preoxygenation: Pre-oxygenation with 100% oxygen Induction Type: IV induction Ventilation: Mask ventilation without difficulty Laryngoscope Size: McGrath and 4 Grade View: Grade I Tube type: Oral Tube size: 7.5 mm Number of attempts: 1 Airway Equipment and Method: Stylet and Oral airway Placement Confirmation: ETT inserted through vocal cords under direct vision, positive ETCO2 and breath sounds checked- equal and bilateral Secured at: 22 cm Tube secured with: Tape Dental Injury: Teeth and Oropharynx as per pre-operative assessment

## 2023-04-18 NOTE — Discharge Instructions (Addendum)
 Your surgeon has performed an operation on your lumbar spine (low back) to relieve pressure on one or more nerves. Many times, patients feel better immediately after surgery and can "overdo it." Even if you feel well, it is important that you follow these activity guidelines. If you do not let your back heal properly from the surgery, you can increase the chance of a disc herniation and/or return of your symptoms. The following are instructions to help in your recovery once you have been discharged from the hospital.  * It is ok to take NSAIDs after surgery. Take 40mg  Lovenox for 7 days prior to escalating to higher dose.   Activity    No bending, lifting, or twisting ("BLT"). Avoid lifting objects heavier than 10 pounds (gallon milk jug).  Where possible, avoid household activities that involve lifting, bending, pushing, or pulling such as laundry, vacuuming, grocery shopping, and childcare. Try to arrange for help from friends and family for these activities while your back heals.  Increase physical activity slowly as tolerated.  Taking short walks is encouraged, but avoid strenuous exercise. Do not jog, run, bicycle, lift weights, or participate in any other exercises unless specifically allowed by your doctor. Avoid prolonged sitting, including car rides.  Talk to your doctor before resuming sexual activity.  You should not drive until cleared by your doctor.  Until released by your doctor, you should not return to work or school.  You should rest at home and let your body heal.   You may shower three days after your surgery.  After showering, lightly dab your incision dry. Do not take a tub bath or go swimming for 3 weeks, or until approved by your doctor at your follow-up appointment.  If you smoke, we strongly recommend that you quit.  Smoking has been proven to interfere with normal healing in your back and will dramatically reduce the success rate of your surgery. Please contact  [facility] (800-QUIT-NOW) and use the resources at www.[facility].com for assistance in stopping smoking.  Surgical Incision   If you have a dressing on your incision, you may remove it three days after your surgery. Keep your incision area clean and dry.  If you have staples or stitches on your incision, you should have a follow up scheduled for removal. If you do not have staples or stitches, you will have steri-strips (small pieces of surgical tape) or Dermabond glue. The steri-strips/glue should begin to peel away within about a week (it is fine if the steri-strips fall off before then). If the strips are still in place one week after your surgery, you may gently remove them.  Diet            You may return to your usual diet. Be sure to stay hydrated.  When to Contact us  Although your surgery and recovery will likely be uneventful, you may have some residual numbness, aches, and pains in your back and/or legs. This is normal and should improve in the next few weeks.  However, should you experience any of the following, contact us immediately: New numbness or weakness Pain that is progressively getting worse, and is not relieved by your pain medications or rest Bleeding, redness, swelling, pain, or drainage from surgical incision Chills or flu-like symptoms Fever greater than 101.0 F (38.3 C) Problems with bowel or bladder functions Difficulty breathing or shortness of breath Warmth, tenderness, or swelling in your calf  Contact Information How to contact us:  If you have any questions/concerns  before or after surgery, you can reach [place] at [phone], or you can send a mychart message. We can be reached by phone or mychart 8am-4pm, Monday-Friday.  *Please note: Calls after 4pm are forwarded to a third party answering service. Mychart messages are not routinely monitored during evenings, weekends, and holidays. Please call our office to contact the answering service for urgent  concerns during non-business hours.

## 2023-04-18 NOTE — Interval H&P Note (Signed)
 History and Physical Interval Note:  04/18/2023 8:07 AM  Nathan Rowland  has presented today for surgery, with the diagnosis of M54.16 lumbar radiculopathy R29.898 left leg weakness.  The various methods of treatment have been discussed with the patient and family. After consideration of risks, benefits and other options for treatment, the patient has consented to  Procedure(s): LEFT L5-S1 MICRODISCECTOMY (Left) as a surgical intervention.  The patient's history has been reviewed, patient examined, no change in status, stable for surgery.  I have reviewed the patient's chart and labs.  Questions were answered to the patient's satisfaction.    Heart sounds normal no MRG. Chest Clear to Auscultation Bilaterally.   [name]

## 2023-04-18 NOTE — Progress Notes (Signed)
 Patient was able to intake po fluids and crackers, ambulate and void without difficulty. Called Dr. Myer Haff and Manning Charity PA to clarify lovenox orders. Patient is to take 40 mg for 7 days then elevate dose after 7 days.

## 2023-04-18 NOTE — Op Note (Signed)
 Indications: Nathan Rowland is a 43 yo male who presented with M54.16 lumbar radiculopathy, R29.898 left leg weakness. Due to ongoing weakness, surgery was recommended.  Findings: disc herniation  Preoperative Diagnosis: M54.16 lumbar radiculopathy, R29.898 left leg weakness  Postoperative Diagnosis: same   EBL: 50 ml IVF: see anesthesia record Drains: none Disposition: Extubated and Stable to PACU Complications: none  No foley catheter was placed.   Preoperative Note:   Risks of surgery discussed include: infection, bleeding, stroke, coma, death, paralysis, CSF leak, nerve/spinal cord injury, numbness, tingling, weakness, complex regional pain syndrome, recurrent stenosis and/or disc herniation, vascular injury, development of instability, neck/back pain, need for further surgery, persistent symptoms, development of deformity, and the risks of anesthesia. They understood these risks and have agreed to proceed.  Operative Note:    The patient was then brought from the preoperative center with intravenous access established.  The patient underwent general anesthesia and endotracheal tube intubation, then was rotated on the [facility] table where all pressure points were appropriately padded.  An incision was marked with flouroscopy. The skin was then thoroughly cleansed.  Perioperative antibiotic prophylaxis was administered.  Sterile prep and drapes were then applied and a timeout was then observed.    Once this was complete a 2 cm incision was opened with the use of a #10 blade knife.  The paraspinus muscled on the left were subperiosteally dissected until the facet was visualized. Flouroscopy was used to confirm the level. A self-retaining retractor was placed.  The microscope was then sterilely brought into the field. Using a high speed drill, the left L5 lamina and medial facet were drilled until ligamentum flavum was visualized. After the laminoforaminotomy was performed, a curette was  used to dissect the ligamentum flavum until the epidural fat and dura were visualized. The ligamentum was then carefully removed with 2mm Kerrison punch and rongeurs.   Once the underlying dura was visualized a Penfield 4 was then used to dissect and expose the traversing nerve root.  Once this was identified a nerve root retractor was used to mobilize this medially.  The venous plexus was hemostased with Surgifoam and light bipolar use.  The disc bulge was dissected and then opened with a #15 blade knife. The extruded disc fragment was then dissected free with microsurgical instruments and removed with a micropituitary rongeur.  Once the thecal sac and nerve root were noted to be relaxed and under less tension the ball-tipped feeler and [facility] were passed along the foramen distally to to ensure no residual compression was noted.  With none noted attention was then turned to closure.  A Depo-Medrol soaked Gelfoam pledget was placed along the annulotomy.    Hemostasis was confirmed after removal of the gelfoam. The retractor was then removed and hemostasis achieved in the superficial tissues. The wound was copiously irrigated.  The fascial layer was reapproximated with the use of a 0- Vicryl suture.  Subcutaneous tissue layer was reapproximated using 2-0 Vicryl suture.  The skin was then cleansed and Dermabond was used to close the skin opening.  Patient was then rotated back to the preoperative bed awakened from anesthesia and taken to recovery all counts are correct in this case.  I performed the entire procedure with the assistance of Manning Charity PA as an Designer, television/film set. An assistant was required for this procedure due to the complexity.  The assistant provided assistance in tissue manipulation and suction, and was required for the successful and safe performance of the procedure. I  performed the critical portions of the procedure.    Venetia Night MD

## 2023-04-19 ENCOUNTER — Encounter: Payer: BC Managed Care – PPO | Admitting: Rehabilitative and Restorative Service Providers"

## 2023-04-19 ENCOUNTER — Encounter: Payer: Self-pay | Admitting: Neurosurgery

## 2023-04-19 NOTE — Telephone Encounter (Signed)
 I spoke with Nathan Rowland. He is aware to do 40mg  of lovenox for the first week and then switch to 150mg . He feels comfortable with how to proceed with this. I instructed him to call us with any questions or concerns.

## 2023-04-19 NOTE — Anesthesia Postprocedure Evaluation (Signed)
 Anesthesia Post Note  Patient: Nathan Rowland  Procedure(s) Performed: LEFT L5-S1 MICRODISCECTOMY (Left)  Patient location during evaluation: PACU Anesthesia Type: General Level of consciousness: awake and alert Pain management: pain level controlled Vital Signs Assessment: post-procedure vital signs reviewed and stable Respiratory status: spontaneous breathing, nonlabored ventilation, respiratory function stable and patient connected to nasal cannula oxygen Cardiovascular status: blood pressure returned to baseline and stable Postop Assessment: no apparent nausea or vomiting Anesthetic complications: no   No notable events documented.   Last Vitals:  Vitals:   04/18/23 1127 04/18/23 1133  BP:  124/75  Pulse: (!) 101 (!) 118  Resp: 10   Temp: 36.4 C 37 C  SpO2: 91% 93%    Last Pain:  Vitals:   04/18/23 1133  TempSrc: Temporal  PainSc: 2                  Lenard Simmer

## 2023-04-21 ENCOUNTER — Telehealth: Payer: Self-pay | Admitting: Neurosurgery

## 2023-04-21 DIAGNOSIS — M5416 Radiculopathy, lumbar region: Secondary | ICD-10-CM

## 2023-04-21 DIAGNOSIS — Z9889 Other specified postprocedural states: Secondary | ICD-10-CM

## 2023-04-21 MED ORDER — METHYLPREDNISOLONE 4 MG PO TBPK
ORAL_TABLET | ORAL | 0 refills | Status: DC
Start: 2023-04-21 — End: 2023-05-03

## 2023-04-21 NOTE — Telephone Encounter (Signed)
 Patient is calling to let our office know that he is having pain that stems from his left lower back through his left calf that has gotten sharper since surgery. He rates the pain at 4-5/10 and states that it is worse with sitting but also comes on randomly. He would like to know what he needs to do.

## 2023-04-21 NOTE — Telephone Encounter (Signed)
 Patient indicates understanding to Stacy's remarks. Patient notified about Medrol dose pack script being sent in.

## 2023-04-21 NOTE — Telephone Encounter (Signed)
 I called [name], he states that he is having pain that goes from left lower side that radiates through the hip, down the leg to the ankle. He states this pain is not new since surgery, it is worsened since surgery on 2/17.   He denies any redness, swelling, tenderness in his calf. He is taking the Lovenox as prescribed.   I informed him that I would discuss with one of our PA's to see if he could benefit from a medrol dose pack.

## 2023-04-21 NOTE — Telephone Encounter (Signed)
 DOS:  04/18/23  left L5-S1 microdiscectomy  Let him know it's not uncommon to still have leg pain or even have increased leg pain after the surgery. This is generally due to swelling and inflammation from the surgery.   Okay to add medrol dose pack to help with symptoms. This was sent to [facility] in [place].   Please let him know.

## 2023-04-25 ENCOUNTER — Encounter: Payer: BC Managed Care – PPO | Admitting: Rehabilitative and Restorative Service Providers"

## 2023-05-03 ENCOUNTER — Encounter: Payer: Self-pay | Admitting: Neurosurgery

## 2023-05-03 ENCOUNTER — Ambulatory Visit (INDEPENDENT_AMBULATORY_CARE_PROVIDER_SITE_OTHER): Payer: BC Managed Care – PPO | Admitting: Neurosurgery

## 2023-05-03 ENCOUNTER — Encounter: Payer: BC Managed Care – PPO | Admitting: Rehabilitative and Restorative Service Providers"

## 2023-05-03 VITALS — BP 122/76 | Temp 97.7°F | Ht 72.0 in | Wt 245.0 lb

## 2023-05-03 DIAGNOSIS — Z9889 Other specified postprocedural states: Secondary | ICD-10-CM

## 2023-05-03 NOTE — Progress Notes (Signed)
   REFERRING PHYSICIAN:  Joaquim Nam, Md [address] [place],  [place] [address]  DOS: 04/18/23 left L5-S1 microdiscectomy  HISTORY OF PRESENT ILLNESS: Nathan Rowland is approximately 2 weeks status post lumbar discectomy.  He did have some ongoing leg pain after surgery and was given a Medrol Dosepak.  he is doing well with complete resolution of his preoperative leg pain.  PHYSICAL EXAMINATION:  General: Patient is well developed, well nourished, calm, collected, and in no apparent distress.   NEUROLOGICAL:  General: In no acute distress.   Awake, alert, oriented to person, place, and time.  Pupils equal round and reactive to light.  Facial tone is symmetric.    Strength:            Side Iliopsoas Quads Hamstring PF DF EHL  R 5 5 5 5 5 5   L 5 5 5  5* 5 5*   Incision c/d/I and healing well  ROS (Neurologic):  Negative except as noted above  IMAGING: No interval imaging to review today  ASSESSMENT/PLAN:  Nathan Rowland is doing well approximately 2 weeks after microdiscectomy.  We discussed activity escalation and I have advised the patient to lift up to 10 pounds until 6 weeks after surgery, then increase up to 25 pounds until 12 weeks after surgery.  After 12 weeks post-op, the patient advised to increase activity as tolerated. he will follow up with Dr. Myer Haff in 4 weeks or sooner should he have any questions or concerns.  He expressed understanding and was in agreement with this plan.  Advised to contact the office if any questions or concerns arise.  Manning Charity PA-C Department of neurosurgery

## 2023-05-04 DIAGNOSIS — J3081 Allergic rhinitis due to animal (cat) (dog) hair and dander: Secondary | ICD-10-CM | POA: Diagnosis not present

## 2023-05-04 DIAGNOSIS — J3089 Other allergic rhinitis: Secondary | ICD-10-CM | POA: Diagnosis not present

## 2023-05-04 DIAGNOSIS — J301 Allergic rhinitis due to pollen: Secondary | ICD-10-CM | POA: Diagnosis not present

## 2023-05-09 ENCOUNTER — Encounter: Payer: BC Managed Care – PPO | Admitting: Rehabilitative and Restorative Service Providers"

## 2023-05-17 ENCOUNTER — Encounter: Payer: BC Managed Care – PPO | Admitting: Rehabilitative and Restorative Service Providers"

## 2023-05-18 ENCOUNTER — Encounter: Payer: Self-pay | Admitting: Neurosurgery

## 2023-05-19 DIAGNOSIS — J3089 Other allergic rhinitis: Secondary | ICD-10-CM | POA: Diagnosis not present

## 2023-05-19 DIAGNOSIS — J3081 Allergic rhinitis due to animal (cat) (dog) hair and dander: Secondary | ICD-10-CM | POA: Diagnosis not present

## 2023-05-19 DIAGNOSIS — J301 Allergic rhinitis due to pollen: Secondary | ICD-10-CM | POA: Diagnosis not present

## 2023-05-23 ENCOUNTER — Encounter: Payer: BC Managed Care – PPO | Admitting: Rehabilitative and Restorative Service Providers"

## 2023-05-24 ENCOUNTER — Ambulatory Visit (INDEPENDENT_AMBULATORY_CARE_PROVIDER_SITE_OTHER): Payer: BC Managed Care – PPO | Admitting: Neurosurgery

## 2023-05-24 VITALS — BP 140/88 | Temp 98.3°F | Ht 72.0 in | Wt 245.0 lb

## 2023-05-24 DIAGNOSIS — Z9889 Other specified postprocedural states: Secondary | ICD-10-CM

## 2023-05-24 DIAGNOSIS — M5416 Radiculopathy, lumbar region: Secondary | ICD-10-CM

## 2023-05-24 NOTE — Progress Notes (Signed)
   REFERRING PHYSICIAN:  Joaquim Nam, Md [address] [place],  [place] [address]  DOS: 04/18/23 left L5-S1 microdiscectomy  HISTORY OF PRESENT ILLNESS: Nathan Rowland is status post lumbar discectomy.   He is doing pretty well with mild pain.  He is much better than preop.  PHYSICAL EXAMINATION:  General: Patient is well developed, well nourished, calm, collected, and in no apparent distress.   NEUROLOGICAL:  General: In no acute distress.   Awake, alert, oriented to person, place, and time.  Pupils equal round and reactive to light.  Facial tone is symmetric.    Strength:            Side Iliopsoas Quads Hamstring PF DF EHL  R 5 5 5 5 5 5   L 5 5 5 5 5 5    Incision c/d/I and healing well  ROS (Neurologic):  Negative except as noted above  IMAGING: No interval imaging to review today  ASSESSMENT/PLAN:  Nathan Rowland is doing well after microdiscectomy.  We discussed activity escalation.  Will see him back in clinic in 6 weeks.  I am very happy with his improvements.   Venetia Night MD Department of neurosurgery

## 2023-05-25 ENCOUNTER — Encounter: Payer: Self-pay | Admitting: Neurosurgery

## 2023-05-25 ENCOUNTER — Inpatient Hospital Stay: Payer: BC Managed Care – PPO | Attending: Hematology and Oncology

## 2023-05-25 ENCOUNTER — Other Ambulatory Visit: Payer: Self-pay | Admitting: *Deleted

## 2023-05-25 ENCOUNTER — Inpatient Hospital Stay: Payer: BC Managed Care – PPO | Admitting: Hematology and Oncology

## 2023-05-25 VITALS — BP 127/74 | HR 98 | Temp 98.3°F | Resp 16 | Wt 243.5 lb

## 2023-05-25 DIAGNOSIS — D509 Iron deficiency anemia, unspecified: Secondary | ICD-10-CM

## 2023-05-25 DIAGNOSIS — Z86711 Personal history of pulmonary embolism: Secondary | ICD-10-CM | POA: Diagnosis not present

## 2023-05-25 DIAGNOSIS — Z801 Family history of malignant neoplasm of trachea, bronchus and lung: Secondary | ICD-10-CM | POA: Insufficient documentation

## 2023-05-25 DIAGNOSIS — J3089 Other allergic rhinitis: Secondary | ICD-10-CM | POA: Diagnosis not present

## 2023-05-25 DIAGNOSIS — I81 Portal vein thrombosis: Secondary | ICD-10-CM | POA: Diagnosis not present

## 2023-05-25 DIAGNOSIS — K922 Gastrointestinal hemorrhage, unspecified: Secondary | ICD-10-CM | POA: Diagnosis not present

## 2023-05-25 DIAGNOSIS — D500 Iron deficiency anemia secondary to blood loss (chronic): Secondary | ICD-10-CM | POA: Insufficient documentation

## 2023-05-25 DIAGNOSIS — J3081 Allergic rhinitis due to animal (cat) (dog) hair and dander: Secondary | ICD-10-CM | POA: Diagnosis not present

## 2023-05-25 DIAGNOSIS — Z808 Family history of malignant neoplasm of other organs or systems: Secondary | ICD-10-CM | POA: Diagnosis not present

## 2023-05-25 DIAGNOSIS — J301 Allergic rhinitis due to pollen: Secondary | ICD-10-CM | POA: Diagnosis not present

## 2023-05-25 LAB — RETIC PANEL
Immature Retic Fract: 3 % (ref 2.3–15.9)
RBC.: 5.03 MIL/uL (ref 4.22–5.81)
Retic Count, Absolute: 62.9 10*3/uL (ref 19.0–186.0)
Retic Ct Pct: 1.3 % (ref 0.4–3.1)
Reticulocyte Hemoglobin: 33.4 pg (ref >27.9)

## 2023-05-25 LAB — CMP (CANCER CENTER ONLY)
ALT: 20 U/L (ref 0–44)
AST: 16 U/L (ref 15–41)
Albumin: 4.3 g/dL (ref 3.5–5.0)
Alkaline Phosphatase: 83 U/L (ref 38–126)
Anion gap: 8 (ref 5–15)
BUN: 9 mg/dL (ref 6–20)
CO2: 25 mmol/L (ref 22–32)
Calcium: 9.4 mg/dL (ref 8.9–10.3)
Chloride: 106 mmol/L (ref 98–111)
Creatinine: 0.98 mg/dL (ref 0.61–1.24)
GFR, Estimated: >60 mL/min (ref >60)
Glucose, Bld: 128 mg/dL — ABNORMAL HIGH (ref 70–99)
Potassium: 3.8 mmol/L (ref 3.5–5.1)
Sodium: 139 mmol/L (ref 135–145)
Total Bilirubin: 0.8 mg/dL (ref 0.0–1.2)
Total Protein: 7.2 g/dL (ref 6.5–8.1)

## 2023-05-25 LAB — CBC WITH DIFFERENTIAL (CANCER CENTER ONLY)
Abs Immature Granulocytes: 0.03 10*3/uL (ref 0.00–0.07)
Basophils Absolute: 0 10*3/uL (ref 0.0–0.1)
Basophils Relative: 0 %
Eosinophils Absolute: 0 10*3/uL (ref 0.0–0.5)
Eosinophils Relative: 0 %
HCT: 45.4 % (ref 39.0–52.0)
Hemoglobin: 15.5 g/dL (ref 13.0–17.0)
Immature Granulocytes: 0 %
Lymphocytes Relative: 20 %
Lymphs Abs: 2 10*3/uL (ref 0.7–4.0)
MCH: 30.3 pg (ref 26.0–34.0)
MCHC: 34.1 g/dL (ref 30.0–36.0)
MCV: 88.7 fL (ref 80.0–100.0)
Monocytes Absolute: 0.6 10*3/uL (ref 0.1–1.0)
Monocytes Relative: 6 %
Neutro Abs: 7.4 10*3/uL (ref 1.7–7.7)
Neutrophils Relative %: 74 %
Platelet Count: 220 10*3/uL (ref 150–400)
RBC: 5.12 MIL/uL (ref 4.22–5.81)
RDW: 13.2 % (ref 11.5–15.5)
WBC Count: 10 10*3/uL (ref 4.0–10.5)
nRBC: 0 % (ref 0.0–0.2)

## 2023-05-25 LAB — IRON AND IRON BINDING CAPACITY (CC-WL,HP ONLY)
Iron: 118 ug/dL (ref 45–182)
Saturation Ratios: 38 % (ref 17.9–39.5)
TIBC: 311 ug/dL (ref 250–450)
UIBC: 193 ug/dL (ref 117–376)

## 2023-05-25 MED ORDER — APIXABAN 5 MG PO TABS: 5.0000 mg | ORAL_TABLET | Freq: Two times a day (BID) | ORAL | 2 refills | Status: AC
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
## 2023-05-26 ENCOUNTER — Encounter: Payer: BC Managed Care – PPO | Admitting: Neurosurgery

## 2023-05-26 LAB — FERRITIN: Ferritin: 110 ng/mL (ref 24–336)

## 2023-05-31 ENCOUNTER — Encounter: Payer: BC Managed Care – PPO | Admitting: Rehabilitative and Restorative Service Providers"

## 2023-06-01 DIAGNOSIS — J3081 Allergic rhinitis due to animal (cat) (dog) hair and dander: Secondary | ICD-10-CM | POA: Diagnosis not present

## 2023-06-01 DIAGNOSIS — J3089 Other allergic rhinitis: Secondary | ICD-10-CM | POA: Diagnosis not present

## 2023-06-01 DIAGNOSIS — J301 Allergic rhinitis due to pollen: Secondary | ICD-10-CM | POA: Diagnosis not present

## 2023-06-07 DIAGNOSIS — J301 Allergic rhinitis due to pollen: Secondary | ICD-10-CM | POA: Diagnosis not present

## 2023-06-07 DIAGNOSIS — J3089 Other allergic rhinitis: Secondary | ICD-10-CM | POA: Diagnosis not present

## 2023-06-07 DIAGNOSIS — H1045 Other chronic allergic conjunctivitis: Secondary | ICD-10-CM | POA: Diagnosis not present

## 2023-06-07 DIAGNOSIS — J3081 Allergic rhinitis due to animal (cat) (dog) hair and dander: Secondary | ICD-10-CM | POA: Diagnosis not present

## 2023-06-15 DIAGNOSIS — J301 Allergic rhinitis due to pollen: Secondary | ICD-10-CM | POA: Diagnosis not present

## 2023-06-15 DIAGNOSIS — J3081 Allergic rhinitis due to animal (cat) (dog) hair and dander: Secondary | ICD-10-CM | POA: Diagnosis not present

## 2023-06-15 DIAGNOSIS — J3089 Other allergic rhinitis: Secondary | ICD-10-CM | POA: Diagnosis not present

## 2023-06-16 ENCOUNTER — Other Ambulatory Visit: Payer: Self-pay | Admitting: Interventional Radiology

## 2023-06-16 DIAGNOSIS — I81 Portal vein thrombosis: Secondary | ICD-10-CM

## 2023-06-28 DIAGNOSIS — J301 Allergic rhinitis due to pollen: Secondary | ICD-10-CM | POA: Diagnosis not present

## 2023-06-28 DIAGNOSIS — J3089 Other allergic rhinitis: Secondary | ICD-10-CM | POA: Diagnosis not present

## 2023-06-28 DIAGNOSIS — J3081 Allergic rhinitis due to animal (cat) (dog) hair and dander: Secondary | ICD-10-CM | POA: Diagnosis not present

## 2023-06-29 ENCOUNTER — Ambulatory Visit
Admission: RE | Admit: 2023-06-29 | Discharge: 2023-06-29 | Disposition: A | Discharge status: Home / Self Care | Source: Ambulatory Visit | Attending: Interventional Radiology | Admitting: Interventional Radiology

## 2023-06-29 DIAGNOSIS — I81 Portal vein thrombosis: Secondary | ICD-10-CM | POA: Diagnosis not present

## 2023-06-29 DIAGNOSIS — Z95828 Presence of other vascular implants and grafts: Secondary | ICD-10-CM | POA: Diagnosis not present

## 2023-06-29 HISTORY — PX: IR RADIOLOGIST EVAL & MGMT: IMG5224
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
## 2023-07-06 DIAGNOSIS — J3089 Other allergic rhinitis: Secondary | ICD-10-CM | POA: Diagnosis not present

## 2023-07-06 DIAGNOSIS — J301 Allergic rhinitis due to pollen: Secondary | ICD-10-CM | POA: Diagnosis not present

## 2023-07-06 DIAGNOSIS — J3081 Allergic rhinitis due to animal (cat) (dog) hair and dander: Secondary | ICD-10-CM | POA: Diagnosis not present

## 2023-07-07 ENCOUNTER — Ambulatory Visit: Payer: BC Managed Care – PPO | Admitting: Neurosurgery

## 2023-07-07 ENCOUNTER — Encounter: Payer: Self-pay | Admitting: Neurosurgery

## 2023-07-07 VITALS — BP 138/82 | Temp 98.3°F | Ht 72.0 in | Wt 243.0 lb

## 2023-07-07 DIAGNOSIS — Z9889 Other specified postprocedural states: Secondary | ICD-10-CM

## 2023-07-07 DIAGNOSIS — M5416 Radiculopathy, lumbar region: Secondary | ICD-10-CM

## 2023-07-07 NOTE — Progress Notes (Signed)
   REFERRING PHYSICIAN:  Donnie Galea, Md [address] [place],  [place] [address]  DOS: 04/18/23 left L5-S1 microdiscectomy  HISTORY OF PRESENT ILLNESS:  07/07/23 Nathan Rowland is a 43 year old presenting today for 3 month follow up. He continues to have some discomfort in his back and left ankle but attributes some of this to increasing his activity. 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   05/24/23 Nathan Rowland is status post lumbar discectomy.   He is doing pretty well with mild pain.  He is much better than preop.  PHYSICAL EXAMINATION:  General: Patient is well developed, well nourished, calm, collected, and in no apparent distress.   NEUROLOGICAL:  General: In no acute distress.   Awake, alert, oriented to person, place, and time.  Pupils equal round and reactive to light.  Facial tone is symmetric.    Strength:            Side Iliopsoas Quads Hamstring PF DF EHL  R 5 5 5 5 5 5   L 5 5 5 5 5 5    Incision well healed   ROS (Neurologic):  Negative except as noted above  IMAGING: No interval imaging to review today  ASSESSMENT/PLAN:  Nathan Rowland is doing well after microdiscectomy.  We discussed that an FCE might be warranted to evaluate his job description and ability to resume his job fully. I have sent him to information regarding this via mychart and will send a referral once I hear back from him. We will otherwise see him going forward on an as needed basis. He expressed understanding and was in agreement with this plan.   Nathan Karvonen PA-C Department of neurosurgery

## 2023-07-08 ENCOUNTER — Other Ambulatory Visit: Payer: Self-pay | Admitting: Neurosurgery

## 2023-07-08 DIAGNOSIS — M5416 Radiculopathy, lumbar region: Secondary | ICD-10-CM

## 2023-07-08 DIAGNOSIS — Z9889 Other specified postprocedural states: Secondary | ICD-10-CM

## 2023-07-13 DIAGNOSIS — J3081 Allergic rhinitis due to animal (cat) (dog) hair and dander: Secondary | ICD-10-CM | POA: Diagnosis not present

## 2023-07-13 DIAGNOSIS — J3089 Other allergic rhinitis: Secondary | ICD-10-CM | POA: Diagnosis not present

## 2023-07-13 DIAGNOSIS — J301 Allergic rhinitis due to pollen: Secondary | ICD-10-CM | POA: Diagnosis not present

## 2023-07-18 NOTE — Telephone Encounter (Signed)
 Patient came by the office. He just completed his FCE at [employer]. Once the results come in please sign his Clearance for Duty Form.

## 2023-07-20 DIAGNOSIS — J3081 Allergic rhinitis due to animal (cat) (dog) hair and dander: Secondary | ICD-10-CM | POA: Diagnosis not present

## 2023-07-20 DIAGNOSIS — J301 Allergic rhinitis due to pollen: Secondary | ICD-10-CM | POA: Diagnosis not present

## 2023-07-20 NOTE — Telephone Encounter (Signed)
 His FCE is scanned into his chart. I have the form at my desk that needs to be signed by the physician. Patient is ready to go back to work asap.

## 2023-07-21 DIAGNOSIS — J301 Allergic rhinitis due to pollen: Secondary | ICD-10-CM | POA: Diagnosis not present

## 2023-07-21 DIAGNOSIS — J3081 Allergic rhinitis due to animal (cat) (dog) hair and dander: Secondary | ICD-10-CM | POA: Diagnosis not present

## 2023-07-21 DIAGNOSIS — J3089 Other allergic rhinitis: Secondary | ICD-10-CM | POA: Diagnosis not present

## 2023-07-21 NOTE — Telephone Encounter (Signed)
 It only requires a physician's signature. Would you rather Dr.Y sign it?

## 2023-07-21 NOTE — Telephone Encounter (Signed)
 Patient picked up form

## 2023-07-27 DIAGNOSIS — J3081 Allergic rhinitis due to animal (cat) (dog) hair and dander: Secondary | ICD-10-CM | POA: Diagnosis not present

## 2023-07-27 DIAGNOSIS — J301 Allergic rhinitis due to pollen: Secondary | ICD-10-CM | POA: Diagnosis not present

## 2023-07-27 DIAGNOSIS — J3089 Other allergic rhinitis: Secondary | ICD-10-CM | POA: Diagnosis not present

## 2023-07-29 ENCOUNTER — Other Ambulatory Visit (HOSPITAL_COMMUNITY): Payer: Self-pay

## 2023-08-03 DIAGNOSIS — J3081 Allergic rhinitis due to animal (cat) (dog) hair and dander: Secondary | ICD-10-CM | POA: Diagnosis not present

## 2023-08-03 DIAGNOSIS — J3089 Other allergic rhinitis: Secondary | ICD-10-CM | POA: Diagnosis not present

## 2023-08-03 DIAGNOSIS — J301 Allergic rhinitis due to pollen: Secondary | ICD-10-CM | POA: Diagnosis not present

## 2023-08-10 DIAGNOSIS — J301 Allergic rhinitis due to pollen: Secondary | ICD-10-CM | POA: Diagnosis not present

## 2023-08-10 DIAGNOSIS — J3089 Other allergic rhinitis: Secondary | ICD-10-CM | POA: Diagnosis not present

## 2023-08-10 DIAGNOSIS — J3081 Allergic rhinitis due to animal (cat) (dog) hair and dander: Secondary | ICD-10-CM | POA: Diagnosis not present

## 2023-08-17 DIAGNOSIS — J3089 Other allergic rhinitis: Secondary | ICD-10-CM | POA: Diagnosis not present

## 2023-08-17 DIAGNOSIS — J301 Allergic rhinitis due to pollen: Secondary | ICD-10-CM | POA: Diagnosis not present

## 2023-08-17 DIAGNOSIS — J3081 Allergic rhinitis due to animal (cat) (dog) hair and dander: Secondary | ICD-10-CM | POA: Diagnosis not present

## 2023-09-06 DIAGNOSIS — J301 Allergic rhinitis due to pollen: Secondary | ICD-10-CM | POA: Diagnosis not present

## 2023-09-06 DIAGNOSIS — J3089 Other allergic rhinitis: Secondary | ICD-10-CM | POA: Diagnosis not present

## 2023-09-06 DIAGNOSIS — J3081 Allergic rhinitis due to animal (cat) (dog) hair and dander: Secondary | ICD-10-CM | POA: Diagnosis not present

## 2023-09-22 DIAGNOSIS — J3081 Allergic rhinitis due to animal (cat) (dog) hair and dander: Secondary | ICD-10-CM | POA: Diagnosis not present

## 2023-09-22 DIAGNOSIS — J3089 Other allergic rhinitis: Secondary | ICD-10-CM | POA: Diagnosis not present

## 2023-09-22 DIAGNOSIS — J301 Allergic rhinitis due to pollen: Secondary | ICD-10-CM | POA: Diagnosis not present

## 2023-10-13 DIAGNOSIS — J3089 Other allergic rhinitis: Secondary | ICD-10-CM | POA: Diagnosis not present

## 2023-10-13 DIAGNOSIS — J3081 Allergic rhinitis due to animal (cat) (dog) hair and dander: Secondary | ICD-10-CM | POA: Diagnosis not present

## 2023-10-13 DIAGNOSIS — J301 Allergic rhinitis due to pollen: Secondary | ICD-10-CM | POA: Diagnosis not present

## 2023-10-21 DIAGNOSIS — J301 Allergic rhinitis due to pollen: Secondary | ICD-10-CM | POA: Diagnosis not present

## 2023-10-21 DIAGNOSIS — J3089 Other allergic rhinitis: Secondary | ICD-10-CM | POA: Diagnosis not present

## 2023-10-21 DIAGNOSIS — J3081 Allergic rhinitis due to animal (cat) (dog) hair and dander: Secondary | ICD-10-CM | POA: Diagnosis not present

## 2023-10-28 DIAGNOSIS — J3089 Other allergic rhinitis: Secondary | ICD-10-CM | POA: Diagnosis not present

## 2023-10-28 DIAGNOSIS — J301 Allergic rhinitis due to pollen: Secondary | ICD-10-CM | POA: Diagnosis not present

## 2023-10-28 DIAGNOSIS — J3081 Allergic rhinitis due to animal (cat) (dog) hair and dander: Secondary | ICD-10-CM | POA: Diagnosis not present

## 2023-11-04 DIAGNOSIS — J301 Allergic rhinitis due to pollen: Secondary | ICD-10-CM | POA: Diagnosis not present

## 2023-11-04 DIAGNOSIS — J3081 Allergic rhinitis due to animal (cat) (dog) hair and dander: Secondary | ICD-10-CM | POA: Diagnosis not present

## 2023-11-04 DIAGNOSIS — J3089 Other allergic rhinitis: Secondary | ICD-10-CM | POA: Diagnosis not present

## 2023-11-11 DIAGNOSIS — J3089 Other allergic rhinitis: Secondary | ICD-10-CM | POA: Diagnosis not present

## 2023-11-11 DIAGNOSIS — J301 Allergic rhinitis due to pollen: Secondary | ICD-10-CM | POA: Diagnosis not present

## 2023-11-11 DIAGNOSIS — J3081 Allergic rhinitis due to animal (cat) (dog) hair and dander: Secondary | ICD-10-CM | POA: Diagnosis not present

## 2023-11-16 ENCOUNTER — Inpatient Hospital Stay

## 2023-11-16 ENCOUNTER — Inpatient Hospital Stay: Admitting: Hematology and Oncology

## 2023-11-16 ENCOUNTER — Inpatient Hospital Stay: Attending: Hematology and Oncology

## 2023-11-16 ENCOUNTER — Telehealth: Payer: Self-pay | Admitting: Hematology and Oncology

## 2023-11-16 DIAGNOSIS — K922 Gastrointestinal hemorrhage, unspecified: Secondary | ICD-10-CM | POA: Diagnosis not present

## 2023-11-16 DIAGNOSIS — D509 Iron deficiency anemia, unspecified: Secondary | ICD-10-CM

## 2023-11-16 DIAGNOSIS — J3081 Allergic rhinitis due to animal (cat) (dog) hair and dander: Secondary | ICD-10-CM | POA: Diagnosis not present

## 2023-11-16 DIAGNOSIS — D500 Iron deficiency anemia secondary to blood loss (chronic): Secondary | ICD-10-CM | POA: Insufficient documentation

## 2023-11-16 DIAGNOSIS — J301 Allergic rhinitis due to pollen: Secondary | ICD-10-CM | POA: Diagnosis not present

## 2023-11-16 DIAGNOSIS — J3089 Other allergic rhinitis: Secondary | ICD-10-CM | POA: Diagnosis not present

## 2023-11-16 DIAGNOSIS — I81 Portal vein thrombosis: Secondary | ICD-10-CM

## 2023-11-16 LAB — CBC WITH DIFFERENTIAL (CANCER CENTER ONLY)
Abs Immature Granulocytes: 0.01 K/uL (ref 0.00–0.07)
Basophils Absolute: 0 K/uL (ref 0.0–0.1)
Basophils Relative: 0 %
Eosinophils Absolute: 0 K/uL (ref 0.0–0.5)
Eosinophils Relative: 0 %
HCT: 44.2 % (ref 39.0–52.0)
Hemoglobin: 15.4 g/dL (ref 13.0–17.0)
Immature Granulocytes: 0 %
Lymphocytes Relative: 26 %
Lymphs Abs: 1.9 K/uL (ref 0.7–4.0)
MCH: 30.7 pg (ref 26.0–34.0)
MCHC: 34.8 g/dL (ref 30.0–36.0)
MCV: 88.2 fL (ref 80.0–100.0)
Monocytes Absolute: 0.5 K/uL (ref 0.1–1.0)
Monocytes Relative: 7 %
Neutro Abs: 4.9 K/uL (ref 1.7–7.7)
Neutrophils Relative %: 67 %
Platelet Count: 181 K/uL (ref 150–400)
RBC: 5.01 MIL/uL (ref 4.22–5.81)
RDW: 13.5 % (ref 11.5–15.5)
WBC Count: 7.3 K/uL (ref 4.0–10.5)
nRBC: 0 % (ref 0.0–0.2)

## 2023-11-16 LAB — IRON AND IRON BINDING CAPACITY (CC-WL,HP ONLY)
Iron: 100 ug/dL (ref 45–182)
Saturation Ratios: 34 % (ref 17.9–39.5)
TIBC: 294 ug/dL (ref 250–450)
UIBC: 194 ug/dL (ref 117–376)

## 2023-11-16 LAB — CMP (CANCER CENTER ONLY)
ALT: 23 U/L (ref 0–44)
AST: 19 U/L (ref 15–41)
Albumin: 4.1 g/dL (ref 3.5–5.0)
Alkaline Phosphatase: 73 U/L (ref 38–126)
Anion gap: 5 (ref 5–15)
BUN: 10 mg/dL (ref 6–20)
CO2: 28 mmol/L (ref 22–32)
Calcium: 9 mg/dL (ref 8.9–10.3)
Chloride: 109 mmol/L (ref 98–111)
Creatinine: 1.02 mg/dL (ref 0.61–1.24)
GFR, Estimated: >60 mL/min (ref >60)
Glucose, Bld: 125 mg/dL — ABNORMAL HIGH (ref 70–99)
Potassium: 3.7 mmol/L (ref 3.5–5.1)
Sodium: 142 mmol/L (ref 135–145)
Total Bilirubin: 1 mg/dL (ref 0.0–1.2)
Total Protein: 6.9 g/dL (ref 6.5–8.1)

## 2023-11-16 LAB — FERRITIN: Ferritin: 162 ng/mL (ref 24–336)
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
## 2023-11-23 ENCOUNTER — Ambulatory Visit: Admitting: Hematology and Oncology

## 2023-11-23 ENCOUNTER — Other Ambulatory Visit

## 2023-11-24 DIAGNOSIS — J3089 Other allergic rhinitis: Secondary | ICD-10-CM | POA: Diagnosis not present

## 2023-11-24 DIAGNOSIS — J301 Allergic rhinitis due to pollen: Secondary | ICD-10-CM | POA: Diagnosis not present

## 2023-11-25 ENCOUNTER — Inpatient Hospital Stay (HOSPITAL_BASED_OUTPATIENT_CLINIC_OR_DEPARTMENT_OTHER): Admitting: Physician Assistant

## 2023-11-25 DIAGNOSIS — D509 Iron deficiency anemia, unspecified: Secondary | ICD-10-CM

## 2023-11-25 DIAGNOSIS — I81 Portal vein thrombosis: Secondary | ICD-10-CM | POA: Diagnosis not present

## 2023-11-28 ENCOUNTER — Telehealth: Payer: Self-pay | Admitting: Hematology and Oncology

## 2023-11-28 NOTE — Telephone Encounter (Signed)
 Scheduled appointments with patient per 11/25/23 los notes.

## 2023-12-06 DIAGNOSIS — J3089 Other allergic rhinitis: Secondary | ICD-10-CM | POA: Diagnosis not present

## 2023-12-06 DIAGNOSIS — J3081 Allergic rhinitis due to animal (cat) (dog) hair and dander: Secondary | ICD-10-CM | POA: Diagnosis not present

## 2023-12-06 DIAGNOSIS — J301 Allergic rhinitis due to pollen: Secondary | ICD-10-CM | POA: Diagnosis not present

## 2023-12-16 DIAGNOSIS — J3089 Other allergic rhinitis: Secondary | ICD-10-CM | POA: Diagnosis not present

## 2023-12-16 DIAGNOSIS — J3081 Allergic rhinitis due to animal (cat) (dog) hair and dander: Secondary | ICD-10-CM | POA: Diagnosis not present

## 2023-12-16 DIAGNOSIS — J301 Allergic rhinitis due to pollen: Secondary | ICD-10-CM | POA: Diagnosis not present

## 2023-12-29 DIAGNOSIS — J3089 Other allergic rhinitis: Secondary | ICD-10-CM | POA: Diagnosis not present

## 2023-12-29 DIAGNOSIS — J3081 Allergic rhinitis due to animal (cat) (dog) hair and dander: Secondary | ICD-10-CM | POA: Diagnosis not present

## 2023-12-29 DIAGNOSIS — J301 Allergic rhinitis due to pollen: Secondary | ICD-10-CM | POA: Diagnosis not present

## 2024-01-05 DIAGNOSIS — J3089 Other allergic rhinitis: Secondary | ICD-10-CM | POA: Diagnosis not present

## 2024-01-05 DIAGNOSIS — J3081 Allergic rhinitis due to animal (cat) (dog) hair and dander: Secondary | ICD-10-CM | POA: Diagnosis not present

## 2024-01-05 DIAGNOSIS — J301 Allergic rhinitis due to pollen: Secondary | ICD-10-CM | POA: Diagnosis not present

## 2024-01-23 DIAGNOSIS — J301 Allergic rhinitis due to pollen: Secondary | ICD-10-CM | POA: Diagnosis not present

## 2024-01-23 DIAGNOSIS — J3089 Other allergic rhinitis: Secondary | ICD-10-CM | POA: Diagnosis not present

## 2024-01-23 DIAGNOSIS — J3081 Allergic rhinitis due to animal (cat) (dog) hair and dander: Secondary | ICD-10-CM | POA: Diagnosis not present

## 2024-01-30 DIAGNOSIS — J3089 Other allergic rhinitis: Secondary | ICD-10-CM | POA: Diagnosis not present

## 2024-01-30 DIAGNOSIS — J301 Allergic rhinitis due to pollen: Secondary | ICD-10-CM | POA: Diagnosis not present

## 2024-01-30 DIAGNOSIS — J3081 Allergic rhinitis due to animal (cat) (dog) hair and dander: Secondary | ICD-10-CM | POA: Diagnosis not present

## 2024-02-08 DIAGNOSIS — J3081 Allergic rhinitis due to animal (cat) (dog) hair and dander: Secondary | ICD-10-CM | POA: Diagnosis not present

## 2024-02-08 DIAGNOSIS — J301 Allergic rhinitis due to pollen: Secondary | ICD-10-CM | POA: Diagnosis not present

## 2024-02-08 DIAGNOSIS — J3089 Other allergic rhinitis: Secondary | ICD-10-CM | POA: Diagnosis not present

## 2024-05-28 ENCOUNTER — Ambulatory Visit: Admitting: Hematology and Oncology

## 2024-05-28 ENCOUNTER — Other Ambulatory Visit
